# Patient Record
Sex: Male | Born: 1945 | Race: Black or African American | Hispanic: No | Marital: Single | Smoking: Former smoker
Health system: Southern US, Community
[De-identification: ages and names within clinical notes are randomized; demographics above are authoritative.]

## PROBLEM LIST (undated history)

## (undated) DIAGNOSIS — I10 Essential (primary) hypertension: Secondary | ICD-10-CM

## (undated) DIAGNOSIS — F209 Schizophrenia, unspecified: Secondary | ICD-10-CM

## (undated) DIAGNOSIS — E785 Hyperlipidemia, unspecified: Secondary | ICD-10-CM

## (undated) DIAGNOSIS — I219 Acute myocardial infarction, unspecified: Secondary | ICD-10-CM

## (undated) DIAGNOSIS — I6529 Occlusion and stenosis of unspecified carotid artery: Secondary | ICD-10-CM

## (undated) DIAGNOSIS — I2510 Atherosclerotic heart disease of native coronary artery without angina pectoris: Secondary | ICD-10-CM

## (undated) HISTORY — DX: Essential (primary) hypertension: I10

## (undated) HISTORY — PX: HERNIA REPAIR: SHX51

## (undated) HISTORY — DX: Hyperlipidemia, unspecified: E78.5

## (undated) HISTORY — PX: TONSILLECTOMY AND ADENOIDECTOMY: SHX28

## (undated) HISTORY — PX: CARDIAC CATHETERIZATION: SHX172

## (undated) HISTORY — DX: Schizophrenia, unspecified: F20.9

## (undated) HISTORY — DX: Atherosclerotic heart disease of native coronary artery without angina pectoris: I25.10

## (undated) HISTORY — DX: Occlusion and stenosis of unspecified carotid artery: I65.29

---

## 2005-03-27 HISTORY — PX: CORONARY ARTERY BYPASS GRAFT: SHX141

## 2006-02-02 ENCOUNTER — Inpatient Hospital Stay (HOSPITAL_BASED_OUTPATIENT_CLINIC_OR_DEPARTMENT_OTHER): Admission: RE | Admit: 2006-02-02 | Discharge: 2006-02-02 | Payer: Self-pay | Admitting: Cardiology

## 2006-02-08 ENCOUNTER — Encounter: Payer: Self-pay | Admitting: Vascular Surgery

## 2006-02-08 ENCOUNTER — Ambulatory Visit (HOSPITAL_COMMUNITY): Admission: RE | Admit: 2006-02-08 | Discharge: 2006-02-08 | Payer: Self-pay | Admitting: Cardiothoracic Surgery

## 2006-02-09 ENCOUNTER — Emergency Department (HOSPITAL_COMMUNITY): Admission: EM | Admit: 2006-02-09 | Discharge: 2006-02-09 | Payer: Self-pay | Admitting: Emergency Medicine

## 2006-02-13 ENCOUNTER — Inpatient Hospital Stay (HOSPITAL_COMMUNITY): Admission: RE | Admit: 2006-02-13 | Discharge: 2006-02-21 | Payer: Self-pay | Admitting: Cardiothoracic Surgery

## 2007-06-25 ENCOUNTER — Emergency Department (HOSPITAL_COMMUNITY): Admission: EM | Admit: 2007-06-25 | Discharge: 2007-06-26 | Payer: Self-pay | Admitting: Emergency Medicine

## 2007-11-07 IMAGING — CR DG CHEST 1V PORT
1 series · 1 of 1 positions shown · non-contrast
Comparison: 02/14/2006

CLINICAL DATA: Coronary artery disease

[view not recorded]
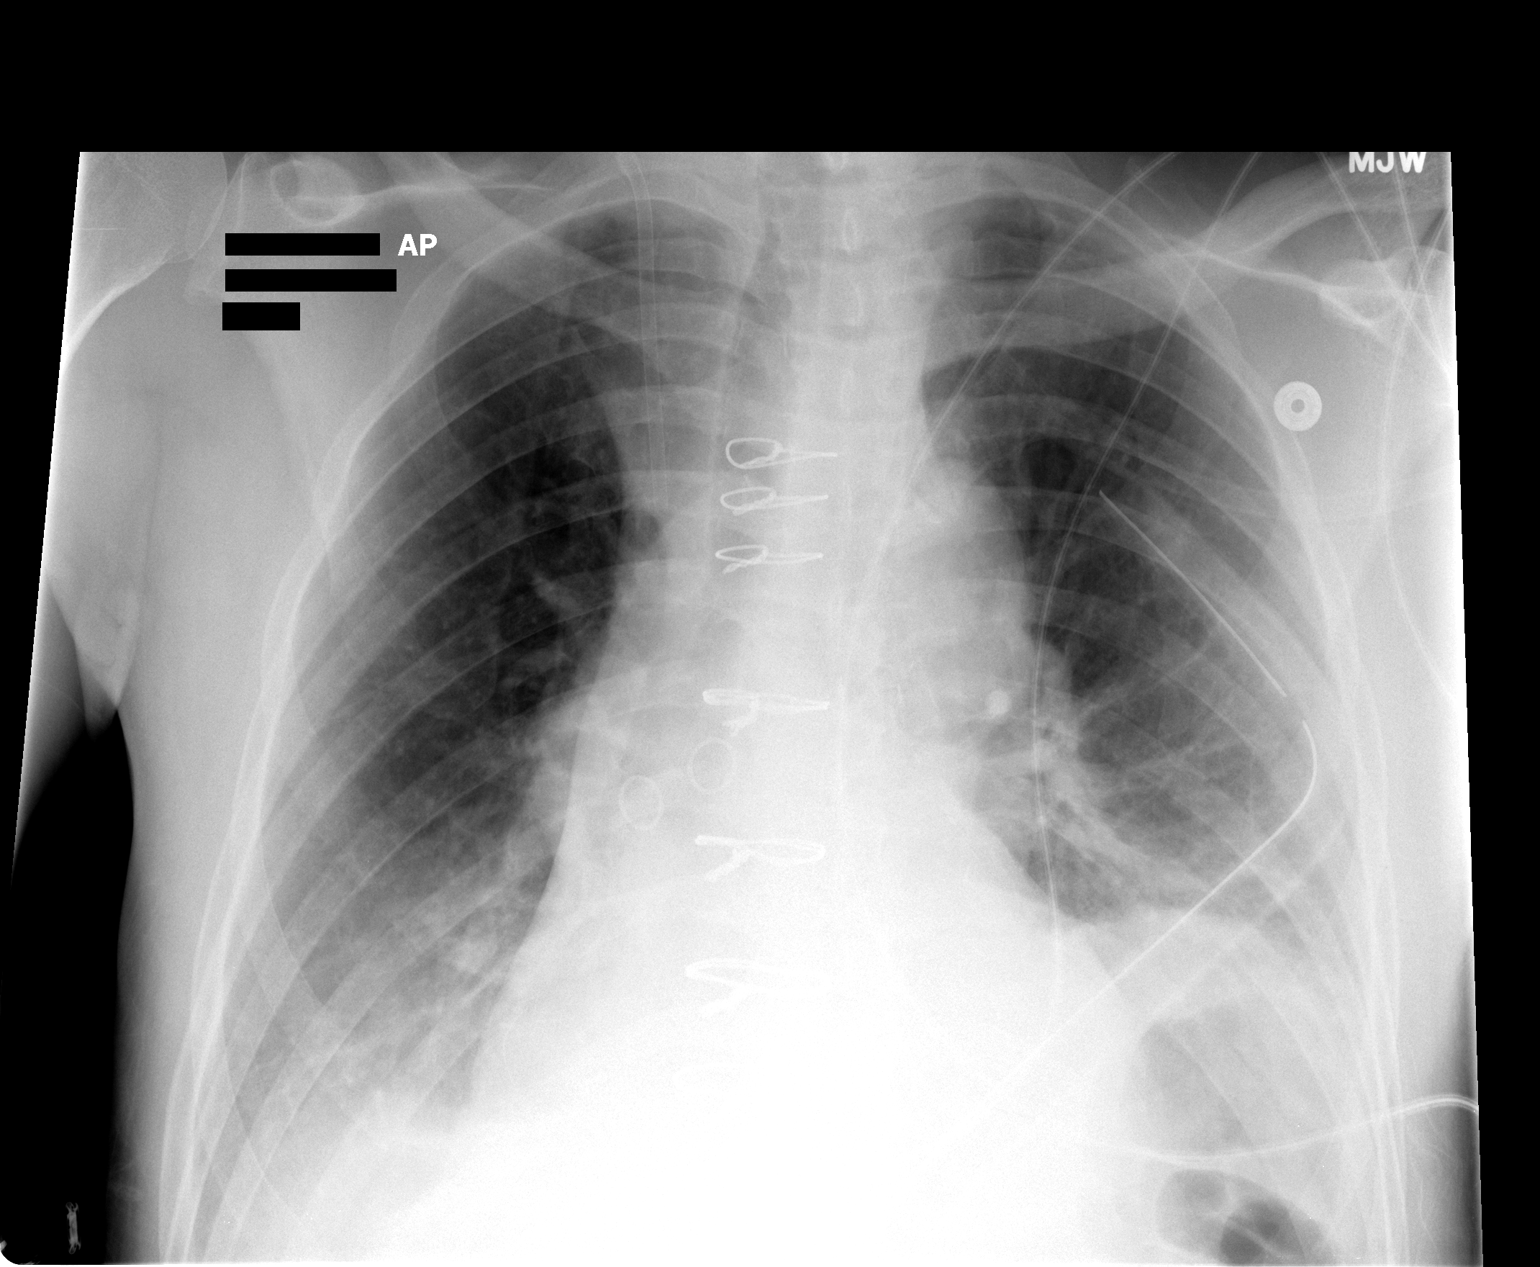

[1 of 1 positions shown; findings below may reference images not displayed]

PORTABLE CHEST - 1 VIEW:

5975 hours. The cardiopericardial silhouette is enlarged there is persistent
bibasilar atelectasis. Left chest tube without pneumothorax.  Mediastinal drains
and pulmonary artery catheter been removed in the interval. Gastric bubble is
decreased. A right IJ sheath remains in place.
IMPRESSION: No evidence for pneumothorax. There is  scarring in the left apex, as seen on
the preoperative film from 02/12/2006.

Bibasilar atelectasis.

Removal of support apparatus as described.

## 2007-11-11 IMAGING — CR DG CHEST 2V
2 series · 2 of 2 positions shown · non-contrast
Comparison: 02/15/06

CLINICAL DATA: Status-post CABG.
 CHEST - 2 VIEW:

[view not recorded (1 of 2)]
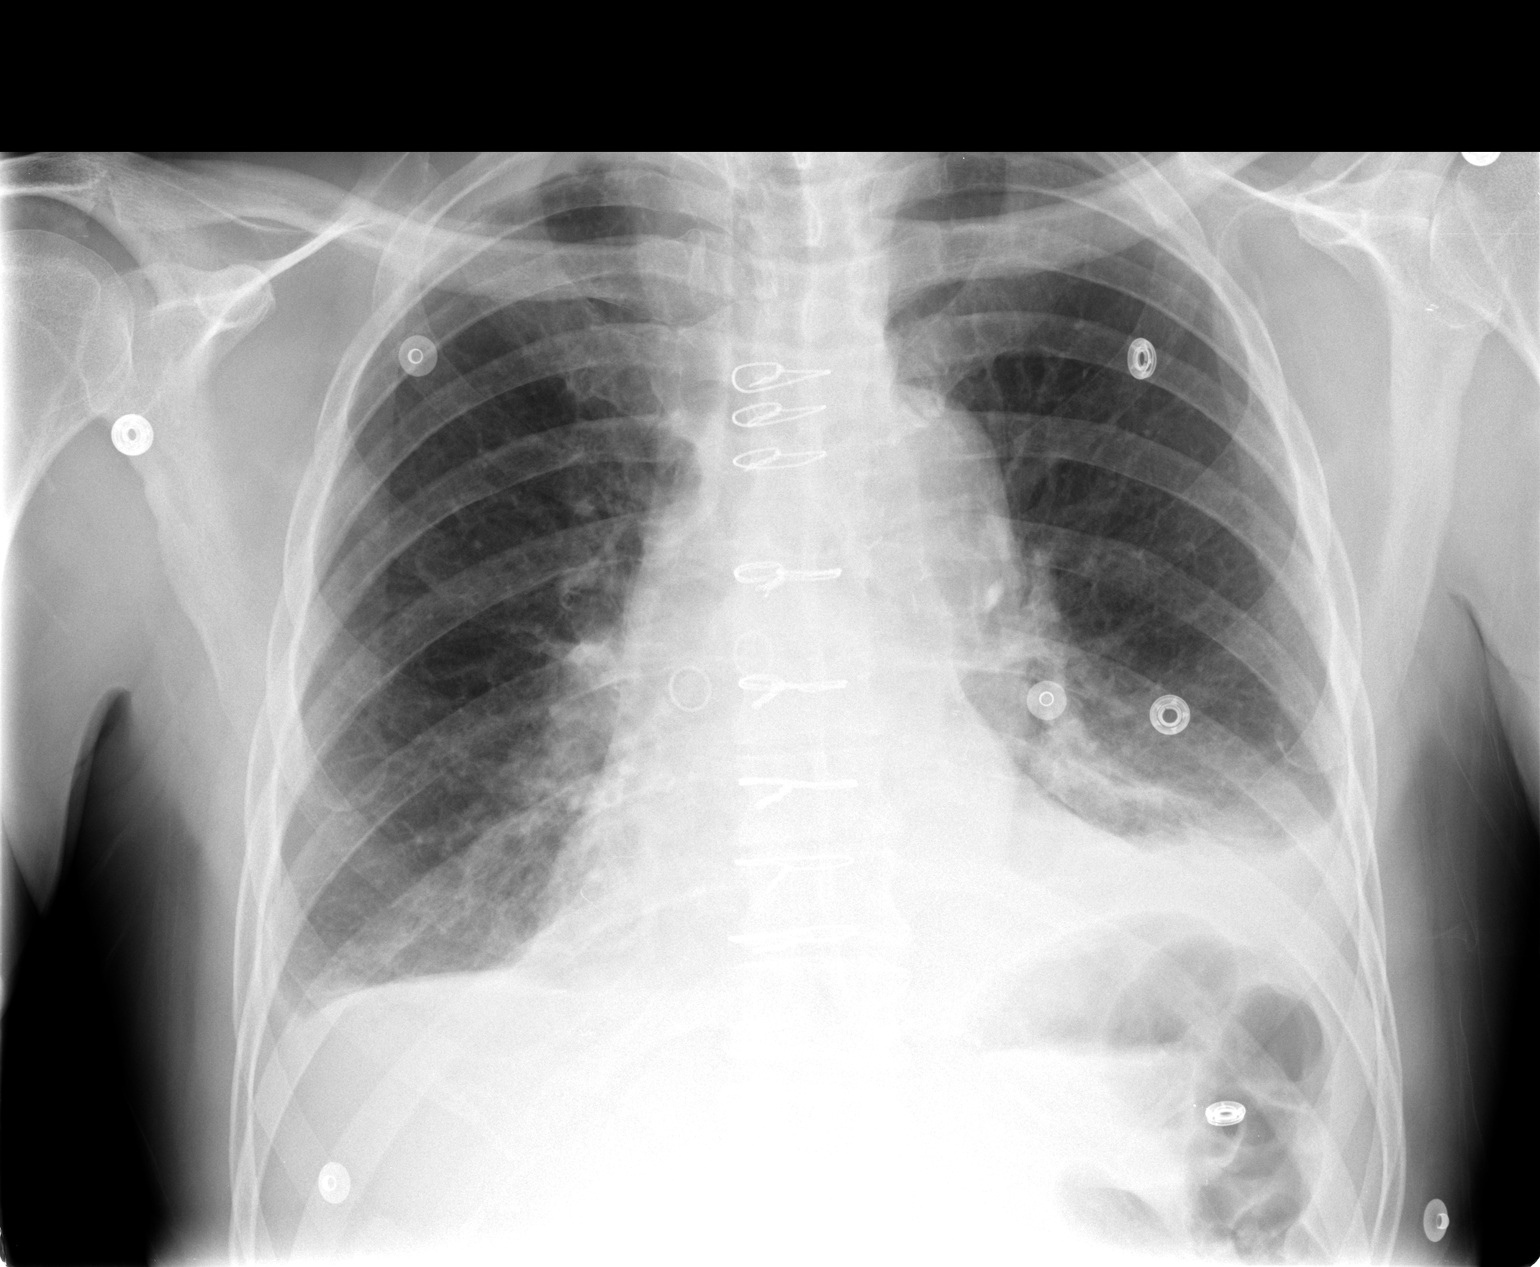

[view not recorded (2 of 2)]
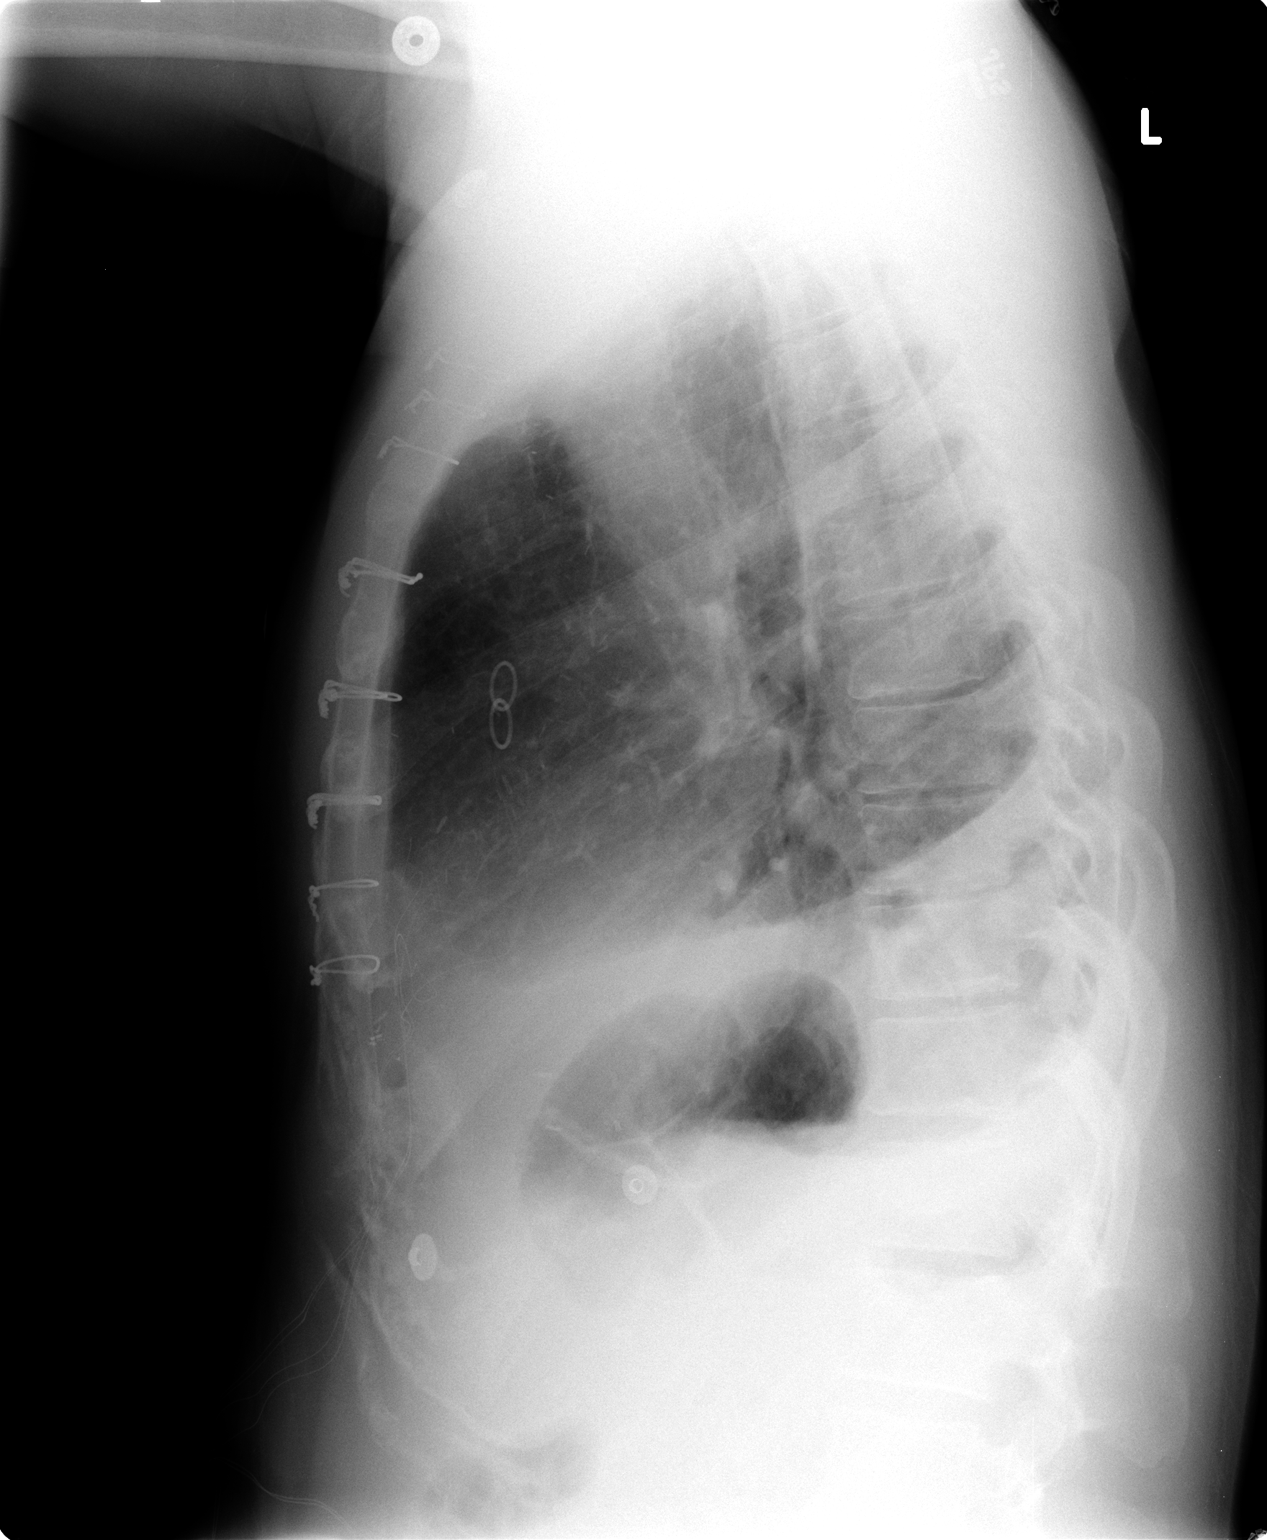

[2 of 2 positions shown; findings below may reference images not displayed]

FINDINGS: The left chest tube has been removed.  No further pneumothorax is seen.  There are small bilateral pleural effusions, left greater than right. Bibasilar atelectasis is also present, left greater than right.
IMPRESSION: No pneumothorax.  Bilateral pleural effusions, left greater than right with associated bibasilar atelectasis.

## 2008-07-23 ENCOUNTER — Encounter: Payer: Self-pay | Admitting: Endocrinology

## 2008-08-13 ENCOUNTER — Ambulatory Visit: Payer: Self-pay | Admitting: Endocrinology

## 2008-08-13 DIAGNOSIS — I2510 Atherosclerotic heart disease of native coronary artery without angina pectoris: Secondary | ICD-10-CM | POA: Insufficient documentation

## 2008-08-13 DIAGNOSIS — I10 Essential (primary) hypertension: Secondary | ICD-10-CM | POA: Insufficient documentation

## 2008-08-13 DIAGNOSIS — E0590 Thyrotoxicosis, unspecified without thyrotoxic crisis or storm: Secondary | ICD-10-CM | POA: Insufficient documentation

## 2008-08-13 DIAGNOSIS — E7800 Pure hypercholesterolemia, unspecified: Secondary | ICD-10-CM | POA: Insufficient documentation

## 2008-08-13 LAB — CONVERTED CEMR LAB
Free T4: 1.3 ng/dL (ref 0.6–1.6)
TSH: <0.01 microintl units/mL — ABNORMAL LOW (ref 0.35–5.50)

## 2008-08-26 ENCOUNTER — Encounter (HOSPITAL_COMMUNITY): Admission: RE | Admit: 2008-08-26 | Discharge: 2008-09-23 | Payer: Self-pay | Admitting: Endocrinology

## 2009-01-09 ENCOUNTER — Emergency Department (HOSPITAL_COMMUNITY): Admission: EM | Admit: 2009-01-09 | Discharge: 2009-01-09 | Payer: Self-pay | Admitting: Emergency Medicine

## 2010-02-09 ENCOUNTER — Ambulatory Visit: Payer: Self-pay | Admitting: Cardiology

## 2010-04-26 NOTE — Assessment & Plan Note (Signed)
Summary: NEW ENDO / THYROID / MEDICARE,MEDICAID/RS'D FROM 5/6/CD   Vital Signs:  Patient profile:   65 year old male Height:      72 inches Weight:      192 pounds BMI:     26.13 Temp:     96.5 degrees F oral Pulse rate:   61 / minute BP sitting:   112 / 80  (left arm) Cuff size:   regular  Vitals Entered By: Bill Salinas CMA (Aug 13, 2008 9:56 AM) CC: pt co itching on both sides of his neck running down his jaw line, pt states it is internal/ ab   Referring Provider:  Dr  Florentina Jenny Primary Provider:  Dr  Florentina Jenny  CC:  pt co itching on both sides of his neck running down his jaw line and pt states it is internal/ ab.  History of Present Illness: pt was recently noted to have abnormal tsh.  he is unaware of any h/o thyroid problem. symptomatically, pt states 2 years of moderate itching of the anterior neck, but no associated pain.  Current Medications (verified): 1)  Adprin B 325 Mg Tabs (Aspirin Buf(Cacarb-Mgcarb-Mgo)) .Marland Kitchen.. 1 Tab Qd 2)  Ativan 1 Mg Tabs (Lorazepam) .Marland Kitchen.. 1 Tab Qd 3)  Benicar 20 Mg Tabs (Olmesartan Medoxomil) .Marland Kitchen.. 1 Tab Qhs 4)  Centrum Silver  Tabs (Multiple Vitamins-Minerals) .Marland Kitchen.. 1 Tab Daily 5)  Flexeril 10 Mg Tabs (Cyclobenzaprine Hcl) .Marland Kitchen.. 1 Tab Two Times A Day 6)  Cvs Ibuprofen 200 Mg Caps (Ibuprofen) .... 2 Tab By Mouth Q4hrs As Needed 7)  Imodium A-D 2 Mg Tabs (Loperamide Hcl) .Marland Kitchen.. 1 Tab Two Times A Day As Needed 8)  Lipitor 20 Mg Tabs (Atorvastatin Calcium) .Marland Kitchen.. 1 Tab Qd 9)  Metoprolol Tartrate 25 Mg Tabs (Metoprolol Tartrate) .Marland Kitchen.. 1 Tab Two Times A Day 10)  Nitro-Dur 0.4 Mg/hr Pt24 (Nitroglycerin) .Marland Kitchen.. 1 Tab Sublingual As Needed 11)  Thioridazine Hcl 25 Mg Tabs (Thioridazine Hcl) .Marland Kitchen.. 1 By Mouth At Bedtime 12)  Triamcinolone Acetonide 0.1 % Lotn (Triamcinolone Acetonide) .... Prn  Allergies (verified): No Known Drug Allergies  Past History:  Family History:    Reviewed history and no changes required:       no thyroid dz  Social  History:    Reviewed history and no changes required:       disabled       divorced  Review of Systems       denies weight loss, headache, hoarseness, double vision, palpitations, sob, diarrhea, myalgias, excessive diaphoresis, numbness, tremor,  hypoglycemia, rhinorrhea.  he has intermittent polyuria, east bruising, and anxiety.   Physical Exam  General:  normal appearance.   Head:  head: no deformity eyes: no periorbital swelling, no proptosis external nose and ears are normal mouth: no lesion seen  Neck:  Supple without thyroid enlargement or tenderness. No cervical lymphadenopathy, neck masses or tracheal deviation.  Lungs:  Clear to auscultation bilaterally. Normal respiratory effort.  Heart:  Regular rate and rhythm without murmurs or gallops noted. Normal S1,S2.   Msk:  muscle bulk and strength are grossly mormal.  no obvious joint swelling.  gait is normal and steady  Pulses:  dorsalis pedis intact bilat.   Extremities:  no deformity.  no ulcer on the feet.  feet are of normal color and temp.   there is bilateral onychomycosis 1+ right pedal edema and 1+ left pedal edema.   Neurologic:  cn 2-12 grossly intact.   readily moves all 4's.  sensation is intact to touch on the feet  Skin:  normal texture and temp.  no rash.  not diaphoretic n ecchymoses Cervical Nodes:  No significant adenopathy.  Psych:  Alert and cooperative; normal mood and affect; normal attention span and concentration.   Additional Exam:  outside test results are reviewed:  07/23/08: tsh=0.01   Impression & Recommendations:  Problem # 1:  HYPERTHYROIDISM (ICD-242.90)  Problem # 2:  pruritis could be caused by #1  Problem # 3:  easy bruising not related to #1  Medications Added to Medication List This Visit: 1)  Adprin B 325 Mg Tabs (Aspirin buf(cacarb-mgcarb-mgo)) .Marland Kitchen.. 1 tab qd 2)  Ativan 1 Mg Tabs (Lorazepam) .Marland Kitchen.. 1 tab qd 3)  Benicar 20 Mg Tabs (Olmesartan medoxomil) .Marland Kitchen.. 1 tab qhs 4)   Centrum Silver Tabs (Multiple vitamins-minerals) .Marland Kitchen.. 1 tab daily 5)  Flexeril 10 Mg Tabs (Cyclobenzaprine hcl) .Marland Kitchen.. 1 tab two times a day 6)  Cvs Ibuprofen 200 Mg Caps (Ibuprofen) .... 2 tab by mouth q4hrs as needed 7)  Imodium A-d 2 Mg Tabs (Loperamide hcl) .Marland Kitchen.. 1 tab two times a day as needed 8)  Lipitor 20 Mg Tabs (Atorvastatin calcium) .Marland Kitchen.. 1 tab qd 9)  Metoprolol Tartrate 25 Mg Tabs (Metoprolol tartrate) .Marland Kitchen.. 1 tab two times a day 10)  Nitro-dur 0.4 Mg/hr Pt24 (Nitroglycerin) .Marland Kitchen.. 1 tab sublingual as needed 11)  Thioridazine Hcl 25 Mg Tabs (Thioridazine hcl) .Marland Kitchen.. 1 by mouth at bedtime 12)  Triamcinolone Acetonide 0.1 % Lotn (Triamcinolone acetonide) .... Prn  Other Orders: Radiology Referral (Radiology) TLB-TSH (Thyroid Stimulating Hormone) (84443-TSH) TLB-T4 (Thyrox), Free [phone]) Consultation Level IV (91478)  Patient Instructions: 1)  we discussed the causes, risks, and treatment options of hyperthyroidism 2)  check nuclear medicine scan

## 2010-06-10 ENCOUNTER — Ambulatory Visit: Payer: Self-pay | Admitting: Cardiology

## 2010-06-30 LAB — URINALYSIS, ROUTINE W REFLEX MICROSCOPIC
Bilirubin Urine: NEGATIVE
Glucose, UA: NEGATIVE mg/dL
Hgb urine dipstick: NEGATIVE
Ketones, ur: NEGATIVE mg/dL
Nitrite: NEGATIVE
Protein, ur: NEGATIVE mg/dL
Specific Gravity, Urine: 1.029 (ref 1.005–1.030)
Urobilinogen, UA: 0.2 mg/dL (ref 0.0–1.0)
pH: 5 (ref 5.0–8.0)

## 2010-06-30 LAB — HEMOCCULT GUIAC POC 1CARD (OFFICE): Fecal Occult Bld: NEGATIVE

## 2010-07-11 ENCOUNTER — Other Ambulatory Visit: Payer: Self-pay | Admitting: *Deleted

## 2010-07-11 DIAGNOSIS — E7800 Pure hypercholesterolemia, unspecified: Secondary | ICD-10-CM

## 2010-07-13 ENCOUNTER — Encounter: Payer: Self-pay | Admitting: Cardiology

## 2010-07-14 ENCOUNTER — Ambulatory Visit (INDEPENDENT_AMBULATORY_CARE_PROVIDER_SITE_OTHER): Payer: Medicare Other | Admitting: Cardiology

## 2010-07-14 ENCOUNTER — Encounter: Payer: Self-pay | Admitting: Cardiology

## 2010-07-14 ENCOUNTER — Other Ambulatory Visit (INDEPENDENT_AMBULATORY_CARE_PROVIDER_SITE_OTHER): Payer: Medicare Other | Admitting: *Deleted

## 2010-07-14 DIAGNOSIS — E7800 Pure hypercholesterolemia, unspecified: Secondary | ICD-10-CM

## 2010-07-14 DIAGNOSIS — I2510 Atherosclerotic heart disease of native coronary artery without angina pectoris: Secondary | ICD-10-CM

## 2010-07-14 LAB — LIPID PANEL
Cholesterol: 127 mg/dL (ref 0–200)
HDL: 50.8 mg/dL (ref >39.00)
LDL Cholesterol: 65 mg/dL (ref 0–99)
Total CHOL/HDL Ratio: 3
Triglycerides: 55 mg/dL (ref 0.0–149.0)
VLDL: 11 mg/dL (ref 0.0–40.0)

## 2010-07-14 LAB — HEPATIC FUNCTION PANEL
ALT: 46 U/L (ref 0–53)
AST: 37 U/L (ref 0–37)
Albumin: 3.9 g/dL (ref 3.5–5.2)
Alkaline Phosphatase: 56 U/L (ref 39–117)
Bilirubin, Direct: 0.2 mg/dL (ref 0.0–0.3)
Total Bilirubin: 1 mg/dL (ref 0.3–1.2)
Total Protein: 6.6 g/dL (ref 6.0–8.3)

## 2010-07-14 LAB — BASIC METABOLIC PANEL
BUN: 11 mg/dL (ref 6–23)
CO2: 29 mEq/L (ref 19–32)
Calcium: 9.4 mg/dL (ref 8.4–10.5)
Chloride: 105 mEq/L (ref 96–112)
Creatinine, Ser: 1.4 mg/dL (ref 0.4–1.5)
GFR: 67.72 mL/min (ref >60.00)
Glucose, Bld: 74 mg/dL (ref 70–99)
Potassium: 4.5 mEq/L (ref 3.5–5.1)
Sodium: 141 mEq/L (ref 135–145)

## 2010-07-14 NOTE — Assessment & Plan Note (Signed)
This pleasant gentleman has known coronary artery disease.  He had a history of a remote inferior wall myocardial infarction.  That was in 1987.  He had a recurrence of chest pain in 2007 and underwent coronary artery bypass graft surgery.  He's not had any recurrent chest discomfort or angina.  He walks daily for exercise.  He denies palpitations or shortness of breath.

## 2010-07-14 NOTE — Assessment & Plan Note (Signed)
The patient has a history of hypercholesterolemia.  He is on generic Lipitor 20 mg daily.  He is not having any myalgias from the Lipitor.  He does have occasional leg cramps.

## 2010-07-14 NOTE — Progress Notes (Signed)
Gilbert Harris Date of Birth:  1945-10-26 [name] / [facility] [address]   [address] [place], [place]  [address] [phone]           Fax   [phone]  HPI: This pleasant 65 year old gentleman has known ischemic heart disease and history of hypercholesterolemia.  Is a former smoker having quit in 2007.  He had his first heart attack which was an inferior wall MI in 1987.  In 2007 he underwent coronary artery bypass graft surgery for unstable angina.  His last nuclear study was October 2009 and showed an old inferolateral scar but no reversible ischemia.  Patient denies any recent chest pain or angina.  He also has a history of essential hypertension and is on metoprolol tartrate and Avapro  Current Outpatient Prescriptions  Medication Sig Dispense Refill  . aspirin 325 MG tablet Take 325 mg by mouth daily.        . irbesartan (AVAPRO) 150 MG tablet Take 150 mg by mouth at bedtime.        Marland Kitchen LORazepam (ATIVAN) 1 MG tablet Take 1 mg by mouth daily.        . metoprolol tartrate (LOPRESSOR) 25 MG tablet Take 25 mg by mouth 2 (two) times daily.        . Multiple Vitamin (MULTIVITAMIN) tablet Take 1 tablet by mouth daily.        . nitroGLYCERIN (NITROSTAT) 0.4 MG SL tablet Place 0.4 mg under the tongue every 5 (five) minutes as needed.        . thioridazine (MELLARIL) 25 MG tablet Take 25 mg by mouth 2 (two) times daily.          Allergies  Allergen Reactions  . Haldol Decanoate     Patient Active Problem List  Diagnoses  . HYPERTHYROIDISM  . HYPERCHOLESTEROLEMIA  . UNSPECIFIED ESSENTIAL HYPERTENSION  . CAD    History  Smoking status  . Former Smoker  Smokeless tobacco  . Not on file    History  Alcohol Use: Not on file    No family history on file.  Review of Systems: The patient denies any heat or cold intolerance.  No weight gain or weight loss.  The patient denies headaches or blurry vision.  There is no cough or sputum production.  The patient  denies dizziness.  There is no hematuria or hematochezia.  The patient denies any muscle aches or arthritis.  The patient denies any rash.  The patient denies frequent falling or instability.  There is no history of depression or anxiety.  All other systems were reviewed and are negative.   Physical Exam: Filed Vitals:   07/14/10 1136  BP: 110/72  Pulse: 66   Weight is 194, up 1 pound. Pupils equal and reactive.   Extraocular Movements are full.  There is no scleral icterus.  The mouth and pharynx are normal.  The neck is supple.  The carotids reveal no bruits.  The jugular venous pressure is normal.  The thyroid is not enlarged.  There is no lymphadenopathy.The chest is clear to percussion and auscultation. There are no rales or rhonchi. Expansion of the chest is symmetrical.The precordium is quiet.  The first heart sound is normal.  The second heart sound is physiologically split.  There is no murmur gallop rub or click.  There is no abnormal lift or heave.The abdomen is soft and nontender. Bowel sounds are normal. The liver and spleen are not enlarged. There Are no  abdominal masses. There are no bruits.  Normal extremity without phlebitis or edema.  Pedal pulses are good.  He has had some occasional left calf pain but examination of the leg is unremarkable.    Assessment / Plan: Continue same medication.  Blood work today is pending.  Recheck in 4 months for followup office visit.

## 2010-07-18 ENCOUNTER — Telehealth: Payer: Self-pay | Admitting: Cardiology

## 2010-07-18 DIAGNOSIS — E7800 Pure hypercholesterolemia, unspecified: Secondary | ICD-10-CM

## 2010-07-18 MED ORDER — ATORVASTATIN CALCIUM 20 MG PO TABS: 20.0000 mg | ORAL_TABLET | Freq: Every day | ORAL | Status: DC

## 2010-07-18 NOTE — Telephone Encounter (Signed)
NEEDS A  SCRIPT FOR A TORVASTATIN 20MG    HE WILL COME BY TO PICK UP

## 2010-07-18 NOTE — Telephone Encounter (Signed)
Not on med sheet, patient seen recently and med was in Dr. Jenness Corner dictation.

## 2010-08-12 NOTE — Discharge Summary (Addendum)
NAME:  Gilbert Harris, Gilbert Harris NO.:  [id number]   MEDICAL RECORD NO.:  [id number]          PATIENT TYPE:  INP   LOCATION:  2016                         FACILITY:  [facility]   PHYSICIAN:  Kerin Perna, M.D.  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   IN HOSPITAL DIAGNOSES:  1. Acute blood loss anemia postoperatively.  2. Postoperative hypotension.  3. Volume overload postoperatively.   SECONDARY DIAGNOSES:  1. Hypertension.  2. Hyperlipidemia.  3. Schizophrenia.  4. Status post chest surgery for mandibular fracture at age 42.   OPERATIONS AND PROCEDURES:  Coronary artery bypass grafting x4 using a left  internal mammary artery to left anterior descending, saphenous vein graft to  right coronary artery, sequential saphenous vein graft to diagonal and  obtuse marginal #1.   THE PATIENT'S HISTORY AND PHYSICAL AND HOSPITAL COURSE:  The patient is a 65-  year-old male who presented to Dr. Patty Sermons with symptoms of exertional  angina.  Stress test was done and showed to be positive.  The patient was  then taken by Dr. Swaziland for cardiac catheterization which demonstrates  severe coronary disease with 90% stenosis of the LAD, 80% stenosis of the  diagonal, 90% stenosis of the obtuse marginal #1.  There is 50% stenosis  diffusely diseased right coronary artery.  Ejection fraction was preserved.  Following catheterization, Dr. Donata Clay was consulted.  Dr. Donata Clay saw  and evaluated the patient.  He discussed with the patient undergoing  coronary bypass grafting.  Risks and benefits were discussed with the  patient.  The patient acknowledged understanding and agreed to proceed.  Surgery was scheduled for February 21, 2006.  For details of the patient's  past medical history and  physical exam, please see dictated History and  Physical.   The patient was taken to the operating room February 13, 2006, where he  underwent coronary bypass grafting x4 using a left internal mammary artery  to left anterior descending, saphenous vein graft to right coronary artery,  sequential saphenous vein graft to diagonal and obtuse marginal #1.  The  patient tolerated this procedure well and transferred to the intensive care  unit in stable condition.  Immediately following surgery, the patient was  seen to be hemodynamically stable.  He was extubated the evening of surgery.  Following extubation, seen to be alert and oriented x4, neurologically  intact.  The patient's vital signs were noted to be stable postop day #1.  Chest tubes had minimal output and were discontinued.  Was able to be weaned  off at that time but was required to be restarted due to the patient's  systolic blood pressure being in the 80s on postop day #2.   On day #3, the patient was noted to have acute blood loss anemia with  hemoglobin 8.6 and hematocrit 25.4.  Also, he continued to require ____ QA  MARKER: 186___ for maintaining systolic blood pressure greater than 100  At that time, it was felt best for the patient to receive 1 unit of packed  red blood cells.  Following transfusion, hemoglobin and hematocrit increased  appropriately.  The patient was able to be weaned off this.  By postop day  #4, he was transferred to 2000.  During the patient's postoperative course,  he was noted to be afebrile.  He was able to be weaned off oxygen,  saturating greater than 90% on room air.  He remained in normal sinus  rhythm.  Postop chest x-rays were stable.  No pneumonia or pneumothorax  noted.  Incisions were clean, dry and intact and healing well.  The patient  was out of bed ambulating well.  He was tolerating regular diet well.  No  nausea, vomiting noted.   Bowel movements within normal limits.   The patient is tentatively ready for discharge home in the next 1-2 days.   FOLLOWUP APPOINTMENTS:  A followup appointment will be arranged with Dr. Donata Clay in 3 weeks.  Our office will contact the patient with this  information.  The patient will need to contact Dr. Yevonne Pax office to  schedule followup appointment with him in 2 weeks.   ACTIVITY:  The patient was instructed no driving until to released to do so,  no lifting over 10 pounds.  He is told to ambulate 3-4 times per day,  progress as tolerated, and to continue breathing exercises.   INCISIONAL CARE:  The patient is told to shower, washing his incisions using  soap and water.  He is to contact the office if he develops any drainage or  opening from any of his incision sites.   DIET:  The patient was instructed on diet to be low-fat, low-salt.   DISCHARGE MEDICATIONS:  1. Lopressor 25 mg b.i.d.  2. Lipitor 20 mg at night.  3. Mellaril 25 mg at night.  4. Multivitamin daily.  5. Oxycodone 5 mg 1-2 tablets q.4-6 h p.r.n. pain.      Theda Belfast, [place]      Kerin Perna, M.D.  Electronically Signed    KMD/MEDQ  D:  02/18/2006  T:  02/18/2006  Job:  [id number]   cc:   Cassell Clement, M.D.

## 2010-08-12 NOTE — H&P (Signed)
NAME:  Gilbert Harris, Gilbert Harris NO.:  [id number]   MEDICAL RECORD NO.:  [id number]            PATIENT TYPE:   LOCATION:                                 FACILITY:   PHYSICIAN:  Peter M. Swaziland, M.D.  DATE OF BIRTH:  27-Feb-1946   DATE OF ADMISSION:  02/02/2006  DATE OF DISCHARGE:                                HISTORY & PHYSICAL   HISTORY OF PRESENT ILLNESS:  Mr. Ciresi is a 65 year old black male who has a  known history of coronary artery disease.  He suffered inferior posterior  myocardial infarction in 1987.  Cardiac catheterization at that time  demonstrated subtotal occlusion of the left circumflex coronary artery  proximally.  He was also noted to have 40 to 50% stenosis in the mid portion  of the LAD.  There was a 50 to 60% stenosis in the mid-right coronary  artery.  He had diaphragmatic hypokinesia with overall ejection fraction of  40 to 50%.  He was treated medically at that time and has done very well.  Over the past year however, he has noticed symptoms of intermittent  substernal chest pain with exertion.  It is localized to the left precordial  area.  It is typically relieved with rest.  Prior to a year ago, he had no  anginal symptoms.  He recently underwent an adenosine Cardiolite study.  This showed predominantly a fixed inferolateral defect with some mild  reversible ischemia.  His ejection fraction was 49%.  Because of his  symptoms and stress test results, it is recommended he undergo cardiac  catheterization.   PAST MEDICAL HISTORY:  1. He has a history of hypertension.  2. He has a history of hypercholesterolemia.  3. He has had some benign lipomas.  4. He had surgery on his jaw at age 59.  He suffered a fracture.  5. The patient does have a history of schizophrenia.   CURRENT MEDICATIONS:  1. Ecotrin daily.  2. Centrum Silver daily.  3. Nitroglycerin p.r.n.  4. __________ 25 mg q.h.s.  5. Metoprolol succinate 50 mg per day.  6. Lipitor 10 mg per  day.   ALLERGIES:  He has no known allergies.   FAMILY HISTORY:  His family history is strongly positive for coronary  disease.  His father died with heart disease and diabetes.  His mother and  sister both had bypass surgery.   SOCIAL HISTORY:  The patient states he retired in his 67's, currently living  in a group home.  He was divorced 30 years ago and has two grown sons from  that marriage.  He does smoke a half a pack per day.  He rarely drinks  alcohol.   REVIEW OF SYSTEMS:  He denies any PND, orthopnea, or edema.  He denies any  claudication symptoms.  He has had no history of TIA or stroke.  No recent  bowel or bladder complaints.  Other review of systems are negative.   PHYSICAL EXAMINATION:  The patient is a pleasant black male in no distress.  His weight is 172.  Blood  pressure is 122/90, pulse 72 and regular.  HEENT:  He is balding.  He is normocephalic, atraumatic.  He does have a  small lipoma on his forehead.  His pupils are equal, round, and reactive to  light and accommodation.  Extraocular movements are full.  Oropharynx is  clear.  NECK:  Without JVD, adenopathy, thyromegaly, or bruits.  LUNGS:  Clear.  CARDIAC:  Regular rate and rhythm, normal S1 and S2 without gallop, murmur,  rub, or click.  ABDOMEN:  Soft and nontender.  There is no hepatosplenomegaly, masses, or  bruits.  Femoral and pedal pulses are 2+ and symmetric.  There is no edema  or cyanosis.  NEUROLOGIC:  Nonfocal.   LABORATORY DATA:  ECG at rest shows sinus bradycardia with nonspecific T  wave abnormalities.  His chest x-ray showed no active disease.  Blood work  is pending.   IMPRESSION:  1. Angina pectoris with abnormal adenosine Cardiolite study.  2. Remote inferolateral myocardial infarction in 1987.  3. Hypertension.  4. Hypercholesterolemia.  5. Schizophrenia.  6. Tobacco abuse.   PLAN:  The patient will undergo diagnostic cardiac catheterization with  further therapy pending these  results.           ______________________________  Peter M. Swaziland, M.D.     PMJ/MEDQ  D:  01/31/2006  T:  01/31/2006  Job:  [id number]   cc:   Cassell Clement, M.D.

## 2010-08-12 NOTE — Op Note (Signed)
NAME:  Gilbert Harris, Gilbert Harris NO.:  [id number]   MEDICAL RECORD NO.:  [id number]          PATIENT TYPE:  INP   LOCATION:  2311                         FACILITY:  [facility]   PHYSICIAN:  Kerin Perna, M.D.  DATE OF BIRTH:  02/13/1946   DATE OF PROCEDURE:  02/13/2006  DATE OF DISCHARGE:                                 OPERATIVE REPORT   OPERATION:  Coronary artery bypass grafting x4 (left internal mammary artery  to LAD, saphenous vein graft to right coronary artery, sequential saphenous  vein graft to diagonal and OM1).   PREOPERATIVE DIAGNOSIS:  Class III exertional angina, with severe three-  vessel coronary disease.   POSTOPERATIVE DIAGNOSIS:  Class III exertional angina, with severe three-  vessel coronary disease.   SURGEON:  Kerin Perna, M.D.   ASSISTANT:  Pecola Leisure, PA-C.   ANESTHESIA:  General.   INDICATIONS:  The patient is a 65 year old male who presented to his  cardiologist, Dr. Patty Sermons, with symptoms of exertional angina.  A stress  test was positive, and cardiac catheterization performed by Dr. Swaziland  demonstrated severe coronary disease with 90% stenosis of the LAD, 80%  stenosis of the diagonal, and 90% stenosis of the OM1. with a 50% stenosis  of a diffusely diseased right coronary.  His EF was preserved, and he was  felt to be candidate for surgical revascularization.  Prior to surgery. I  examined the patient in the office and reviewed the results of cardiac  catheterization with the patient and his family.  I discussed indications  and expected benefits of coronary artery bypass surgery for treatment of his  coronary artery disease.  I reviewed the alternatives to surgical therapy as  well.  I discussed with the patient the major aspects of the planned  procedure, including the choice of conduit to include the internal mammary  artery and endoscopically harvested saphenous vein, the location of the  surgical incisions, the use of  general anesthesia and cardiopulmonary  bypass, and the expected postoperative hospital recovery.  I also discussed  with the patient the risks to him of coronary artery bypass surgery.  including the risks of MI, CVA, bleeding, blood transfusion requirement,  infection, and death.  He understood these implications for the surgery and  agreed to proceed with the operation as planned under what I felt was an  informed consent.   OPERATIVE FINDINGS:  The patient's proximal coronaries were heavily  calcified.  There was one area of myocardial scarring in the posterolateral  area of the left ventricle.  The patient's ascending aorta was 3.8-4 cm.  The patient was given Amicar for the surgery.  No blood products were  administered during the operation.   PROCEDURE:  The patient was brought to the operating room and placed supine  on the operating table.  General anesthesia was induced under invasive  hemodynamic monitoring.  The chest, abdomen. and legs were prepped with  Betadine and draped as a sterile field.  A sternal incision was made, as the  saphenous vein was harvested endoscopically from the right leg.  It was  dilated and had some varicosities, but it was overall acceptable.  The  internal mammary artery was harvested as a pedicle graft from its origin at  the subclavian vessels.  Heparin was administered, and the ACT was  documented as being therapeutic.  The sternal retractor was placed, and the  pericardium was suspended.  Pursestrings were placed in the ascending aorta  and right atrium, and the patient was cannulated and placed on bypass.  The  coronaries were identified for grafting, and the cardioplegia catheters for  both antegrade and retrograde cardioplegia were placed.  The mammary artery  and vein grafts were prepared for the distal anastomoses, and the patient  was cooled to 32 degrees.   The aortic crossclamp was applied as 800 mL of cold blood cardioplegia was   delivered in split doses between the antegrade aortic and retrograde  coronary sinus catheters.  There was good cardioplegic arrest.   The distal coronary anastomoses were then performed.  The first distal  anastomosis was of the distal right.  This was a 1.8 mm vessel, with a  proximal 50% calcified stenosis.  A reverse saphenous vein was sewn end-to-  side with running 7-0 Prolene, with good flow through graft.  The second  distal anastomosis consisted of a sequential vein graft to the first  diagonal and OM1.  The first diagonal was 1.5 mm vessel, with a proximal 80%  calcified stenosis.  A side-to-side anastomosis with the vein was  constructed using running 7-0 Prolene, and there was good flow through  graft.  The third distal anastomosis was the distal aspect of the sequential  vein graft to the OM1.  This was a 1.5 mm vessel with a proximal 95%  stenosis, and the end of the vein was sewn end-to-side with running 7-0  Prolene, with a good flow through graft.  Cardioplegia was re-dosed.  The  fourth distal anastomosis was to the distal third of the LAD.  There were  diffuse calcified plaques and stenosis, and the distal anastomosis was  placed as distal as the pedicle graft would reach, where there was an  adequate quality vessel to place the anastomosis.  The pedicle was brought  through an opening in the left lateral pericardium and was sewn end-to-side  with running 8-0 Prolene.  There was excellent flow through the anastomosis  after briefly releasing the pedicle bulldog on the mammary pedicle.  The  bulldog was reapplied, and the pedicle was secured to the epicardium.   Cardioplegia was re-dosed.  While crossclamp was still in place, two  proximal vein anastomoses were performed using a 4.5 mm punch and a running  6-0 Prolene.  Prior to tying down the final proximal anastomosis, air was  vented from the coronaries, with a dose of retrograde warm blood  cardioplegia (hot  shot).   The crossclamp was removed, and the heart resumed a spontaneous rhythm.  The  cardioplegia catheters were removed, and air was aspirated from the vein  grafts.  The patient was rewarmed to 37 degrees.  The proximal and distal  anastomoses were checked and found be hemostatic.  Temporary pacing wires  were applied.  The lungs were re-expanded, and the ventilator was resumed.  The patient was weaned from bypass without inotropes.  Blood pressure and  cardiac output were stable.  Protamine was administered without adverse  reaction.  The cannula was removed.  The mediastinum was irrigated with warm  antibiotic irrigation.  The leg incision  was irrigated and closed in a  standard fashion.  The superior pericardial fat was closed over the aorta.  Two mediastinal and a left pleural chest tube were placed and brought out  through separate incisions.  The sternum was closed with interrupted steel  wire.  Pectoralis fascia was closed in a running #1 Vicryl.  The  subcutaneous and skin layers were closed in a running Vicryl, and sterile  dressings were applied.  Total bypass time was 110 minutes, with crossclamp  time of 72 minutes.      Kerin Perna, M.D.  Electronically Signed     PV/MEDQ  D:  02/13/2006  T:  02/13/2006  Job:  [id number]   cc:   CV[facility] Office  Cassell Clement, M.D.

## 2010-08-12 NOTE — Cardiovascular Report (Signed)
NAME:  Gilbert Harris, Gilbert Harris NO.:  [id number]   MEDICAL RECORD NO.:  [id number]          PATIENT TYPE:  OIB   LOCATION:  6501                         FACILITY:  [facility]   PHYSICIAN:  Peter M. Swaziland, M.D.  DATE OF BIRTH:  11-Feb-1946   DATE OF PROCEDURE:  DATE OF DISCHARGE:                              CARDIAC CATHETERIZATION   INDICATIONS FOR PROCEDURE:  This is a 65 year old black male with history of  hypertension, hypercholesterolemia and coronary disease.  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  He has been experiencing symptoms of increased exertional angina over the  past year and had an abnormal stress Cardiolite study.  Access is via the  right femoral artery using the standard Seldinger technique equipment, 4-  French 4 cm right and left Judkins catheter, 4-French pigtail catheter, 4-  [place] are arterial sheath.   MEDICATIONS:  Local anesthesia 1% Xylocaine.  Contrast 90 mL of Omnipaque.   HEMODYNAMIC DATA:  Aortic pressures 129/82 with mean of 102. Left ventricle  pressure is 134 with EDP of 18 mmHg.   ANGIOGRAPHIC DATA:  The left coronary artery arises and distributes  normally.  The left main coronary artery is calcified without significant  disease.   The left anterior descending artery is a large vessel which extends out to  the apex.  It is heavily calcified.  In the proximal vessel prior to the  takeoff of a relatively large diagonal branch there is a 90% stenosis in the  proximal LAD.  The mid LAD is diffusely diseased.  Has another focal segment  of 90% stenosis in the mid-vessel.  There is then a 70 to 80% stenosis at  the mid to distal vessel.  The first diagonal branch has 40-50% stenosis at  its origin.   The left circumflex coronary artery has a 90% stenosis proximally.  It is  then occluded after the first marginal vessel with bridging collaterals in  the distal circumflex.  The first  marginal vessel is also occluded with  bridging collaterals.   The right coronary is a large dominant vessel.  Is also calcified.  It has  diffuse disease in the proximal to mid-vessel to 30%.  There are also minor  irregularities distally.   Left ventricular angiography was performed in the RAO view.  This  demonstrates mild mid inferior hypokinesia with overall low normal systolic  function.  Ejection fraction is estimated 50%.  There is no mitral  regurgitation prolapse.   FINAL INTERPRETATION:  1. Severe two-vessel obstructive atherosclerotic coronary artery disease.  2. Low-normal left ventricle function.   PLAN:  Recommend referral for coronary artery bypass surgery.           ______________________________  Peter M. Swaziland, M.D.     PMJ/MEDQ  D:  02/02/2006  T:  02/03/2006  Job:  [id number]   cc:   Cassell Clement, M.D.

## 2010-09-12 ENCOUNTER — Other Ambulatory Visit: Payer: Self-pay | Admitting: *Deleted

## 2010-09-12 MED ORDER — METOPROLOL TARTRATE 25 MG PO TABS: 25.0000 mg | ORAL_TABLET | Freq: Two times a day (BID) | ORAL | Status: DC

## 2010-09-12 NOTE — Telephone Encounter (Signed)
[facility]ed ref[facility]

## 2010-09-12 NOTE — Telephone Encounter (Signed)
Fax received from pharmacy. Refill completed. Jodette Lidwina Kaner RN  

## 2010-09-14 ENCOUNTER — Other Ambulatory Visit (HOSPITAL_COMMUNITY): Payer: Self-pay | Admitting: *Deleted

## 2010-09-14 MED ORDER — METOPROLOL TARTRATE 25 MG PO TABS: 25.0000 mg | ORAL_TABLET | Freq: Two times a day (BID) | ORAL | Status: DC

## 2010-10-03 ENCOUNTER — Telehealth: Payer: Self-pay | Admitting: Cardiology

## 2010-10-03 NOTE — Telephone Encounter (Signed)
Called wanting to schedule an appointment from his recall. Please call back. I have pulled the chart.

## 2010-10-03 NOTE — Telephone Encounter (Signed)
Patient called again wanting appointment.  Patient would like any morning will work for patient.  Please call.  Patient advised may be tomorrow for call back.

## 2010-10-04 NOTE — Telephone Encounter (Signed)
Scheduled appointment with Lawson Fiscal

## 2010-10-04 NOTE — Telephone Encounter (Signed)
No answer, will try again later.

## 2010-11-02 ENCOUNTER — Encounter: Payer: Self-pay | Admitting: Cardiology

## 2010-11-11 ENCOUNTER — Encounter: Payer: Self-pay | Admitting: Nurse Practitioner

## 2010-11-11 ENCOUNTER — Ambulatory Visit (INDEPENDENT_AMBULATORY_CARE_PROVIDER_SITE_OTHER): Payer: Medicare Other | Admitting: Nurse Practitioner

## 2010-11-11 DIAGNOSIS — E7800 Pure hypercholesterolemia, unspecified: Secondary | ICD-10-CM

## 2010-11-11 DIAGNOSIS — I2510 Atherosclerotic heart disease of native coronary artery without angina pectoris: Secondary | ICD-10-CM

## 2010-11-11 DIAGNOSIS — I10 Essential (primary) hypertension: Secondary | ICD-10-CM

## 2010-11-11 LAB — LIPID PANEL
Cholesterol: 117 mg/dL (ref 0–200)
HDL: 53.5 mg/dL (ref >39.00)
LDL Cholesterol: 49 mg/dL (ref 0–99)
Total CHOL/HDL Ratio: 2
Triglycerides: 73 mg/dL (ref 0.0–149.0)
VLDL: 14.6 mg/dL (ref 0.0–40.0)

## 2010-11-11 LAB — BASIC METABOLIC PANEL
BUN: 13 mg/dL (ref 6–23)
CO2: 30 mEq/L (ref 19–32)
Calcium: 9.7 mg/dL (ref 8.4–10.5)
Chloride: 105 mEq/L (ref 96–112)
Creatinine, Ser: 1.3 mg/dL (ref 0.4–1.5)
GFR: 69.42 mL/min (ref >60.00)
Glucose, Bld: 98 mg/dL (ref 70–99)
Potassium: 4.4 mEq/L (ref 3.5–5.1)
Sodium: 141 mEq/L (ref 135–145)

## 2010-11-11 LAB — HEPATIC FUNCTION PANEL
ALT: 43 U/L (ref 0–53)
AST: 35 U/L (ref 0–37)
Albumin: 4.3 g/dL (ref 3.5–5.2)
Alkaline Phosphatase: 61 U/L (ref 39–117)
Bilirubin, Direct: 0.2 mg/dL (ref 0.0–0.3)
Total Bilirubin: 0.9 mg/dL (ref 0.3–1.2)
Total Protein: 7 g/dL (ref 6.0–8.3)

## 2010-11-11 MED ORDER — NITROGLYCERIN 0.4 MG SL SUBL: 0.4000 mg | SUBLINGUAL_TABLET | SUBLINGUAL | Status: DC | PRN

## 2010-11-11 NOTE — Assessment & Plan Note (Signed)
Labs are checked today.  

## 2010-11-11 NOTE — Patient Instructions (Signed)
We are going to check your labs today We are going to arrange for a repeat stress test I refilled your Nitroglycerin to the drug store. We will see you back in 4 months Let us know if you have any more chest pain.

## 2010-11-11 NOTE — Progress Notes (Signed)
    Gilbert Harris Date of Birth: 14-Feb-1946   History of Present Illness: Gilbert Harris is seen back today for his 4 month check. He is seen for Dr. Patty Sermons. He has known CAD with history of CABG in 2007. He has been doing ok. He has had some chest pain about one month ago that required NTG. It has not recurred. He says it felt like his heart pain. He is active and walks on the treadmill 2 times per week with no problems. His last stress test was in 2009. He is tolerating his medicines.   Current Outpatient Prescriptions on File Prior to Visit  Medication Sig Dispense Refill  . aspirin 325 MG tablet Take 81 mg by mouth daily.       Marland Kitchen atorvastatin (LIPITOR) 20 MG tablet Take 1 tablet (20 mg total) by mouth daily.  90 tablet  3  . LORazepam (ATIVAN) 1 MG tablet Take 1 mg by mouth daily.        Marland Kitchen losartan (COZAAR) 100 MG tablet Take 100 mg by mouth daily.        . metoprolol tartrate (LOPRESSOR) 25 MG tablet Take 1 tablet (25 mg total) by mouth 2 (two) times daily.  180 tablet  3  . Multiple Vitamin (MULTIVITAMIN) tablet Take 1 tablet by mouth daily.        Marland Kitchen thioridazine (MELLARIL) 25 MG tablet Take 25 mg by mouth 2 (two) times daily.        Marland Kitchen DISCONTD: nitroGLYCERIN (NITROSTAT) 0.4 MG SL tablet Place 0.4 mg under the tongue every 5 (five) minutes as needed.          Allergies  Allergen Reactions  . Haloperidol Decanoate     Past Medical History  Diagnosis Date  . Coronary artery disease     CABG x4 LIMA to LAD, SV graft to RCA, sequential saphenous vein graft to diagnal and OM1  . HTN (hypertension)   . Hyperlipidemia   . Schizophrenia     Past Surgical History  Procedure Date  . Cardiac catheterization     EF 50%--severe two vessel obstructive atherosclerotic coronary artery disease--Low normal left ventricle funcrtion  . Coronary artery bypass graft 2007    History  Smoking status  . Former Smoker  Smokeless tobacco  . Not on file    History  Alcohol Use No    Family  History  Problem Relation Age of Onset  . Heart disease Mother   . Heart disease Father   . Heart failure Maternal Aunt     Review of Systems: The review of systems is positive for one lone episode of chest pain. His energy level is ok. He is tolerating his medicines. No shortness of breath.  All other systems were reviewed and are negative.  Physical Exam: BP 130/80  Pulse 60  Ht 6' (1.829 m)  Wt 198 lb (89.812 kg)  BMI 26.85 kg/m2 Patient is very pleasant and in no acute distress. Skin is warm and dry. Color is normal.  HEENT is unremarkable. Normocephalic/atraumatic. PERRL. Sclera are nonicteric. Neck is supple. No masses. No JVD. Lungs are clear. Cardiac exam shows a regular rate and rhythm. Abdomen is soft. Extremities are without edema. Gait and ROM are intact. No gross neurologic deficits noted.  LABORATORY DATA: EKG today shows sinus, rate of 60.  Assessment / Plan:

## 2010-11-11 NOTE — Assessment & Plan Note (Signed)
He has had prior CABG back in 2007. Last nuclear in 2009. Will go ahead and update especially in light of this episode of chest pain one month ago. He is presently feeling ok. I refilled his NTG. He is to let us know if he has return of symptoms. We will tentatively see him back in 4 months. Patient is agreeable to this plan and will call if any problems develop in the interim.

## 2010-11-11 NOTE — Assessment & Plan Note (Signed)
Blood pressure is ok. No change in medicines.  

## 2010-11-14 ENCOUNTER — Telehealth: Payer: Self-pay | Admitting: *Deleted

## 2010-11-14 DIAGNOSIS — E7800 Pure hypercholesterolemia, unspecified: Secondary | ICD-10-CM

## 2010-11-14 NOTE — Telephone Encounter (Signed)
Notified of lab results. To see Dr. Patty Sermons in Dec and will repeat lab; lp,hfp,and bmet in 6 mo.

## 2010-11-14 NOTE — Telephone Encounter (Signed)
Message copied by Lorayne Bender on Mon Nov 14, 2010  3:28 PM ------      Message from: Rosalio Macadamia      Created: Fri Nov 11, 2010  1:08 PM       Ok to report. Labs are satisfactory.  Stay on same medicines. Recheck BMET/HPF/LIPIDS  in 6 months.

## 2010-11-15 ENCOUNTER — Encounter: Payer: Self-pay | Admitting: Nurse Practitioner

## 2010-11-17 ENCOUNTER — Ambulatory Visit (HOSPITAL_COMMUNITY): Payer: Medicare Other | Attending: Cardiology | Admitting: Radiology

## 2010-11-17 VITALS — Ht 72.0 in | Wt 188.0 lb

## 2010-11-17 DIAGNOSIS — R0989 Other specified symptoms and signs involving the circulatory and respiratory systems: Secondary | ICD-10-CM

## 2010-11-17 DIAGNOSIS — R0609 Other forms of dyspnea: Secondary | ICD-10-CM

## 2010-11-17 DIAGNOSIS — R0789 Other chest pain: Secondary | ICD-10-CM

## 2010-11-17 DIAGNOSIS — I2510 Atherosclerotic heart disease of native coronary artery without angina pectoris: Secondary | ICD-10-CM

## 2010-11-17 DIAGNOSIS — I25810 Atherosclerosis of coronary artery bypass graft(s) without angina pectoris: Secondary | ICD-10-CM

## 2010-11-17 MED ORDER — TECHNETIUM TC 99M TETROFOSMIN IV KIT
11.0000 | PACK | Freq: Once | INTRAVENOUS | Status: AC | PRN
  Administered 2010-11-17: 11 via INTRAVENOUS

## 2010-11-17 MED ORDER — SODIUM CHLORIDE 0.9 % IV BOLUS (SEPSIS): 1000.0000 mL | Freq: Once | INTRAVENOUS | Status: DC

## 2010-11-17 MED ORDER — TECHNETIUM TC 99M TETROFOSMIN IV KIT
33.0000 | PACK | Freq: Once | INTRAVENOUS | Status: AC | PRN
  Administered 2010-11-17: 33 via INTRAVENOUS

## 2010-11-17 MED ORDER — REGADENOSON 0.4 MG/5ML IV SOLN
0.4000 mg | Freq: Once | INTRAVENOUS | Status: AC
  Administered 2010-11-17: 0.4 mg via INTRAVENOUS

## 2010-11-17 NOTE — Progress Notes (Signed)
[facility] SITE 3 NUCLEAR MED [address] [place] [address] [phone]  Cardiology Nuclear Med Study  Gilbert Harris is a 65 y.o. male [id number] August 31, 1945   Nuclear Med Background Indication for Stress Test:  Evaluation for Ischemia and Graft Patency History:  '87 MI; '07 Cath:Severe 2 vessel disease, EF=50%>CABG x 4;10/09 MPS: (-) ischemia,old inferolateral scar, EF=58%  Cardiac Risk Factors: Strong Family History - CAD, History of Smoking,Hypertension, and lipids  Symptoms:  Chest Pain, Chest Tightness (last date of chest discomfort 2 weeks ago), Dizziness and Light-Headedness   Nuclear Pre-Procedure Caffeine/Decaff Intake:  None NPO After: 5 pm   Lungs:  Clear IV 0.9% NS with Angio Cath:  20g  IV Site: R Antecubital  IV Started by:  Bonnita Levan, RN  Chest Size (in):  44 Cup Size: n/a  Height: 6' (1.829 m)  Weight:  188 lb (85.276 kg)  BMI:  Body mass index is 25.50 kg/(m^2). Tech Comments:  Held Metoprolol X 24 hrs    Nuclear Med Study 1 or 2 day study: 1 day  Stress Test Type:  Treadmill/Lexiscan  Reading MD: Cassell Clement, MD  Order Authorizing Provider:  Dr. Cassell Clement, MD  Resting Radionuclide: Technetium 33m Tetrofosmin  Resting Radionuclide Dose: 11.0 mCi   Stress Radionuclide:  Technetium 82m Tetrofosmin  Stress Radionuclide Dose: 33.0 mCi           Stress Protocol Rest HR: 57 Stress HR: 126  Rest BP: 94/76 Stress BP: 119/75  Exercise Time (min): 9:50 METS: 9.4   Predicted Max HR: 156 bpm % Max HR: 80.77 bpm Rate Pressure Product: 21308   Dose of Adenosine (mg):  n/a Dose of Lexiscan: 0.4 mg  Dose of Atropine (mg): n/a Dose of Dobutamine: n/a mcg/kg/min (at max HR)  Stress Test Technologist: Irean Hong, RN  Nuclear Technologist:  Doyne Keel, CNMT     Rest Procedure:  Myocardial perfusion imaging was performed at rest 45 minutes following the intravenous administration of Technetium 87m Tetrofosmin. Rest ECG:  SB  Stress Procedure: The patient attempted to walk the treadmill utilizing the Bruce Protocol for 8 minutes, but was unable to reach the target heart rate due to marked fatigue, RPE=15. The patient received IV Lexiscan 0.4 mg over 15-seconds with concurrent low level exercise.  Technetium 72m Tetrofosmin was injected at 30-seconds while the patient continued walking. There were nonspecific changes with Lexiscan. The patient had a hypotensive response after stopping the treadmill complaining of dizziness.The symptoms subsided quickly after trendelenburg position, but the BP remained low. 0.9% NACL IV fluids given with BP returning  to baseline after 1000 cc. Quantitative spect images were obtained after a 45-minute delay. Stress ECG: No significant change from baseline ECG  QPS Raw Data Images:  There is interference from nuclear activity from structures below the diaphragm. This does not affect the ability to read the study. Stress Images:  There is decreased uptake in the inferior wall. Rest Images:  There is decreased uptake in the inferior wall. Subtraction (SDS):  There is a fixed inferior defect that is most consistent with diaphragmatic attenuation. Transient Ischemic Dilatation (Normal <1.22):  1.06 Lung/Heart Ratio (Normal <0.45):  0.30  Quantitative Gated Spect Images QGS EDV:  94 ml QGS ESV:  36 ml QGS cine images:  NL LV Function; NL Wall Motion QGS EF: 61%  Impression Exercise Capacity:  Lexiscan with low level exercise. BP Response:  Hypotensive blood pressure response. Clinical Symptoms:  No chest pain. ECG  Impression:  No significant ST segment change suggestive of ischemia. Comparison with Prior Nuclear Study: No significant change from previous study  Overall Impression:  Low risk stress nuclear study.  Old inferolateral scar.  No ischemia.  Hypotensive response to Lexiscan. Since last cardiolite study of 01/20/08, EF has increased from 58% to 61%.   Cassell Clement

## 2010-11-18 ENCOUNTER — Telehealth: Payer: Self-pay | Admitting: *Deleted

## 2010-11-18 NOTE — Telephone Encounter (Signed)
Advised patient of stress test results

## 2010-11-18 NOTE — Telephone Encounter (Signed)
Message copied by Eugenia Pancoast on Fri Nov 18, 2010  1:55 PM ------      Message from: Rosalio Macadamia      Created: Fri Nov 18, 2010 11:38 AM       Ok to report. Stress test is satisfactory. Call for any recurrent problems. Keep next appointment with Dr. Patty Sermons

## 2010-12-12 ENCOUNTER — Other Ambulatory Visit: Payer: Self-pay | Admitting: Cardiology

## 2010-12-12 DIAGNOSIS — I119 Hypertensive heart disease without heart failure: Secondary | ICD-10-CM

## 2010-12-12 DIAGNOSIS — E785 Hyperlipidemia, unspecified: Secondary | ICD-10-CM

## 2010-12-12 MED ORDER — ATORVASTATIN CALCIUM 20 MG PO TABS: 20.0000 mg | ORAL_TABLET | Freq: Every day | ORAL | Status: DC

## 2010-12-12 MED ORDER — METOPROLOL TARTRATE 25 MG PO TABS: 25.0000 mg | ORAL_TABLET | Freq: Two times a day (BID) | ORAL | Status: DC

## 2010-12-12 MED ORDER — LOSARTAN POTASSIUM 100 MG PO TABS: 100.0000 mg | ORAL_TABLET | Freq: Every day | ORAL | Status: DC

## 2010-12-12 NOTE — Telephone Encounter (Signed)
Pt called and has changed pharmacy:  Please fax new pres into [facility]:  Fax number [phone].  Please fax for Metoprolol tratrate 25mg , Losartan 100mg , and Atorvastin 20mg .  90 day supply on each one.  Please call patient when this is done.

## 2010-12-13 ENCOUNTER — Other Ambulatory Visit: Payer: Self-pay | Admitting: *Deleted

## 2010-12-13 DIAGNOSIS — E785 Hyperlipidemia, unspecified: Secondary | ICD-10-CM

## 2010-12-13 MED ORDER — ATORVASTATIN CALCIUM 20 MG PO TABS: 20.0000 mg | ORAL_TABLET | Freq: Every day | ORAL | Status: DC

## 2010-12-13 NOTE — Telephone Encounter (Signed)
Printed instead of going emr yesterday

## 2010-12-20 LAB — COMPREHENSIVE METABOLIC PANEL
ALT: 25
AST: 27
Albumin: 4.5
Alkaline Phosphatase: 66
BUN: 11
CO2: 26
Calcium: 9.4
Chloride: 103
Creatinine, Ser: 1.36
GFR calc Af Amer: >60
GFR calc non Af Amer: 53 — ABNORMAL LOW
Glucose, Bld: 123 — ABNORMAL HIGH
Potassium: 4.4
Sodium: 136
Total Bilirubin: 1
Total Protein: 7.3

## 2010-12-20 LAB — DIFFERENTIAL
Basophils Absolute: 0.1
Basophils Relative: 1
Eosinophils Absolute: 0
Eosinophils Relative: 1
Lymphocytes Relative: 20
Lymphs Abs: 1.6
Monocytes Absolute: 0.5
Monocytes Relative: 7
Neutro Abs: 5.7
Neutrophils Relative %: 72

## 2010-12-20 LAB — CBC
HCT: 41.5
Hemoglobin: 14.5
MCHC: 34.8
MCV: 92.7
Platelets: 160
RBC: 4.48
RDW: 13.2
WBC: 7.9

## 2011-03-14 ENCOUNTER — Encounter: Payer: Self-pay | Admitting: Cardiology

## 2011-03-14 ENCOUNTER — Ambulatory Visit (INDEPENDENT_AMBULATORY_CARE_PROVIDER_SITE_OTHER): Payer: Medicare Other | Admitting: Cardiology

## 2011-03-14 VITALS — BP 118/76 | HR 80 | Resp 18 | Ht 72.0 in | Wt 185.0 lb

## 2011-03-14 DIAGNOSIS — E7800 Pure hypercholesterolemia, unspecified: Secondary | ICD-10-CM

## 2011-03-14 DIAGNOSIS — I259 Chronic ischemic heart disease, unspecified: Secondary | ICD-10-CM

## 2011-03-14 DIAGNOSIS — I2510 Atherosclerotic heart disease of native coronary artery without angina pectoris: Secondary | ICD-10-CM

## 2011-03-14 LAB — BASIC METABOLIC PANEL
BUN: 12 mg/dL (ref 6–23)
CO2: 28 mEq/L (ref 19–32)
Calcium: 9.4 mg/dL (ref 8.4–10.5)
Chloride: 106 mEq/L (ref 96–112)
Creatinine, Ser: 1.4 mg/dL (ref 0.4–1.5)
GFR: 64.83 mL/min (ref >60.00)
Glucose, Bld: 92 mg/dL (ref 70–99)
Potassium: 4.1 mEq/L (ref 3.5–5.1)
Sodium: 141 mEq/L (ref 135–145)

## 2011-03-14 LAB — HEPATIC FUNCTION PANEL
ALT: 32 U/L (ref 0–53)
AST: 27 U/L (ref 0–37)
Albumin: 4.1 g/dL (ref 3.5–5.2)
Alkaline Phosphatase: 63 U/L (ref 39–117)
Bilirubin, Direct: 0.1 mg/dL (ref 0.0–0.3)
Total Bilirubin: 0.8 mg/dL (ref 0.3–1.2)
Total Protein: 6.8 g/dL (ref 6.0–8.3)

## 2011-03-14 LAB — LIPID PANEL
Cholesterol: 121 mg/dL (ref 0–200)
HDL: 51.9 mg/dL (ref >39.00)
LDL Cholesterol: 54 mg/dL (ref 0–99)
Total CHOL/HDL Ratio: 2
Triglycerides: 77 mg/dL (ref 0.0–149.0)
VLDL: 15.4 mg/dL (ref 0.0–40.0)

## 2011-03-14 NOTE — Progress Notes (Signed)
Gilbert Harris Date of Birth:  Oct 20, 1945 [facility] Card[facility] / [facility] [address]   [address] [place], [place]  [address] [phone]           Fax   [phone]  History of Present Illness: This pleasant 65 year old gentleman is seen for a scheduled followup office visit.  He has a history of a remote inferior wall myocardial infarction.  He had coronary artery bypass graft surgery in 2007.  His last stress test was in 2009.  Current Outpatient Prescriptions  Medication Sig Dispense Refill  . aspirin 325 MG tablet Take 81 mg by mouth daily.       Marland Kitchen atorvastatin (LIPITOR) 20 MG tablet Take 1 tablet (20 mg total) by mouth daily.  90 tablet  3  . LORazepam (ATIVAN) 1 MG tablet Take 1 mg by mouth daily.        Marland Kitchen losartan (COZAAR) 100 MG tablet Take 1 tablet (100 mg total) by mouth daily.  90 tablet  3  . metoprolol tartrate (LOPRESSOR) 25 MG tablet Take 1 tablet (25 mg total) by mouth 2 (two) times daily.  180 tablet  3  . Multiple Vitamin (MULTIVITAMIN) tablet Take 1 tablet by mouth daily.        . nitroGLYCERIN (NITROSTAT) 0.4 MG SL tablet Place 1 tablet (0.4 mg total) under the tongue every 5 (five) minutes as needed.  25 tablet  6  . thioridazine (MELLARIL) 25 MG tablet Take 25 mg by mouth 2 (two) times daily.          Allergies  Allergen Reactions  . Haloperidol Decanoate     Patient Active Problem List  Diagnoses  . HYPERTHYROIDISM  . HYPERCHOLESTEROLEMIA  . UNSPECIFIED ESSENTIAL HYPERTENSION  . CAD    History  Smoking status  . Former Smoker  Smokeless tobacco  . Not on file    History  Alcohol Use No    Family History  Problem Relation Age of Onset  . Heart disease Mother   . Heart disease Father   . Heart failure Maternal Aunt     Review of Systems: Constitutional: no fever chills diaphoresis or fatigue or change in weight.  Head and neck: no hearing loss, no epistaxis, no photophobia or visual disturbance. Respiratory: No cough, shortness  of breath or wheezing. Cardiovascular: No chest pain peripheral edema, palpitations. Gastrointestinal: No abdominal distention, no abdominal pain, no change in bowel habits hematochezia or melena. Genitourinary: No dysuria, no frequency, no urgency, no nocturia. Musculoskeletal:No arthralgias, no back pain, no gait disturbance or myalgias. Neurological: No dizziness, no headaches, no numbness, no seizures, no syncope, no weakness, no tremors. Hematologic: No lymphadenopathy, no easy bruising. Psychiatric: No confusion, no hallucinations, no sleep disturbance.    Physical Exam: Filed Vitals:   03/14/11 0857  BP: 118/76  Pulse: 80  Resp: 18   the general appearance reveals a well-developed well-nourished gentleman in no distress.The head and neck exam reveals pupils equal and reactive.  Extraocular movements are full.  There is no scleral icterus.  The mouth and pharynx are normal.  The neck is supple.  The carotids reveal no bruits.  The jugular venous pressure is normal.  The  thyroid is not enlarged.  There is no lymphadenopathy.  The chest is clear to percussion and auscultation.  There are no rales or rhonchi.  Expansion of the chest is symmetrical.  The precordium is quiet.  The first heart sound is normal.  The second heart sound is  physiologically split.  There is no murmur gallop rub or click.  There is no abnormal lift or heave.  The abdomen is soft and nontender.  The bowel sounds are normal.  The liver and spleen are not enlarged.  There are no abdominal masses.  There are no abdominal bruits.  Extremities reveal good pedal pulses.  There is no phlebitis or edema.  There is no cyanosis or clubbing.  Strength is normal and symmetrical in all extremities.  There is no lateralizing weakness.  There are no sensory deficits.  The skin is warm and dry.  There is no rash.     Assessment / Plan: Today we are getting a fasting lab work.  We'll plan to recheck him in 4 months for followup  office visit and EKG.  Continue present medication and continue regular exercise and careful diet

## 2011-03-14 NOTE — Patient Instructions (Addendum)
Will obtain labs today and call you with the results  Your physician recommends that you continue on your current medications as directed. Please refer to the Current Medication list given to you today.  Your physician wants you to follow-up in: 4 month You will receive a reminder letter in the mail two months in advance. If you don't receive a letter, please call our office to schedule the follow-up appointment.

## 2011-03-14 NOTE — Assessment & Plan Note (Signed)
The patient has a history of high cholesterol and is on atorvastatin 20 mg daily with good results.

## 2011-03-14 NOTE — Assessment & Plan Note (Signed)
The patient denies any exertional chest pain.  He's not having any symptoms of congestive heart failure.

## 2011-03-15 ENCOUNTER — Telehealth: Payer: Self-pay | Admitting: *Deleted

## 2011-03-15 NOTE — Telephone Encounter (Signed)
Message copied by Eugenia Pancoast on Wed Mar 15, 2011  4:22 PM ------      Message from: Cassell Clement      Created: Tue Mar 14, 2011  7:02 PM       Please report.  The labs are stable.  Continue same meds.  Continue careful diet.

## 2011-03-15 NOTE — Telephone Encounter (Signed)
Advised patient of labs 

## 2011-04-07 ENCOUNTER — Encounter: Payer: Self-pay | Admitting: Cardiology

## 2011-04-10 ENCOUNTER — Encounter: Payer: Self-pay | Admitting: Cardiology

## 2011-07-12 ENCOUNTER — Ambulatory Visit: Payer: Medicare Other | Admitting: Cardiology

## 2011-07-14 ENCOUNTER — Encounter: Payer: Self-pay | Admitting: Cardiology

## 2011-07-14 ENCOUNTER — Other Ambulatory Visit (INDEPENDENT_AMBULATORY_CARE_PROVIDER_SITE_OTHER): Payer: Medicare Other

## 2011-07-14 ENCOUNTER — Ambulatory Visit (INDEPENDENT_AMBULATORY_CARE_PROVIDER_SITE_OTHER): Payer: Medicare Other | Admitting: Cardiology

## 2011-07-14 VITALS — BP 118/88 | HR 59 | Ht 72.0 in | Wt 186.0 lb

## 2011-07-14 DIAGNOSIS — E7800 Pure hypercholesterolemia, unspecified: Secondary | ICD-10-CM

## 2011-07-14 DIAGNOSIS — I119 Hypertensive heart disease without heart failure: Secondary | ICD-10-CM | POA: Insufficient documentation

## 2011-07-14 DIAGNOSIS — I2510 Atherosclerotic heart disease of native coronary artery without angina pectoris: Secondary | ICD-10-CM

## 2011-07-14 LAB — HEPATIC FUNCTION PANEL
ALT: 31 U/L (ref 0–53)
AST: 30 U/L (ref 0–37)
Albumin: 4.1 g/dL (ref 3.5–5.2)
Alkaline Phosphatase: 52 U/L (ref 39–117)
Bilirubin, Direct: 0.1 mg/dL (ref 0.0–0.3)
Total Bilirubin: 0.7 mg/dL (ref 0.3–1.2)
Total Protein: 6.8 g/dL (ref 6.0–8.3)

## 2011-07-14 LAB — LIPID PANEL
Cholesterol: 121 mg/dL (ref 0–200)
HDL: 49.8 mg/dL (ref >39.00)
LDL Cholesterol: 59 mg/dL (ref 0–99)
Total CHOL/HDL Ratio: 2
Triglycerides: 62 mg/dL (ref 0.0–149.0)
VLDL: 12.4 mg/dL (ref 0.0–40.0)

## 2011-07-14 LAB — BASIC METABOLIC PANEL
BUN: 15 mg/dL (ref 6–23)
CO2: 29 mEq/L (ref 19–32)
Calcium: 9.2 mg/dL (ref 8.4–10.5)
Chloride: 104 mEq/L (ref 96–112)
Creatinine, Ser: 1.2 mg/dL (ref 0.4–1.5)
GFR: 77.26 mL/min (ref >60.00)
Glucose, Bld: 90 mg/dL (ref 70–99)
Potassium: 4.2 mEq/L (ref 3.5–5.1)
Sodium: 139 mEq/L (ref 135–145)

## 2011-07-14 NOTE — Patient Instructions (Addendum)
Will obtain labs today and call you with the results (lp/bmet/hfp)  Decrease your salt intake  Your physician recommends that you continue on your current medications as directed. Please refer to the Current Medication list given to you today.  Your physician recommends that you schedule a follow-up appointment in: 4 months with fasting labs (lp/bmet/hfp)

## 2011-07-14 NOTE — Assessment & Plan Note (Signed)
The patient has had no recurrent chest pain or angina.  He gets occasional exercise using a home treadmill.  His electrocardiogram today shows no ischemic change

## 2011-07-14 NOTE — Assessment & Plan Note (Signed)
His diastolic blood pressure is slightly elevated today.  I rechecked his blood pressure and got 116/88.  He does admit to having a lot of salt in his food yesterday including some canned soup.  He will decrease his dietary salt and we will not increase his medications today

## 2011-07-14 NOTE — Assessment & Plan Note (Signed)
The patient has a history of hypercholesterolemia.  He is on Lipitor 20 mg daily.  We are checking lab work today

## 2011-07-14 NOTE — Progress Notes (Signed)
Gilbert Harris Date of Birth:  [birth year] [facility] [address] [place], [place]  [address] [phone]  Fax   [phone]  HPI: This pleasant 66 year old gentleman is seen for a scheduled four-month followup office visit.  He has a past history of ischemic heart disease.  He underwent coronary artery bypass graft surgery in 2007.  He had a previous remote inferior wall myocardial infarction.  His last stress test was in 2009 and was negative.  The patient has a history of high blood pressure and hypercholesterolemia.  He also has psychiatric problems and is followed by Dr. Ladona Ridgel his psychiatrist about every 4 months.  He is on Mellaril for his psychiatric illness  Current Outpatient Prescriptions  Medication Sig Dispense Refill  . aspirin 325 MG tablet Take 81 mg by mouth daily.       Marland Kitchen atorvastatin (LIPITOR) 20 MG tablet Take 1 tablet (20 mg total) by mouth daily.  90 tablet  3  . LORazepam (ATIVAN) 1 MG tablet Take 1 mg by mouth daily.        Marland Kitchen losartan (COZAAR) 100 MG tablet Take 1 tablet (100 mg total) by mouth daily.  90 tablet  3  . metoprolol tartrate (LOPRESSOR) 25 MG tablet Take 1 tablet (25 mg total) by mouth 2 (two) times daily.  180 tablet  3  . Multiple Vitamin (MULTIVITAMIN) tablet Take 1 tablet by mouth daily.        . nitroGLYCERIN (NITROSTAT) 0.4 MG SL tablet Place 1 tablet (0.4 mg total) under the tongue every 5 (five) minutes as needed.  25 tablet  6  . thioridazine (MELLARIL) 25 MG tablet Take 25 mg by mouth 2 (two) times daily.          Allergies  Allergen Reactions  . Haloperidol Decanoate     Patient Active Problem List  Diagnoses  . HYPERTHYROIDISM  . HYPERCHOLESTEROLEMIA  . UNSPECIFIED ESSENTIAL HYPERTENSION  . CAD  . Benign hypertensive heart disease without heart failure    History  Smoking status  . Former Smoker  Smokeless tobacco  . Not on file    History  Alcohol Use No    Family History  Problem Relation Age  of Onset  . Heart disease Mother   . Heart disease Father   . Heart failure Maternal Aunt     Review of Systems: The patient denies any heat or cold intolerance.  No weight gain or weight loss.  The patient denies headaches or blurry vision.  There is no cough or sputum production.  The patient denies dizziness.  There is no hematuria or hematochezia.  The patient denies any muscle aches or arthritis.  The patient denies any rash.  The patient denies frequent falling or instability.  There is no history of depression or anxiety.  All other systems were reviewed and are negative.   Physical Exam: Filed Vitals:   07/14/11 0854  BP: 118/88  Pulse: 59   the general appearance reveals a well-developed well-nourished gentleman in no distress.The head and neck exam reveals pupils equal and reactive.  Extraocular movements are full.  There is no scleral icterus.  The mouth and pharynx are normal.  The neck is supple.  The carotids reveal no bruits.  The jugular venous pressure is normal.  The  thyroid is not enlarged.  There is no lymphadenopathy.  The chest is clear to percussion and auscultation.  There are no rales or rhonchi.  Expansion of the chest  is symmetrical.  The precordium is quiet.  The first heart sound is normal.  The second heart sound is physiologically split.  There is no murmur gallop rub or click.  There is no abnormal lift or heave.  The abdomen is soft and nontender.  The bowel sounds are normal.  The liver and spleen are not enlarged.  There are no abdominal masses.  There are no abdominal bruits.  Extremities reveal good pedal pulses.  There is no phlebitis or edema.  There is no cyanosis or clubbing.  Strength is normal and symmetrical in all extremities.  There is no lateralizing weakness.  There are no sensory deficits.  The skin is warm and dry.  There is no rash.  EKG shows normal sinus rhythm possible left atrial enlargement and no ischemic changes    Assessment /  Plan: Continue same medication.  Blood work today pending.  Recheck 4 months for office visit and lab work

## 2011-07-14 NOTE — Progress Notes (Signed)
Quick Note:  Please report to patient. The recent labs are stable. Continue same medication and careful diet. ______ 

## 2011-07-17 ENCOUNTER — Telehealth: Payer: Self-pay | Admitting: *Deleted

## 2011-07-17 NOTE — Telephone Encounter (Signed)
Message copied by Burnell Blanks on Mon Jul 17, 2011  2:46 PM ------      Message from: Cassell Clement      Created: Fri Jul 14, 2011  4:14 PM       Please report to patient.  The recent labs are stable. Continue same medication and careful diet.

## 2011-07-17 NOTE — Telephone Encounter (Signed)
Advised patient

## 2011-09-25 ENCOUNTER — Encounter (HOSPITAL_COMMUNITY): Payer: Self-pay

## 2011-09-25 ENCOUNTER — Emergency Department (HOSPITAL_COMMUNITY)
Admission: EM | Admit: 2011-09-25 | Discharge: 2011-09-25 | Disposition: A | Discharge status: Home / Self Care | Payer: Medicare Other | Source: Home / Self Care | Attending: Emergency Medicine | Admitting: Emergency Medicine

## 2011-09-25 DIAGNOSIS — J392 Other diseases of pharynx: Secondary | ICD-10-CM

## 2011-09-25 DIAGNOSIS — R05 Cough: Secondary | ICD-10-CM

## 2011-09-25 DIAGNOSIS — R059 Cough, unspecified: Secondary | ICD-10-CM

## 2011-09-25 HISTORY — DX: Acute myocardial infarction, unspecified: I21.9

## 2011-09-25 MED ORDER — ACETAMINOPHEN-CODEINE #3 300-30 MG PO TABS: 1.0000 | ORAL_TABLET | Freq: Four times a day (QID) | ORAL | Status: AC | PRN

## 2011-09-25 NOTE — ED Notes (Signed)
C/o sore throat for 1 week.  States he also has a cough that is worse at night and when he is in closed spaces like a vehicle. Denies fever.

## 2011-09-25 NOTE — ED Provider Notes (Signed)
History     CSN: [id number]  Arrival date & time 09/25/11  1354   First MD Initiated Contact with Patient 09/25/11 1508      Chief Complaint  Patient presents with  . Sore Throat  . Cough    (Consider location/radiation/quality/duration/timing/severity/associated sxs/prior treatment) HPI Comments: Patient presents urgent care complaining of an ongoing sore throat for about a week. He is also that has a dry cough mainly at night when he is in close a [place] such as when in his car. Denies any fevers, shortness of breath or upper congestion. Patient have not tried anything over-the-counter so far. Denies any discomfort at this point. Minimal discomfort when he swallows.  Patient is a 66 y.o. male presenting with pharyngitis and cough. The history is provided by the patient.  Sore Throat This is a new problem. The current episode started more than 1 week ago. The problem occurs constantly. The problem has not changed since onset.Pertinent negatives include no chest pain, no abdominal pain, no headaches and no shortness of breath. Nothing relieves the symptoms. He has tried nothing for the symptoms.  Cough Associated symptoms include sore throat. Pertinent negatives include no chest pain, no chills, no ear pain, no headaches, no rhinorrhea and no shortness of breath.    Past Medical History  Diagnosis Date  . Coronary artery disease     CABG x4 LIMA to LAD, SV graft to RCA, sequential saphenous vein graft to diagnal and OM1  . HTN (hypertension)   . Hyperlipidemia   . Schizophrenia   . MI (myocardial infarction)     Past Surgical History  Procedure Date  . Cardiac catheterization     EF 50%--severe two vessel obstructive atherosclerotic coronary artery disease--Low normal left ventricle funcrtion  . Coronary artery bypass graft 2007    Family History  Problem Relation Age of Onset  . Heart disease Mother   . Heart disease Father   . Heart failure Maternal Aunt     History    Substance Use Topics  . Smoking status: Former Games developer  . Smokeless tobacco: Not on file  . Alcohol Use: No      Review of Systems  Constitutional: Negative for fever, chills, activity change, appetite change and fatigue.  HENT: Positive for sore throat. Negative for ear pain, congestion, facial swelling, rhinorrhea, sneezing, drooling, mouth sores, trouble swallowing, neck pain, dental problem, voice change, postnasal drip, sinus pressure and ear discharge.   Respiratory: Positive for cough. Negative for apnea, chest tightness and shortness of breath.   Cardiovascular: Negative for chest pain.  Gastrointestinal: Negative for abdominal pain.  Skin: Negative for rash.  Neurological: Negative for dizziness and headaches.    Allergies  Haloperidol decanoate  Home Medications   Current Outpatient Rx  Name Route Sig Dispense Refill  . ASPIRIN 325 MG PO TABS Oral Take 81 mg by mouth daily.     . ATORVASTATIN CALCIUM 20 MG PO TABS Oral Take 1 tablet (20 mg total) by mouth daily. 90 tablet 3  . LORAZEPAM 1 MG PO TABS Oral Take 1 mg by mouth daily.      Marland Kitchen LOSARTAN POTASSIUM 100 MG PO TABS Oral Take 1 tablet (100 mg total) by mouth daily. 90 tablet 3  . METOPROLOL TARTRATE 25 MG PO TABS Oral Take 1 tablet (25 mg total) by mouth 2 (two) times daily. 180 tablet 3  . ONE-DAILY MULTI VITAMINS PO TABS Oral Take 1 tablet by mouth daily.      Marland Kitchen  NITROGLYCERIN 0.4 MG SL SUBL Sublingual Place 1 tablet (0.4 mg total) under the tongue every 5 (five) minutes as needed. 25 tablet 6  . THIORIDAZINE HCL 25 MG PO TABS Oral Take 25 mg by mouth 2 (two) times daily.      . ACETAMINOPHEN-CODEINE #3 300-30 MG PO TABS Oral Take 1-2 tablets by mouth every 6 (six) hours as needed for pain. 15 tablet 0    BP 137/81  Pulse 66  Temp 98.8 F (37.1 C) (Oral)  Resp 20  SpO2 95%  Physical Exam  Nursing note and vitals reviewed. Constitutional: Vital signs are normal. He appears well-developed and well-nourished.   Non-toxic appearance. He does not have a sickly appearance. He does not appear ill. No distress.  HENT:  Head: Normocephalic.  Right Ear: Tympanic membrane and external ear normal.  Left Ear: Tympanic membrane normal.  Mouth/Throat: Uvula is midline and mucous membranes are normal. Posterior oropharyngeal erythema present. No oropharyngeal exudate, posterior oropharyngeal edema or tonsillar abscesses.  Eyes: Conjunctivae are normal. Right eye exhibits no discharge. Left eye exhibits no discharge.  Neck: Neck supple. No JVD present.  Cardiovascular: Normal rate.   Pulmonary/Chest: Effort normal and breath sounds normal. No respiratory distress. He has no decreased breath sounds. He has no rhonchi.  Lymphadenopathy:    He has no cervical adenopathy.  Neurological: He is alert.  Skin: No rash noted. No erythema.    ED Course  Procedures (including critical care time)  Labs Reviewed - No data to display No results found.   1. Cough   2. Pharyngeal irritation       MDM  Pharyngitis, with or reactive cough. Patient was prescribed Tylenol #3 encouraged to return if no improvement her cough persists beyond 2 weeks. Normal respiratory exam is normal pharyngeal exam.        Jimmie Molly, MD 09/25/11 1700

## 2011-10-02 ENCOUNTER — Other Ambulatory Visit: Payer: Self-pay | Admitting: Cardiology

## 2011-10-02 DIAGNOSIS — E785 Hyperlipidemia, unspecified: Secondary | ICD-10-CM

## 2011-10-02 DIAGNOSIS — I119 Hypertensive heart disease without heart failure: Secondary | ICD-10-CM

## 2011-10-02 MED ORDER — ATORVASTATIN CALCIUM 20 MG PO TABS: 20.0000 mg | ORAL_TABLET | Freq: Every day | ORAL | Status: DC

## 2011-10-02 MED ORDER — METOPROLOL TARTRATE 25 MG PO TABS: 25.0000 mg | ORAL_TABLET | Freq: Two times a day (BID) | ORAL | Status: DC

## 2011-10-02 MED ORDER — LOSARTAN POTASSIUM 100 MG PO TABS: 100.0000 mg | ORAL_TABLET | Freq: Every day | ORAL | Status: DC

## 2011-11-14 ENCOUNTER — Encounter: Payer: Self-pay | Admitting: Cardiology

## 2011-11-14 ENCOUNTER — Ambulatory Visit (INDEPENDENT_AMBULATORY_CARE_PROVIDER_SITE_OTHER): Payer: Medicare Other | Admitting: Cardiology

## 2011-11-14 ENCOUNTER — Other Ambulatory Visit (INDEPENDENT_AMBULATORY_CARE_PROVIDER_SITE_OTHER): Payer: Medicare Other

## 2011-11-14 VITALS — BP 120/80 | HR 68 | Resp 18 | Ht 72.0 in | Wt 185.0 lb

## 2011-11-14 DIAGNOSIS — I119 Hypertensive heart disease without heart failure: Secondary | ICD-10-CM

## 2011-11-14 DIAGNOSIS — R0989 Other specified symptoms and signs involving the circulatory and respiratory systems: Secondary | ICD-10-CM

## 2011-11-14 DIAGNOSIS — I2510 Atherosclerotic heart disease of native coronary artery without angina pectoris: Secondary | ICD-10-CM

## 2011-11-14 DIAGNOSIS — E7800 Pure hypercholesterolemia, unspecified: Secondary | ICD-10-CM

## 2011-11-14 LAB — HEPATIC FUNCTION PANEL
ALT: 36 U/L (ref 0–53)
AST: 30 U/L (ref 0–37)
Albumin: 4 g/dL (ref 3.5–5.2)
Alkaline Phosphatase: 58 U/L (ref 39–117)
Bilirubin, Direct: 0.2 mg/dL (ref 0.0–0.3)
Total Bilirubin: 0.9 mg/dL (ref 0.3–1.2)
Total Protein: 7 g/dL (ref 6.0–8.3)

## 2011-11-14 LAB — BASIC METABOLIC PANEL
BUN: 12 mg/dL (ref 6–23)
CO2: 29 mEq/L (ref 19–32)
Calcium: 9.3 mg/dL (ref 8.4–10.5)
Chloride: 106 mEq/L (ref 96–112)
Creatinine, Ser: 1.3 mg/dL (ref 0.4–1.5)
GFR: 68.61 mL/min (ref >60.00)
Glucose, Bld: 86 mg/dL (ref 70–99)
Potassium: 4.1 mEq/L (ref 3.5–5.1)
Sodium: 140 mEq/L (ref 135–145)

## 2011-11-14 LAB — LIPID PANEL
Cholesterol: 129 mg/dL (ref 0–200)
HDL: 64 mg/dL (ref >39.00)
LDL Cholesterol: 58 mg/dL (ref 0–99)
Total CHOL/HDL Ratio: 2
Triglycerides: 33 mg/dL (ref 0.0–149.0)
VLDL: 6.6 mg/dL (ref 0.0–40.0)

## 2011-11-14 NOTE — Progress Notes (Signed)
Quick Note:  Please report to patient. The recent labs are stable. Continue same medication and careful diet. ______ 

## 2011-11-14 NOTE — Patient Instructions (Addendum)
Your physician recommends that you continue on your current medications as directed. Please refer to the Current Medication list given to you today.  Your physician recommends that you schedule a follow-up appointment in: 4 months with fasting labs (lp/bmet/hfp)  

## 2011-11-14 NOTE — Assessment & Plan Note (Signed)
The patient has not been experiencing any chest pain or angina or shortness of breath.  No palpitations.  No dizziness or syncope.

## 2011-11-14 NOTE — Progress Notes (Signed)
Gilbert Harris Date of Birth:  January 06, 1946 [facility] [address] [place], [place]  [address] [phone]  Fax   [phone]  HPI: This pleasant 66 year old gentleman is seen for a scheduled four-month followup office visit. He has a past history of ischemic heart disease. He underwent coronary artery bypass graft surgery in 2007. He had a previous remote inferior wall myocardial infarction. His last stress test was in 2009 and was negative. The patient has a history of high blood pressure and hypercholesterolemia. He also has psychiatric problems and is followed by Dr. Ladona Ridgel his psychiatrist about every 4 months.  Since last visit the patient has been doing well.  He walks at least twice a week and his neighbor for exercise.  He lives in a group home called [facility] and he has lived there for a total of 20 years.  Psychiatric diagnosis is anxiety and depression according to the patient.   Current Outpatient Prescriptions  Medication Sig Dispense Refill  . aspirin 325 MG tablet Take 81 mg by mouth daily.       Marland Kitchen atorvastatin (LIPITOR) 20 MG tablet Take 1 tablet (20 mg total) by mouth daily.  90 tablet  3  . LORazepam (ATIVAN) 1 MG tablet Take 1 mg by mouth daily.        Marland Kitchen losartan (COZAAR) 100 MG tablet Take 1 tablet (100 mg total) by mouth daily.  90 tablet  3  . metoprolol tartrate (LOPRESSOR) 25 MG tablet Take 1 tablet (25 mg total) by mouth 2 (two) times daily.  180 tablet  3  . Multiple Vitamin (MULTIVITAMIN) tablet Take 1 tablet by mouth daily.        . nitroGLYCERIN (NITROSTAT) 0.4 MG SL tablet Place 1 tablet (0.4 mg total) under the tongue every 5 (five) minutes as needed.  25 tablet  6  . thioridazine (MELLARIL) 25 MG tablet Take 25 mg by mouth 2 (two) times daily.          Allergies  Allergen Reactions  . Haloperidol Decanoate     Patient Active Problem List  Diagnosis  . HYPERTHYROIDISM  . HYPERCHOLESTEROLEMIA  . UNSPECIFIED ESSENTIAL  HYPERTENSION  . CAD  . Benign hypertensive heart disease without heart failure    History  Smoking status  . Former Smoker  Smokeless tobacco  . Not on file    History  Alcohol Use No    Family History  Problem Relation Age of Onset  . Heart disease Mother   . Heart disease Father   . Heart failure Maternal Aunt     Review of Systems: The patient denies any heat or cold intolerance.  No weight gain or weight loss.  The patient denies headaches or blurry vision.  There is no cough or sputum production.  The patient denies dizziness.  There is no hematuria or hematochezia.  The patient denies any muscle aches or arthritis.  The patient denies any rash.  The patient denies frequent falling or instability.  There is no history of depression or anxiety.  All other systems were reviewed and are negative.   Physical Exam: Filed Vitals:   11/14/11 0953  BP: 120/80  Pulse: 68  Resp: 18   the general appearance reveals a well-developed well-nourished African American gentleman in no distress.  His mood is calm and he is not particularly anxious today.The head and neck exam reveals pupils equal and reactive.  Extraocular movements are full.  There is no scleral icterus.  The mouth and pharynx are normal.  The neck is supple.  The carotids reveal no bruits.  The jugular venous pressure is normal.  The  thyroid is not enlarged.  There is no lymphadenopathy.  The chest is clear to percussion and auscultation.  There are no rales or rhonchi.  Expansion of the chest is symmetrical.  The precordium is quiet.  The first heart sound is normal.  The second heart sound is physiologically split.  There is no murmur gallop rub or click.  There is no abnormal lift or heave.  The abdomen is soft and nontender.  The bowel sounds are normal.  The liver and spleen are not enlarged.  There are no abdominal masses.  There are no abdominal bruits.  Extremities reveal good pedal pulses.  There is no phlebitis or  edema.  There is no cyanosis or clubbing.  Strength is normal and symmetrical in all extremities.  There is no lateralizing weakness.  There are no sensory deficits.  The skin is warm and dry.  There is no rash.      Assessment / Plan: Continue on same medication.  Recheck in 4 months for office visit EKG and fasting lab work.

## 2011-11-14 NOTE — Assessment & Plan Note (Signed)
The patient is on Lipitor 20 mg daily.  Is not having any side effects from the statin

## 2011-11-14 NOTE — Assessment & Plan Note (Signed)
Blood pressure is stable on current therapy.  No dizziness or syncope 

## 2011-11-16 ENCOUNTER — Telehealth: Payer: Self-pay | Admitting: *Deleted

## 2011-11-16 NOTE — Telephone Encounter (Signed)
Advised patient of lab results  

## 2011-11-16 NOTE — Telephone Encounter (Signed)
Message copied by Burnell Blanks on Thu Nov 16, 2011  9:22 AM ------      Message from: Cassell Clement      Created: Tue Nov 14, 2011  9:09 PM       Please report to patient.  The recent labs are stable. Continue same medication and careful diet.

## 2012-03-12 ENCOUNTER — Ambulatory Visit (INDEPENDENT_AMBULATORY_CARE_PROVIDER_SITE_OTHER): Payer: Medicare Other | Admitting: Cardiology

## 2012-03-12 ENCOUNTER — Encounter: Payer: Self-pay | Admitting: Cardiology

## 2012-03-12 ENCOUNTER — Other Ambulatory Visit (INDEPENDENT_AMBULATORY_CARE_PROVIDER_SITE_OTHER): Payer: Medicare Other

## 2012-03-12 VITALS — BP 113/78 | HR 61 | Ht 72.0 in | Wt 183.0 lb

## 2012-03-12 DIAGNOSIS — I119 Hypertensive heart disease without heart failure: Secondary | ICD-10-CM

## 2012-03-12 DIAGNOSIS — I259 Chronic ischemic heart disease, unspecified: Secondary | ICD-10-CM

## 2012-03-12 DIAGNOSIS — E7800 Pure hypercholesterolemia, unspecified: Secondary | ICD-10-CM

## 2012-03-12 DIAGNOSIS — I2510 Atherosclerotic heart disease of native coronary artery without angina pectoris: Secondary | ICD-10-CM

## 2012-03-12 LAB — HEPATIC FUNCTION PANEL
ALT: 38 U/L (ref 0–53)
AST: 30 U/L (ref 0–37)
Albumin: 4.2 g/dL (ref 3.5–5.2)
Alkaline Phosphatase: 55 U/L (ref 39–117)
Bilirubin, Direct: 0.1 mg/dL (ref 0.0–0.3)
Total Bilirubin: 0.8 mg/dL (ref 0.3–1.2)
Total Protein: 7.2 g/dL (ref 6.0–8.3)

## 2012-03-12 LAB — BASIC METABOLIC PANEL
BUN: 11 mg/dL (ref 6–23)
CO2: 31 mEq/L (ref 19–32)
Calcium: 9.7 mg/dL (ref 8.4–10.5)
Chloride: 102 mEq/L (ref 96–112)
Creatinine, Ser: 1.3 mg/dL (ref 0.4–1.5)
GFR: 72.91 mL/min (ref >60.00)
Glucose, Bld: 99 mg/dL (ref 70–99)
Potassium: 4 mEq/L (ref 3.5–5.1)
Sodium: 137 mEq/L (ref 135–145)

## 2012-03-12 LAB — LIPID PANEL
Cholesterol: 122 mg/dL (ref 0–200)
HDL: 51.1 mg/dL (ref >39.00)
LDL Cholesterol: 61 mg/dL (ref 0–99)
Total CHOL/HDL Ratio: 2
Triglycerides: 50 mg/dL (ref 0.0–149.0)
VLDL: 10 mg/dL (ref 0.0–40.0)

## 2012-03-12 NOTE — Progress Notes (Signed)
Gilbert Harris Date of Birth:  12-Nov-1945 [facility] [address] [place], [place]  [address] [phone]  Fax   [phone]  HPI: This pleasant 66 year old gentleman is seen for a scheduled four-month followup office visit. He has a past history of ischemic heart disease. He underwent coronary artery bypass graft surgery in 2007. He had a previous remote inferior wall myocardial infarction. His last stress test was in 2009 and was negative. The patient has a history of high blood pressure and hypercholesterolemia. He also has psychiatric problems and is followed by Dr. Ladona Ridgel his psychiatrist about every 4 months.  Since last visit the patient has been doing well. He walks at least twice a week and his neighbor for exercise. He lives in a group home called [facility] and he has lived there for a total of 20 years. Psychiatric diagnosis is anxiety and depression according to the patient.   Current Outpatient Prescriptions  Medication Sig Dispense Refill  . aspirin 325 MG tablet Take 81 mg by mouth daily.       Marland Kitchen atorvastatin (LIPITOR) 20 MG tablet Take 1 tablet (20 mg total) by mouth daily.  90 tablet  3  . LORazepam (ATIVAN) 1 MG tablet Take 1 mg by mouth daily.        Marland Kitchen losartan (COZAAR) 100 MG tablet Take 1 tablet (100 mg total) by mouth daily.  90 tablet  3  . metoprolol tartrate (LOPRESSOR) 25 MG tablet Take 1 tablet (25 mg total) by mouth 2 (two) times daily.  180 tablet  3  . Multiple Vitamin (MULTIVITAMIN) tablet Take 1 tablet by mouth daily.        . nitroGLYCERIN (NITROSTAT) 0.4 MG SL tablet Place 1 tablet (0.4 mg total) under the tongue every 5 (five) minutes as needed.  25 tablet  6  . thioridazine (MELLARIL) 25 MG tablet Take 25 mg by mouth 2 (two) times daily.          Allergies  Allergen Reactions  . Haloperidol Decanoate     Patient Active Problem List  Diagnosis  . HYPERTHYROIDISM  . HYPERCHOLESTEROLEMIA  . UNSPECIFIED ESSENTIAL  HYPERTENSION  . CAD  . Benign hypertensive heart disease without heart failure    History  Smoking status  . Former Smoker  Smokeless tobacco  . Not on file    History  Alcohol Use No    Family History  Problem Relation Age of Onset  . Heart disease Mother   . Heart disease Father   . Heart failure Maternal Aunt     Review of Systems: The patient denies any heat or cold intolerance.  No weight gain or weight loss.  The patient denies headaches or blurry vision.  There is no cough or sputum production.  The patient denies dizziness.  There is no hematuria or hematochezia.  The patient denies any muscle aches or arthritis.  The patient denies any rash.  The patient denies frequent falling or instability.  There is no history of depression or anxiety.  All other systems were reviewed and are negative.   Physical Exam: Filed Vitals:   03/12/12 0920  BP: 113/78  Pulse: 61   And will appearance reveals a well-developed well-nourished gentleman in no distress.The head and neck exam reveals pupils equal and reactive.  Extraocular movements are full.  There is no scleral icterus.  The mouth and pharynx are normal.  The neck is supple.  The carotids reveal no bruits.  The  jugular venous pressure is normal.  The  thyroid is not enlarged.  There is no lymphadenopathy.  The chest is clear to percussion and auscultation.  There are no rales or rhonchi.  Expansion of the chest is symmetrical.  The precordium is quiet.  The first heart sound is normal.  The second heart sound is physiologically split.  There is no murmur gallop rub or click.  There is no abnormal lift or heave.  The abdomen is soft and nontender.  The bowel sounds are normal.  The liver and spleen are not enlarged.  There are no abdominal masses.  There are no abdominal bruits.  Extremities reveal good pedal pulses.  There is no phlebitis or edema.  There is no cyanosis or clubbing.  Strength is normal and symmetrical in all  extremities.  There is no lateralizing weakness.  There are no sensory deficits.  The skin is warm and dry.  There is no rash.     Assessment / Plan: Continue same medication.  Recheck in 4 months for followup office visit lipid panel hepatic function panel and basal metabolic panel

## 2012-03-12 NOTE — Assessment & Plan Note (Signed)
She is on atorvastatin 20 mg daily.  No myalgias or side effects.

## 2012-03-12 NOTE — Patient Instructions (Addendum)
Your physician recommends that you continue on your current medications as directed. Please refer to the Current Medication list given to you today.  Your physician wants you to follow-up in: 4 months with fasting labs (lp/bmet/hfp) You will receive a reminder letter in the mail two months in advance. If you don't receive a letter, please call our office to schedule the follow-up appointment.  

## 2012-03-12 NOTE — Progress Notes (Signed)
Quick Note:  Please report to patient. The recent labs are stable. Continue same medication and careful diet. ______ 

## 2012-03-12 NOTE — Assessment & Plan Note (Signed)
Patient has not had any recurrent chest pain or angina 

## 2012-03-13 ENCOUNTER — Telehealth: Payer: Self-pay | Admitting: *Deleted

## 2012-03-13 NOTE — Telephone Encounter (Signed)
Message copied by Burnell Blanks on Wed Mar 13, 2012  5:21 PM ------      Message from: Cassell Clement      Created: Tue Mar 12, 2012  9:37 PM       Please report to patient.  The recent labs are stable. Continue same medication and careful diet.

## 2012-05-07 ENCOUNTER — Other Ambulatory Visit: Payer: Self-pay | Admitting: Nurse Practitioner

## 2012-07-10 ENCOUNTER — Encounter: Payer: Self-pay | Admitting: Cardiology

## 2012-07-10 ENCOUNTER — Ambulatory Visit (INDEPENDENT_AMBULATORY_CARE_PROVIDER_SITE_OTHER): Payer: Medicare Other | Admitting: Cardiology

## 2012-07-10 ENCOUNTER — Other Ambulatory Visit (INDEPENDENT_AMBULATORY_CARE_PROVIDER_SITE_OTHER): Payer: Medicare Other

## 2012-07-10 VITALS — BP 116/72 | HR 58 | Ht 72.0 in | Wt 187.2 lb

## 2012-07-10 DIAGNOSIS — E7800 Pure hypercholesterolemia, unspecified: Secondary | ICD-10-CM

## 2012-07-10 DIAGNOSIS — I10 Essential (primary) hypertension: Secondary | ICD-10-CM

## 2012-07-10 DIAGNOSIS — I2510 Atherosclerotic heart disease of native coronary artery without angina pectoris: Secondary | ICD-10-CM

## 2012-07-10 DIAGNOSIS — I259 Chronic ischemic heart disease, unspecified: Secondary | ICD-10-CM

## 2012-07-10 LAB — HEPATIC FUNCTION PANEL
ALT: 44 U/L (ref 0–53)
AST: 37 U/L (ref 0–37)
Albumin: 4 g/dL (ref 3.5–5.2)
Alkaline Phosphatase: 57 U/L (ref 39–117)
Bilirubin, Direct: 0.2 mg/dL (ref 0.0–0.3)
Total Bilirubin: 0.7 mg/dL (ref 0.3–1.2)
Total Protein: 6.9 g/dL (ref 6.0–8.3)

## 2012-07-10 LAB — BASIC METABOLIC PANEL
BUN: 11 mg/dL (ref 6–23)
CO2: 28 mEq/L (ref 19–32)
Calcium: 9.3 mg/dL (ref 8.4–10.5)
Chloride: 103 mEq/L (ref 96–112)
Creatinine, Ser: 1.4 mg/dL (ref 0.4–1.5)
GFR: 67.88 mL/min (ref >60.00)
Glucose, Bld: 87 mg/dL (ref 70–99)
Potassium: 4.3 mEq/L (ref 3.5–5.1)
Sodium: 138 mEq/L (ref 135–145)

## 2012-07-10 LAB — LIPID PANEL
Cholesterol: 120 mg/dL (ref 0–200)
HDL: 45.9 mg/dL (ref >39.00)
LDL Cholesterol: 53 mg/dL (ref 0–99)
Total CHOL/HDL Ratio: 3
Triglycerides: 104 mg/dL (ref 0.0–149.0)
VLDL: 20.8 mg/dL (ref 0.0–40.0)

## 2012-07-10 NOTE — Progress Notes (Signed)
Quick Note:  Please report to patient. The recent labs are stable. Continue same medication and careful diet. ______ 

## 2012-07-10 NOTE — Assessment & Plan Note (Signed)
The patient has had no recurrent chest pain to suggest angina pectoris.

## 2012-07-10 NOTE — Assessment & Plan Note (Signed)
The patient is on Lipitor for his hypercholesterolemia.  He denies any myalgias.  Blood work is pending

## 2012-07-10 NOTE — Progress Notes (Signed)
Astrid Divine Date of Birth:  Apr 14, 1945 [facility] [address] [place], [place]  [address] [phone]  Fax   [phone]  HPI: This pleasant 67 year old gentleman is seen for a scheduled four-month followup office visit. He has a past history of ischemic heart disease. He underwent coronary artery bypass graft surgery in 2007. He had a previous remote inferior wall myocardial infarction. His last stress test was in 2009 and was negative. The patient has a history of high blood pressure and hypercholesterolemia. He also has psychiatric problems and is followed by Dr. Ladona Ridgel his psychiatrist about every 4 months.  Since last visit the patient has been doing well. He walks at least twice a week in his neighborhood for exercise. He lives in a group home called [facility] and he has lived there for a total of 20 years. Psychiatric diagnosis is anxiety and depression according to the patient.   Current Outpatient Prescriptions  Medication Sig Dispense Refill  . aspirin 325 MG tablet Take 81 mg by mouth daily.       Marland Kitchen atorvastatin (LIPITOR) 20 MG tablet Take 1 tablet (20 mg total) by mouth daily.  90 tablet  3  . LORazepam (ATIVAN) 1 MG tablet Take 1 mg by mouth daily.        Marland Kitchen losartan (COZAAR) 100 MG tablet Take 1 tablet (100 mg total) by mouth daily.  90 tablet  3  . metoprolol tartrate (LOPRESSOR) 25 MG tablet Take 1 tablet (25 mg total) by mouth 2 (two) times daily.  180 tablet  3  . Multiple Vitamin (MULTIVITAMIN) tablet Take 1 tablet by mouth daily.        Marland Kitchen NITROSTAT 0.4 MG SL tablet ONE TABLET UNDER TONGUE AS NEEDED FOR CHEST PAIN EVERY 5 MINUTES  25 tablet  2  . thioridazine (MELLARIL) 25 MG tablet Take 25 mg by mouth 2 (two) times daily.         No current facility-administered medications for this visit.    Allergies  Allergen Reactions  . Haloperidol Decanoate     Patient Active Problem List  Diagnosis  . HYPERTHYROIDISM  .  HYPERCHOLESTEROLEMIA  . UNSPECIFIED ESSENTIAL HYPERTENSION  . CAD  . Benign hypertensive heart disease without heart failure    History  Smoking status  . Former Smoker  Smokeless tobacco  . Not on file    History  Alcohol Use No    Family History  Problem Relation Age of Onset  . Heart disease Mother   . Heart disease Father   . Heart failure Maternal Aunt     Review of Systems: The patient denies any heat or cold intolerance.  No weight gain or weight loss.  The patient denies headaches or blurry vision.  There is no cough or sputum production.  The patient denies dizziness.  There is no hematuria or hematochezia.  The patient denies any muscle aches or arthritis.  The patient denies any rash.  The patient denies frequent falling or instability.  There is no history of depression or anxiety.  All other systems were reviewed and are negative.   Physical Exam: Filed Vitals:   07/10/12 0952  BP: 116/72  Pulse: 58   The general appearance reveals a well-developed well-nourished gentleman in no distress.The head and neck exam reveals pupils equal and reactive.  Extraocular movements are full.  There is no scleral icterus.  The mouth and pharynx are normal.  The neck is supple.  The carotids  reveal no bruits.  The jugular venous pressure is normal.  The  thyroid is not enlarged.  There is no lymphadenopathy.  The chest is clear to percussion and auscultation.  There are no rales or rhonchi.  Expansion of the chest is symmetrical.  The precordium is quiet.  The first heart sound is normal.  The second heart sound is physiologically split.  There is no murmur gallop rub or click.  There is no abnormal lift or heave.  The abdomen is soft and nontender.  The bowel sounds are normal.  The liver and spleen are not enlarged.  There are no abdominal masses.  There are no abdominal bruits.  Extremities reveal good pedal pulses.  There is no phlebitis or edema.  There is no cyanosis or clubbing.   Strength is normal and symmetrical in all extremities.  There is no lateralizing weakness.  There are no sensory deficits.  The skin is warm and dry.  There is no rash.     Assessment / Plan:  Patient is to continue same medication.  Lab work pending.  Recheck in 4 months for office visit EKG and fasting lab work

## 2012-07-10 NOTE — Assessment & Plan Note (Signed)
Blood pressures remaining stable on current therapy.  No palpitations dizziness or syncope or headache

## 2012-07-10 NOTE — Patient Instructions (Addendum)
Will obtain labs today and call you with the results   Your physician recommends that you schedule a follow-up appointment in: 4 months with fasting labs (lp/bmet/hfp) and ekg

## 2012-07-11 ENCOUNTER — Telehealth: Payer: Self-pay | Admitting: *Deleted

## 2012-07-11 NOTE — Telephone Encounter (Signed)
Message copied by Burnell Blanks on Thu Jul 11, 2012  8:49 AM ------      Message from: Cassell Clement      Created: Wed Jul 10, 2012  9:18 PM       Please report to patient.  The recent labs are stable. Continue same medication and careful diet. ------

## 2012-07-11 NOTE — Telephone Encounter (Signed)
Advised patient of lab results  

## 2012-11-08 ENCOUNTER — Other Ambulatory Visit: Payer: Self-pay | Admitting: *Deleted

## 2012-11-08 DIAGNOSIS — I119 Hypertensive heart disease without heart failure: Secondary | ICD-10-CM

## 2012-11-08 DIAGNOSIS — E785 Hyperlipidemia, unspecified: Secondary | ICD-10-CM

## 2012-11-08 MED ORDER — METOPROLOL TARTRATE 25 MG PO TABS: 25.0000 mg | ORAL_TABLET | Freq: Two times a day (BID) | ORAL | Status: DC

## 2012-11-08 MED ORDER — ATORVASTATIN CALCIUM 20 MG PO TABS: 20.0000 mg | ORAL_TABLET | Freq: Every day | ORAL | Status: DC

## 2012-11-08 MED ORDER — LOSARTAN POTASSIUM 100 MG PO TABS: 100.0000 mg | ORAL_TABLET | Freq: Every day | ORAL | Status: DC

## 2012-11-11 ENCOUNTER — Ambulatory Visit (INDEPENDENT_AMBULATORY_CARE_PROVIDER_SITE_OTHER): Payer: Medicare Other | Admitting: Cardiology

## 2012-11-11 ENCOUNTER — Encounter: Payer: Self-pay | Admitting: Cardiology

## 2012-11-11 ENCOUNTER — Other Ambulatory Visit: Payer: Medicare Other

## 2012-11-11 VITALS — BP 122/80 | HR 57 | Ht 72.0 in | Wt 189.4 lb

## 2012-11-11 DIAGNOSIS — I2510 Atherosclerotic heart disease of native coronary artery without angina pectoris: Secondary | ICD-10-CM

## 2012-11-11 DIAGNOSIS — E7800 Pure hypercholesterolemia, unspecified: Secondary | ICD-10-CM

## 2012-11-11 DIAGNOSIS — I119 Hypertensive heart disease without heart failure: Secondary | ICD-10-CM

## 2012-11-11 LAB — LIPID PANEL
Cholesterol: 125 mg/dL (ref 0–200)
HDL: 53.8 mg/dL (ref >39.00)
LDL Cholesterol: 55 mg/dL (ref 0–99)
Total CHOL/HDL Ratio: 2
Triglycerides: 83 mg/dL (ref 0.0–149.0)
VLDL: 16.6 mg/dL (ref 0.0–40.0)

## 2012-11-11 LAB — HEPATIC FUNCTION PANEL
ALT: 45 U/L (ref 0–53)
AST: 36 U/L (ref 0–37)
Albumin: 4.4 g/dL (ref 3.5–5.2)
Alkaline Phosphatase: 57 U/L (ref 39–117)
Bilirubin, Direct: 0.1 mg/dL (ref 0.0–0.3)
Total Bilirubin: 0.9 mg/dL (ref 0.3–1.2)
Total Protein: 7.6 g/dL (ref 6.0–8.3)

## 2012-11-11 LAB — BASIC METABOLIC PANEL
BUN: 9 mg/dL (ref 6–23)
CO2: 29 mEq/L (ref 19–32)
Calcium: 9.7 mg/dL (ref 8.4–10.5)
Chloride: 103 mEq/L (ref 96–112)
Creatinine, Ser: 1.4 mg/dL (ref 0.4–1.5)
GFR: 65.03 mL/min (ref >60.00)
Glucose, Bld: 98 mg/dL (ref 70–99)
Potassium: 4.8 mEq/L (ref 3.5–5.1)
Sodium: 138 mEq/L (ref 135–145)

## 2012-11-11 NOTE — Patient Instructions (Addendum)
Will obtain labs today and call you with the results (lp.bmet/hfp)  Your physician recommends that you continue on your current medications as directed. Please refer to the Current Medication list given to you today.  Your physician recommends that you schedule a follow-up appointment in: 4 months with fasting labs (lp/bmet/hfp/cbc)  

## 2012-11-11 NOTE — Progress Notes (Signed)
Astrid Divine Date of Birth:  04/21/1945 [facility] [address] [place], [place]  [address] [phone]  Fax   [phone]  HPI: This pleasant 67 year old gentleman is seen for a scheduled four-month followup office visit. He has a past history of ischemic heart disease. He underwent coronary artery bypass graft surgery in 2007. He had a previous remote inferior wall myocardial infarction. His last stress test was in 2009 and was negative. The patient has a history of high blood pressure and hypercholesterolemia. He also has psychiatric problems and is followed by Dr. Ladona Ridgel his psychiatrist about every 4 months.  Since last visit the patient has been doing well. He walks at least twice a week in his neighborhood for exercise. He lives in a group home called [facility] and he has lived there for a total of 20 years. Psychiatric diagnosis is anxiety and depression according to the patient.   Current Outpatient Prescriptions  Medication Sig Dispense Refill  . aspirin 325 MG tablet Take 81 mg by mouth daily.       Marland Kitchen atorvastatin (LIPITOR) 20 MG tablet Take 1 tablet (20 mg total) by mouth daily.  90 tablet  3  . LORazepam (ATIVAN) 1 MG tablet Take 1 mg by mouth daily.        Marland Kitchen losartan (COZAAR) 100 MG tablet Take 1 tablet (100 mg total) by mouth daily.  90 tablet  3  . metoprolol tartrate (LOPRESSOR) 25 MG tablet Take 1 tablet (25 mg total) by mouth 2 (two) times daily.  180 tablet  3  . Multiple Vitamin (MULTIVITAMIN) tablet Take 1 tablet by mouth daily.        Marland Kitchen NITROSTAT 0.4 MG SL tablet ONE TABLET UNDER TONGUE AS NEEDED FOR CHEST PAIN EVERY 5 MINUTES  25 tablet  2  . thioridazine (MELLARIL) 25 MG tablet Take 25 mg by mouth 2 (two) times daily.         No current facility-administered medications for this visit.    Allergies  Allergen Reactions  . Haloperidol Decanoate     Patient Active Problem List   Diagnosis Date Noted  . CAD 08/13/2008   Priority: High  . HYPERCHOLESTEROLEMIA 08/13/2008    Priority: Medium  . 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  . HYPERTHYROIDISM 08/13/2008  . UNSPECIFIED ESSENTIAL HYPERTENSION 08/13/2008    History  Smoking status  . Former Smoker  Smokeless tobacco  . Not on file    History  Alcohol Use No    Family History  Problem Relation Age of Onset  . Heart disease Mother   . Heart disease Father   . Heart failure Maternal Aunt     Review of Systems: The patient denies any heat or cold intolerance.  No weight gain or weight loss.  The patient denies headaches or blurry vision.  There is no cough or sputum production.  The patient denies dizziness.  There is no hematuria or hematochezia.  The patient denies any muscle aches or arthritis.  The patient denies any rash.  The patient denies frequent falling or instability.  There is no history of depression or anxiety.  All other systems were reviewed and are negative.   Physical Exam: Filed Vitals:   11/11/12 0926  BP: 122/80  Pulse: 57   The general appearance reveals a well-developed well-nourished gentleman in no distress.The head and neck exam reveals pupils equal and reactive.  Extraocular movements are full.  There is no scleral  icterus.  The mouth and pharynx are normal.  The neck is supple.  The carotids reveal no bruits.  The jugular venous pressure is normal.  The  thyroid is not enlarged.  There is no lymphadenopathy.  The chest is clear to percussion and auscultation.  There are no rales or rhonchi.  Expansion of the chest is symmetrical.  The precordium is quiet.  The first heart sound is normal.  The second heart sound is physiologically split.  There is no murmur gallop rub or click.  There is no abnormal lift or heave.  The abdomen is soft and nontender.  The bowel sounds are normal.  The liver and spleen are not enlarged.  There are no abdominal masses.  There are no abdominal bruits.   Extremities reveal good pedal pulses.  There is no phlebitis or edema.  There is no cyanosis or clubbing.  Strength is normal and symmetrical in all extremities.  There is no lateralizing weakness.  There are no sensory deficits.  The skin is warm and dry.  There is no rash.  EKG shows normal sinus rhythm and is within normal limits.  Assessment / Plan: Continue on same medication.  Recheck in 4 months for office visit CBC lipid panel hepatic function panel and basal metabolic

## 2012-11-11 NOTE — Progress Notes (Signed)
Quick Note:  Please report to patient. The recent labs are stable. Continue same medication and careful diet. ______ 

## 2012-11-11 NOTE — Assessment & Plan Note (Signed)
The patient exercises that this specimen are several times a week.  He is not experiencing any angina pectoris.

## 2012-11-11 NOTE — Assessment & Plan Note (Signed)
The patient has not been experiencing any side effects from his Lipitor.

## 2012-11-13 ENCOUNTER — Telehealth: Payer: Self-pay | Admitting: *Deleted

## 2012-11-13 NOTE — Telephone Encounter (Signed)
Message copied by Burnell Blanks on Wed Nov 13, 2012  6:33 PM ------      Message from: Cassell Clement      Created: Mon Nov 11, 2012  9:12 PM       Please report to patient.  The recent labs are stable. Continue same medication and careful diet. ------

## 2012-11-13 NOTE — Telephone Encounter (Signed)
Advised patient of lab results  

## 2012-11-21 ENCOUNTER — Other Ambulatory Visit: Payer: Self-pay

## 2012-11-21 DIAGNOSIS — E785 Hyperlipidemia, unspecified: Secondary | ICD-10-CM

## 2012-11-21 DIAGNOSIS — I119 Hypertensive heart disease without heart failure: Secondary | ICD-10-CM

## 2012-11-21 MED ORDER — ATORVASTATIN CALCIUM 20 MG PO TABS: 20.0000 mg | ORAL_TABLET | Freq: Every day | ORAL | Status: DC

## 2012-11-21 MED ORDER — LOSARTAN POTASSIUM 100 MG PO TABS: 100.0000 mg | ORAL_TABLET | Freq: Every day | ORAL | Status: DC

## 2012-11-21 MED ORDER — METOPROLOL TARTRATE 25 MG PO TABS: 25.0000 mg | ORAL_TABLET | Freq: Two times a day (BID) | ORAL | Status: DC

## 2013-01-28 ENCOUNTER — Encounter: Payer: Self-pay | Admitting: Cardiology

## 2013-03-10 ENCOUNTER — Other Ambulatory Visit: Payer: Medicare Other

## 2013-03-10 ENCOUNTER — Ambulatory Visit: Payer: Medicare Other | Admitting: Cardiology

## 2013-03-13 ENCOUNTER — Ambulatory Visit (INDEPENDENT_AMBULATORY_CARE_PROVIDER_SITE_OTHER): Payer: Medicare Other | Admitting: Cardiology

## 2013-03-13 ENCOUNTER — Other Ambulatory Visit: Payer: Medicare Other

## 2013-03-13 ENCOUNTER — Encounter: Payer: Self-pay | Admitting: Cardiology

## 2013-03-13 VITALS — BP 110/78 | HR 60 | Ht 72.0 in | Wt 185.0 lb

## 2013-03-13 DIAGNOSIS — E7800 Pure hypercholesterolemia, unspecified: Secondary | ICD-10-CM

## 2013-03-13 DIAGNOSIS — I259 Chronic ischemic heart disease, unspecified: Secondary | ICD-10-CM

## 2013-03-13 DIAGNOSIS — I2510 Atherosclerotic heart disease of native coronary artery without angina pectoris: Secondary | ICD-10-CM

## 2013-03-13 DIAGNOSIS — I119 Hypertensive heart disease without heart failure: Secondary | ICD-10-CM

## 2013-03-13 LAB — HEPATIC FUNCTION PANEL
ALT: 34 U/L (ref 0–53)
AST: 31 U/L (ref 0–37)
Albumin: 4.4 g/dL (ref 3.5–5.2)
Alkaline Phosphatase: 56 U/L (ref 39–117)
Bilirubin, Direct: 0.2 mg/dL (ref 0.0–0.3)
Total Bilirubin: 1 mg/dL (ref 0.3–1.2)
Total Protein: 7.1 g/dL (ref 6.0–8.3)

## 2013-03-13 LAB — LIPID PANEL
Cholesterol: 126 mg/dL (ref 0–200)
HDL: 53.6 mg/dL (ref >39.00)
LDL Cholesterol: 54 mg/dL (ref 0–99)
Total CHOL/HDL Ratio: 2
Triglycerides: 92 mg/dL (ref 0.0–149.0)
VLDL: 18.4 mg/dL (ref 0.0–40.0)

## 2013-03-13 LAB — BASIC METABOLIC PANEL
BUN: 12 mg/dL (ref 6–23)
CO2: 31 mEq/L (ref 19–32)
Calcium: 9.7 mg/dL (ref 8.4–10.5)
Chloride: 104 mEq/L (ref 96–112)
Creatinine, Ser: 1.3 mg/dL (ref 0.4–1.5)
GFR: 72.69 mL/min (ref >60.00)
Glucose, Bld: 117 mg/dL — ABNORMAL HIGH (ref 70–99)
Potassium: 4.3 mEq/L (ref 3.5–5.1)
Sodium: 140 mEq/L (ref 135–145)

## 2013-03-13 LAB — CBC WITH DIFFERENTIAL/PLATELET
Basophils Absolute: 0 10*3/uL (ref 0.0–0.1)
Basophils Relative: 0.6 % (ref 0.0–3.0)
Eosinophils Absolute: 0.1 10*3/uL (ref 0.0–0.7)
Eosinophils Relative: 2.2 % (ref 0.0–5.0)
HCT: 42.6 % (ref 39.0–52.0)
Hemoglobin: 14.5 g/dL (ref 13.0–17.0)
Lymphocytes Relative: 30.3 % (ref 12.0–46.0)
Lymphs Abs: 1.5 10*3/uL (ref 0.7–4.0)
MCHC: 34 g/dL (ref 30.0–36.0)
MCV: 93.5 fl (ref 78.0–100.0)
Monocytes Absolute: 0.4 10*3/uL (ref 0.1–1.0)
Monocytes Relative: 7.4 % (ref 3.0–12.0)
Neutro Abs: 3 10*3/uL (ref 1.4–7.7)
Neutrophils Relative %: 59.5 % (ref 43.0–77.0)
Platelets: 134 10*3/uL — ABNORMAL LOW (ref 150.0–400.0)
RBC: 4.56 Mil/uL (ref 4.22–5.81)
RDW: 12.9 % (ref 11.5–14.6)
WBC: 5 10*3/uL (ref 4.5–10.5)

## 2013-03-13 NOTE — Assessment & Plan Note (Signed)
The patient has had occasional left-sided chest wall soreness not related to exertion.  He has taken an occasional nitroglycerin.  He exercises at the [facility] and is not having any exertional chest discomfort.

## 2013-03-13 NOTE — Progress Notes (Signed)
Quick Note:  Please report to patient. The recent labs are stable. Continue same medication and careful diet. ______ 

## 2013-03-13 NOTE — Assessment & Plan Note (Signed)
Patient has a history of hypercholesterolemia.  We're checking lab work today.

## 2013-03-13 NOTE — Assessment & Plan Note (Signed)
Blood pressure remained stable on current therapy.  No headaches.  No dizziness or syncope.

## 2013-03-13 NOTE — Progress Notes (Signed)
Gilbert Harris Date of Birth:  07/11/1945 [address] [place], [place]  [address] [phone]  Fax   [phone]  HPI: This pleasant 67 year old gentleman is seen for a scheduled four-month followup office visit. He has a past history of ischemic heart disease. He underwent coronary artery bypass graft surgery in 2007. He had a previous remote inferior wall myocardial infarction. His last stress test was in 2009 and was negative. The patient has a history of high blood pressure and hypercholesterolemia. He also has psychiatric problems and is followed by Dr. Ladona Ridgel his psychiatrist about every 4 months.  Since last visit the patient has been doing well. He walks at least twice a week in his neighborhood for exercise. He lives in a group home called [facility] and he has lived there for a total of 20 years. Psychiatric diagnosis is anxiety and depression according to the patient. Since last visit he has had no new cardiac symptoms.   Current Outpatient Prescriptions  Medication Sig Dispense Refill  . aspirin 325 MG tablet Take 81 mg by mouth daily.       Marland Kitchen atorvastatin (LIPITOR) 20 MG tablet Take 1 tablet (20 mg total) by mouth daily.  90 tablet  3  . LORazepam (ATIVAN) 1 MG tablet Take 1 mg by mouth daily.        Marland Kitchen losartan (COZAAR) 100 MG tablet Take 1 tablet (100 mg total) by mouth daily.  90 tablet  3  . metoprolol tartrate (LOPRESSOR) 25 MG tablet Take 1 tablet (25 mg total) by mouth 2 (two) times daily.  180 tablet  3  . Multiple Vitamin (MULTIVITAMIN) tablet Take 1 tablet by mouth daily.        Marland Kitchen NITROSTAT 0.4 MG SL tablet ONE TABLET UNDER TONGUE AS NEEDED FOR CHEST PAIN EVERY 5 MINUTES  25 tablet  2  . thioridazine (MELLARIL) 25 MG tablet Take 25 mg by mouth 2 (two) times daily.         No current facility-administered medications for this visit.    Allergies  Allergen Reactions  . Haloperidol Decanoate     Patient Active Problem List   Diagnosis Date  Noted  . CAD 08/13/2008    Priority: High  . HYPERCHOLESTEROLEMIA 08/13/2008    Priority: Medium  . 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  . HYPERTHYROIDISM 08/13/2008  . UNSPECIFIED ESSENTIAL HYPERTENSION 08/13/2008    History  Smoking status  . Former Smoker  Smokeless tobacco  . Not on file    History  Alcohol Use No    Family History  Problem Relation Age of Onset  . Heart disease Mother   . Heart disease Father   . Heart failure Maternal Aunt     Review of Systems: The patient denies any heat or cold intolerance.  No weight gain or weight loss.  The patient denies headaches or blurry vision.  There is no cough or sputum production.  The patient denies dizziness.  There is no hematuria or hematochezia.  The patient denies any muscle aches or arthritis.  The patient denies any rash.  The patient denies frequent falling or instability.  There is no history of depression or anxiety.  All other systems were reviewed and are negative.   Physical Exam: Filed Vitals:   03/13/13 1000  BP: 110/78  Pulse: 60   The general appearance reveals a well-developed well-nourished gentleman in no distress.The head and neck exam reveals pupils equal  and reactive.  Extraocular movements are full.  There is no scleral icterus.  The mouth and pharynx are normal.  The neck is supple.  The carotids reveal no bruits.  The jugular venous pressure is normal.  The  thyroid is not enlarged.  There is no lymphadenopathy.  The chest is clear to percussion and auscultation.  There are no rales or rhonchi.  Expansion of the chest is symmetrical.  The precordium is quiet.  The first heart sound is normal.  The second heart sound is physiologically split.  There is no murmur gallop rub or click.  There is no abnormal lift or heave.  The abdomen is soft and nontender.  The bowel sounds are normal.  The liver and spleen are not enlarged.  There are no abdominal masses.  There are no  abdominal bruits.  Extremities reveal good pedal pulses.  There is no phlebitis or edema.  There is no cyanosis or clubbing.  Strength is normal and symmetrical in all extremities.  There is no lateralizing weakness.  There are no sensory deficits.  The skin is warm and dry.  There is no rash.    Assessment / Plan: Continue on same medication.  Return in 4 months for followup office visit.  If his labs are satisfactory today, we will not need to do labs at his next visit.

## 2013-03-13 NOTE — Patient Instructions (Signed)
Will obtain labs today and call you with the results (lp/bmet/hfp/cbc)  Your physician recommends that you continue on your current medications as directed. Please refer to the Current Medication list given to you today.  Your physician wants you to follow-up in: 4 month ov You will receive a reminder letter in the mail two months in advance. If you don't receive a letter, please call our office to schedule the follow-up appointment.  

## 2013-03-14 ENCOUNTER — Telehealth: Payer: Self-pay | Admitting: *Deleted

## 2013-03-14 NOTE — Telephone Encounter (Signed)
Message copied by Burnell Blanks on Fri Mar 14, 2013  5:21 PM ------      Message from: Cassell Clement      Created: Thu Mar 13, 2013  6:55 PM       Please report to patient.  The recent labs are stable. Continue same medication and careful diet. ------

## 2013-03-14 NOTE — Telephone Encounter (Signed)
Advised patient of lab results  

## 2013-03-28 ENCOUNTER — Telehealth: Payer: Self-pay | Admitting: Cardiology

## 2013-03-28 NOTE — Telephone Encounter (Signed)
New Message  Pt called--- requests a script to have as [facility]ingles shot [facility] [address] [phone]. Please assist

## 2013-03-28 NOTE — Telephone Encounter (Signed)
I spoke with the pt and made him aware that Dr Patty SermonsBrackbill is not in the office today.  I will forward this message to the MD and [facility] to follow-up with the pt if shingles vaccine can be ordered. Pt agreed with plan.

## 2013-03-28 NOTE — Telephone Encounter (Signed)
Okay to call in the shingles vaccine.

## 2013-03-31 NOTE — Telephone Encounter (Signed)
Called [facility] and no Rx needed for patients over the age of 68. Advised patient

## 2013-05-14 ENCOUNTER — Ambulatory Visit: Payer: Medicare Other | Admitting: Internal Medicine

## 2013-05-15 ENCOUNTER — Ambulatory Visit: Payer: Medicare Other | Admitting: Internal Medicine

## 2013-06-05 ENCOUNTER — Ambulatory Visit (INDEPENDENT_AMBULATORY_CARE_PROVIDER_SITE_OTHER): Payer: Medicare Other | Admitting: Internal Medicine

## 2013-06-05 ENCOUNTER — Encounter: Payer: Self-pay | Admitting: Internal Medicine

## 2013-06-05 ENCOUNTER — Other Ambulatory Visit: Payer: Medicare Other

## 2013-06-05 VITALS — BP 123/95 | HR 60 | Temp 98.7°F | Resp 20 | Ht 72.0 in | Wt 190.0 lb

## 2013-06-05 DIAGNOSIS — R739 Hyperglycemia, unspecified: Secondary | ICD-10-CM

## 2013-06-05 DIAGNOSIS — I10 Essential (primary) hypertension: Secondary | ICD-10-CM

## 2013-06-05 DIAGNOSIS — R7309 Other abnormal glucose: Secondary | ICD-10-CM

## 2013-06-05 DIAGNOSIS — Z0000 Encounter for general adult medical examination without abnormal findings: Secondary | ICD-10-CM

## 2013-06-05 LAB — BASIC METABOLIC PANEL
BUN: 12 mg/dL (ref 6–23)
CO2: 30 mEq/L (ref 19–32)
Calcium: 9.4 mg/dL (ref 8.4–10.5)
Chloride: 103 mEq/L (ref 96–112)
Creat: 1.36 mg/dL — ABNORMAL HIGH (ref 0.50–1.35)
Glucose, Bld: 86 mg/dL (ref 70–99)
Potassium: 4.2 mEq/L (ref 3.5–5.3)
Sodium: 138 mEq/L (ref 135–145)

## 2013-06-05 LAB — TSH: TSH: 1.703 u[IU]/mL (ref 0.350–4.500)

## 2013-06-05 LAB — HEMOGLOBIN A1C
Hgb A1c MFr Bld: 5.6 % (ref <5.7)
Mean Plasma Glucose: 114 mg/dL (ref <117)

## 2013-06-05 NOTE — Progress Notes (Addendum)
Subjective:    Patient ID: Gilbert Harris, male    DOB: 05/19/1945, 68 y.o.   MRN: [id number]  HPI: Pt her to establish care. He also c/o intermittently throbbing pain in the LLQ and groin area which occurs intermittently. Occurs randomly and with any or no particular activity or positioning. He is unable to identify any provocative or palliative factors. It usually resolves on it's own and there are no associated symptoms. In particular there are no palpitations.  Pt denies current chest pain but has occasional chest discomfort that has been addressed by his Cardiologist as deemed to be chest wall pain.  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    Review of Systems  Constitutional: Negative.   HENT: Negative.   Eyes: Negative.   Respiratory: Negative.   Gastrointestinal: Positive for abdominal pain (Throbbibg pain in LLQ and groin ).  Endocrine: Negative.   Genitourinary: Negative.   Musculoskeletal:       Chest wall pain  Skin: Negative.   Allergic/Immunologic: Negative.   Neurological: Negative.   Hematological: Negative.   Psychiatric/Behavioral: Negative.  Negative for sleep disturbance.       Objective:   Physical Exam  Vitals reviewed. Constitutional: He is oriented to person, place, and time. He appears well-developed and well-nourished.  HENT:  Head: Normocephalic and atraumatic.  Eyes: Conjunctivae and EOM are normal. Pupils are equal, round, and reactive to light.  Neck: Normal range of motion. Neck supple. No JVD present. No thyromegaly present.  Cardiovascular: Normal rate and regular rhythm.  Exam reveals no gallop and no friction rub.   No murmur heard. Pulmonary/Chest: Effort normal and breath sounds normal. He has no wheezes. He has no rales. He exhibits no tenderness.  Abdominal: Soft. Bowel sounds are normal. He exhibits no mass.  Abdominal bruit present in epigastric and LLQ region.  Genitourinary:  No  evidence of hernia on valsalva.  Musculoskeletal: Normal range of motion.  Neurological: He is alert and oriented to person, place, and time.  Skin: Skin is warm and dry.  Psychiatric: He has a normal mood and affect. His behavior is normal. Judgment and thought content normal.          Assessment & Plan:  1. Left sided pain and throbbing: Pt found to have some bruit over LLQ. He has no evidence of hernia. Will refer for U/S to evaluate for aneurysm in light of known PAD and history of smoking  2. Abdominal Bruit: Will refer for Abdominal Aortic U/S to evaluate for aneurysm in light of known PAD and history of smoking.  3. HTN: Diastolic BP mildly elevated. Will recheck at next visit. If still elevated will discuss adjusting medications with his Cardiologist Dr. Patty SermonsBrackbill. EKG done by Dr. Sharon SellerBarckbill in the last year.  4. CAD: Pt under care of Dr. Patty SermonsBrackbill. No evaluation of LVH evident. Will request last ECHO from Dr. Yevonne PaxBrackbill's office. If not done will refer to Dr. Sharon SellerBarckbill for ECHO.  5. Hyperglycemia: Will check Hb A1c  6. Mental Health Disorder: Pt [facility] that [facility]. HE is on Thioridizine but states that he has never been diagnosed with Schizophrenia. [facility] records from [facility]   7. Immunizations: Pt has had only a influenza vaccine. He will obtain his Pneumovax and consider Zostavax on next visit.  8. Health Maintenance: Pt has never had a screening colonoscopy so will refer to  GI for screening colonoscopy.

## 2013-06-06 LAB — VITAMIN D 25 HYDROXY (VIT D DEFICIENCY, FRACTURES): Vit D, 25-Hydroxy: 44 ng/mL (ref 30–89)

## 2013-06-09 DIAGNOSIS — Z0000 Encounter for general adult medical examination without abnormal findings: Secondary | ICD-10-CM | POA: Insufficient documentation

## 2013-06-09 DIAGNOSIS — R739 Hyperglycemia, unspecified: Secondary | ICD-10-CM | POA: Insufficient documentation

## 2013-06-09 DIAGNOSIS — I10 Essential (primary) hypertension: Secondary | ICD-10-CM | POA: Insufficient documentation

## 2013-06-09 NOTE — Progress Notes (Addendum)
Patient ID: Gilbert DivineWilliam E Harris, male   DOB: 1945-11-17, 68 y.o.   MRN: [id number] Labs from this encounter reviewed. TSH WNL's. Pt has a mild elevation in Cr. Will speak with Dr. Everardo AllEllison about the diagnosis of hypothyroidism. Vitamin D levels and Hb A1c WNL's.

## 2013-06-11 ENCOUNTER — Ambulatory Visit (INDEPENDENT_AMBULATORY_CARE_PROVIDER_SITE_OTHER): Payer: Medicare Other | Admitting: Family Medicine

## 2013-06-11 ENCOUNTER — Telehealth: Payer: Self-pay

## 2013-06-11 ENCOUNTER — Encounter: Payer: Self-pay | Admitting: Family Medicine

## 2013-06-11 VITALS — BP 124/92 | HR 63 | Temp 98.0°F | Resp 20 | Ht 72.0 in | Wt 190.0 lb

## 2013-06-11 DIAGNOSIS — I10 Essential (primary) hypertension: Secondary | ICD-10-CM

## 2013-06-11 DIAGNOSIS — R0989 Other specified symptoms and signs involving the circulatory and respiratory systems: Secondary | ICD-10-CM

## 2013-06-11 DIAGNOSIS — R1032 Left lower quadrant pain: Secondary | ICD-10-CM

## 2013-06-11 NOTE — Telephone Encounter (Signed)
Pt's U/S has been set up for 07/18/2013@9 :30A.M. Per request of NP Hollis.Pt has already been alerted of Appointment.

## 2013-06-11 NOTE — Progress Notes (Signed)
   Subjective:    Patient ID: Gilbert Harris, male    DOB: 03-09-1946, 68 y.o.   MRN: [id number]  HPI Patient is in office following up for intermittent left lower quadrant pain. Patient states that pain is in the LLQ and groin area. Pain is described as non-radiating and throbbing in nature. Patient is unable to identify any provacative or palliative factors. Maintains that the pain typically resolves and there are no associated symptoms. Patient denies nausea, vomiting, diarrhea, and constipation.   Review of Systems  Constitutional: Negative.   Eyes: Negative.   Respiratory: Negative.   Cardiovascular: Negative.   Gastrointestinal: Positive for abdominal pain (Intermittent LLQ). Negative for nausea, vomiting, diarrhea, constipation, blood in stool, abdominal distention, anal bleeding and rectal pain.  Endocrine: Negative.   Genitourinary: Negative.   Musculoskeletal: Positive for back pain (occasional).  Skin: Negative.   Allergic/Immunologic: Negative.   Neurological: Negative.   Hematological: Negative.   Psychiatric/Behavioral: The patient is not nervous/anxious.        Objective:   Physical Exam  Constitutional: He is oriented to person, place, and time. He appears well-developed and well-nourished.  HENT:  Head: Normocephalic and atraumatic.  Eyes: Conjunctivae are normal. Pupils are equal, round, and reactive to light.  Neck: Normal range of motion. Neck supple.  Cardiovascular: Normal rate and regular rhythm.   Pulmonary/Chest: Effort normal and breath sounds normal.  Abdominal: Soft. Normal appearance. He exhibits abdominal bruit. He exhibits no pulsatile midline mass and no mass. Bowel sounds are increased. There is hepatomegaly. There is no splenomegaly. There is no tenderness. There is no CVA tenderness. No hernia.  Musculoskeletal: Normal range of motion.  Neurological: He is alert and oriented to person, place, and time.  Skin: Skin is warm and dry.  Psychiatric: He  has a normal mood and affect. His behavior is normal.          Assessment & Plan:  1. Left sided pain and throbbing: Pt found to have some bruit over LLQ.  Referred for U/S to evaluate for aneurysm in light of known PAD and history of smoking   2. Abdominal Bruit: Will refer for Abdominal Aortic U/S to evaluate for aneurysm in light of known PAD and history of smoking.  3. Hypertension: Controlled on current medication regimen. Patient is followed by cardiologist. Recommend that patient continues a lowfat, low sodium diet.    RTC: Complete physical examination in 1 month with Dr. Ashley RoyaltyMatthews

## 2013-06-11 NOTE — Telephone Encounter (Signed)
Pt referal was set up through Dr.Hung's office to be seen on 06/18/2013@10 :15 A.M Per Dr.Matthews for Colonoscopy(Int.Visit)

## 2013-06-13 ENCOUNTER — Other Ambulatory Visit: Payer: Self-pay

## 2013-06-13 MED ORDER — NITROGLYCERIN 0.4 MG SL SUBL
SUBLINGUAL_TABLET | SUBLINGUAL | Status: DC
Start: 2013-06-13 — End: 2015-04-22

## 2013-06-17 ENCOUNTER — Ambulatory Visit (HOSPITAL_COMMUNITY)
Admission: RE | Admit: 2013-06-17 | Discharge: 2013-06-17 | Disposition: A | Discharge status: Home / Self Care | Payer: Medicare Other | Source: Ambulatory Visit | Attending: Family Medicine | Admitting: Family Medicine

## 2013-06-17 DIAGNOSIS — K7689 Other specified diseases of liver: Secondary | ICD-10-CM | POA: Insufficient documentation

## 2013-06-17 DIAGNOSIS — R109 Unspecified abdominal pain: Secondary | ICD-10-CM | POA: Insufficient documentation

## 2013-06-17 DIAGNOSIS — R1032 Left lower quadrant pain: Secondary | ICD-10-CM

## 2013-06-18 ENCOUNTER — Other Ambulatory Visit: Payer: Self-pay | Admitting: Gastroenterology

## 2013-06-18 DIAGNOSIS — R935 Abnormal findings on diagnostic imaging of other abdominal regions, including retroperitoneum: Secondary | ICD-10-CM

## 2013-06-25 ENCOUNTER — Inpatient Hospital Stay: Admission: RE | Admit: 2013-06-25 | Payer: Medicare Other | Source: Ambulatory Visit

## 2013-06-27 ENCOUNTER — Telehealth (HOSPITAL_COMMUNITY): Payer: Self-pay | Admitting: Family Medicine

## 2013-06-27 ENCOUNTER — Other Ambulatory Visit (HOSPITAL_COMMUNITY): Payer: Self-pay | Admitting: Family Medicine

## 2013-06-27 DIAGNOSIS — K769 Liver disease, unspecified: Secondary | ICD-10-CM

## 2013-06-27 NOTE — Telephone Encounter (Signed)
Patient has appointment for Liver [facility] with contrast [facility] 06/28/2013 [facility] that was ordered by Dr. Jeani HawkingPatrick Hung.

## 2013-06-28 ENCOUNTER — Ambulatory Visit
Admission: RE | Admit: 2013-06-28 | Discharge: 2013-06-28 | Disposition: A | Discharge status: Home / Self Care | Payer: Medicare Other | Source: Ambulatory Visit | Attending: Gastroenterology | Admitting: Gastroenterology

## 2013-06-28 DIAGNOSIS — R935 Abnormal findings on diagnostic imaging of other abdominal regions, including retroperitoneum: Secondary | ICD-10-CM

## 2013-06-28 MED ORDER — GADOBENATE DIMEGLUMINE 529 MG/ML IV SOLN
15.0000 mL | Freq: Once | INTRAVENOUS | Status: AC | PRN
  Administered 2013-06-28: 15 mL via INTRAVENOUS

## 2013-07-10 ENCOUNTER — Encounter: Payer: Self-pay | Admitting: Internal Medicine

## 2013-07-10 ENCOUNTER — Ambulatory Visit (INDEPENDENT_AMBULATORY_CARE_PROVIDER_SITE_OTHER): Payer: Medicare Other | Admitting: Internal Medicine

## 2013-07-10 VITALS — BP 119/86 | HR 60 | Temp 97.5°F | Resp 20 | Ht 72.0 in | Wt 184.0 lb

## 2013-07-10 DIAGNOSIS — I10 Essential (primary) hypertension: Secondary | ICD-10-CM

## 2013-07-10 DIAGNOSIS — Z23 Encounter for immunization: Secondary | ICD-10-CM | POA: Insufficient documentation

## 2013-07-10 DIAGNOSIS — D696 Thrombocytopenia, unspecified: Secondary | ICD-10-CM

## 2013-07-10 DIAGNOSIS — E559 Vitamin D deficiency, unspecified: Secondary | ICD-10-CM

## 2013-07-10 LAB — CBC WITH DIFFERENTIAL/PLATELET
Basophils Absolute: 0 10*3/uL (ref 0.0–0.1)
Basophils Relative: 1 % (ref 0–1)
Eosinophils Absolute: 0.2 10*3/uL (ref 0.0–0.7)
Eosinophils Relative: 5 % (ref 0–5)
HCT: 42.3 % (ref 39.0–52.0)
Hemoglobin: 15.1 g/dL (ref 13.0–17.0)
Lymphocytes Relative: 41 % (ref 12–46)
Lymphs Abs: 2 10*3/uL (ref 0.7–4.0)
MCH: 31.7 pg (ref 26.0–34.0)
MCHC: 35.7 g/dL (ref 30.0–36.0)
MCV: 88.9 fL (ref 78.0–100.0)
Monocytes Absolute: 0.5 10*3/uL (ref 0.1–1.0)
Monocytes Relative: 10 % (ref 3–12)
Neutro Abs: 2.1 10*3/uL (ref 1.7–7.7)
Neutrophils Relative %: 43 % (ref 43–77)
Platelets: 142 10*3/uL — ABNORMAL LOW (ref 150–400)
RBC: 4.76 MIL/uL (ref 4.22–5.81)
RDW: 13.6 % (ref 11.5–15.5)
WBC: 4.8 10*3/uL (ref 4.0–10.5)

## 2013-07-10 MED ORDER — VITAMIN D 50 MCG (2000 UT) PO CAPS: 2000.0000 [IU] | ORAL_CAPSULE | Freq: Every day | ORAL | Status: DC

## 2013-07-10 NOTE — Progress Notes (Signed)
   Subjective:    Patient ID: Gilbert Harris, male    DOB: 17-Aug-1945, 68 y.o.   MRN: [id number]  HPI: Pt here for annual Physical Examination and follow up of abdominal pain.  He states that he feels well. He has not had any more pain in the abdomen and he had had regular bowel movements. He denies any blood in stool or urine.   Pt has a testicular cyst and is followed by Dr. Nesi-Urology.  He had his colonoscopy done on Tuesday (2 days ago).   He had known CAD and is S/P Bypass. He is on an ASA and B-blocker and Lipitor 20 mg daily.  Pt is due for immunizations TDaP and Zostavax.     Review of Systems  Constitutional: Negative.   HENT: Negative.   Eyes: Negative.   Respiratory: Negative.   Cardiovascular: Negative.   Gastrointestinal: Negative.  Negative for abdominal pain.  Endocrine: Negative.   Genitourinary: Negative.   Skin: Negative.   Allergic/Immunologic: Negative.   Neurological: Negative.   Hematological: Negative.   Psychiatric/Behavioral: Negative.        Objective:   Physical Exam  Vitals reviewed. Constitutional: He is oriented to person, place, and time. He appears well-developed and well-nourished.  HENT:  Head: Atraumatic.  Eyes: Conjunctivae and EOM are normal. Pupils are equal, round, and reactive to light. No scleral icterus.  Neck: Normal range of motion. Neck supple.  Cardiovascular: Normal rate and regular rhythm.  Exam reveals no gallop and no friction rub.   No murmur heard. Pulmonary/Chest: Effort normal and breath sounds normal. He has no wheezes. He has no rales. He exhibits no tenderness.  Abdominal: Soft. Bowel sounds are normal. He exhibits no distension and no mass. There is no tenderness.  Musculoskeletal: Normal range of motion.  Neurological: He is alert and oriented to person, place, and time.  Skin: Skin is warm and dry.  Onychorrhexis noted on fingernails.  Psychiatric: He has a normal mood and affect. His behavior is normal.  Judgment and thought content normal.          Assessment & Plan:  1. Annual Physical: Pt states that he is feeling well. He has had his colonoscopy and    -Lipid panel in 02/2013 WNL's.   - CAD S/P CABG- needs to be on high dose lipitor. Will review doses and adjust as necessary   - Check EKG ( Pt will return next week to have it done)   - Pt to have vision examination in next month   2. Vitamin D insuffeciency: Will start vitamin D 2,000 Units  3. Thrombocytopenia: Will re check platelets. Maybe associated with thioridazine  4. HTN: BP well controlled. Renal function and electrolytes within last month were WNL's.  5. CAD: no evidence of CHF. No indication for ECHO. Will check EKG.   6. Schizophrenia: Pt well adjusted and under care [facility]. He is on Thioridazine.  RTC: 1 month  Labs: CBC with diff  Immunizations: TDAP, Pt to return for Zostavax  Procedures: Pt to return for EKG.  Altha HarmMichelle A Wadsworth Skolnick

## 2013-07-15 ENCOUNTER — Ambulatory Visit (INDEPENDENT_AMBULATORY_CARE_PROVIDER_SITE_OTHER): Payer: Medicare Other | Admitting: Cardiology

## 2013-07-15 ENCOUNTER — Telehealth: Payer: Self-pay | Admitting: Cardiology

## 2013-07-15 ENCOUNTER — Encounter: Payer: Self-pay | Admitting: Cardiology

## 2013-07-15 VITALS — BP 160/91 | HR 60 | Ht 72.0 in | Wt 186.0 lb

## 2013-07-15 DIAGNOSIS — I2510 Atherosclerotic heart disease of native coronary artery without angina pectoris: Secondary | ICD-10-CM

## 2013-07-15 DIAGNOSIS — E7800 Pure hypercholesterolemia, unspecified: Secondary | ICD-10-CM

## 2013-07-15 DIAGNOSIS — I119 Hypertensive heart disease without heart failure: Secondary | ICD-10-CM

## 2013-07-15 DIAGNOSIS — I259 Chronic ischemic heart disease, unspecified: Secondary | ICD-10-CM

## 2013-07-15 NOTE — Telephone Encounter (Signed)
Faxed as requested

## 2013-07-15 NOTE — Assessment & Plan Note (Signed)
The patient has not been experiencing any recurrent chest pain or angina.  His EKG today shows sinus bradycardia and no ischemic changes.

## 2013-07-15 NOTE — Patient Instructions (Signed)
DECREASE YOUR SALT IN DIET  Your physician recommends that you continue on your current medications as directed. Please refer to the Current Medication list given to you today.  Your physician wants you to follow-up in: 6 months with fasting labs (lp/bmet/hfp)  You will receive a reminder letter in the mail two months in advance. If you don't receive a letter, please call our office to schedule the follow-up appointment.

## 2013-07-15 NOTE — Assessment & Plan Note (Addendum)
Blood pressure has been remaining stable on current therapy.  No headaches dizziness or syncope.  Today his blood pressure is a little high and he thinks he has been eating more salt than usual.

## 2013-07-15 NOTE — Telephone Encounter (Signed)
New Message  Dr. Ashley RoyaltyMatthews office [facility] is requesting the latest EKG for this pt. Please fax to [phone]

## 2013-07-15 NOTE — Progress Notes (Signed)
Gilbert DivineWilliam E Dicicco Date of Birth:  [birth year] [address] [place], [place]  [address] [phone]  Fax   [phone]  HPI: This pleasant 68 year old gentleman is seen for a scheduled four-month followup office visit. He has a past history of ischemic heart disease. He underwent coronary artery bypass graft surgery in 2007. He had a previous remote inferior wall myocardial infarction. His last stress test was in 2009 and was negative. The patient has a history of high blood pressure and hypercholesterolemia. He also has psychiatric problems and is followed by Dr. Ladona Ridgelaylor his psychiatrist about every 4 months.  Since last visit the patient has been doing well. He walks at least twice a week in his neighborhood for exercise. He lives in a group home called [facility] and he has lived there for a total of 20 years. Psychiatric diagnosis is anxiety and depression according to the patient. Since last visit he has had no new cardiac symptoms. Since last visit the patient now has a PCP.  He recently had colonoscopy with removal of 6 polyps by Dr. Elnoria HowardHung.  He also had an MRI of the abdomen and ultrasound of the abdomen both of which were unrevealing according to the patient.  He had had some previous left lower quadrant discomfort which has now resolved without specific therapy.   Current Outpatient Prescriptions  Medication Sig Dispense Refill  . aspirin 81 MG tablet Take 81 mg by mouth daily.      Marland Kitchen. atorvastatin (LIPITOR) 20 MG tablet Take 1 tablet (20 mg total) by mouth daily.  90 tablet  3  . Cholecalciferol (VITAMIN D) 2000 UNITS CAPS Take 1 capsule (2,000 Units total) by mouth daily.  30 capsule  11  . hydrOXYzine (ATARAX/VISTARIL) 50 MG tablet Take 50 mg by mouth at bedtime.      Marland Kitchen. losartan (COZAAR) 100 MG tablet Take 1 tablet (100 mg total) by mouth daily.  90 tablet  3  . metoprolol tartrate (LOPRESSOR) 25 MG tablet Take 1 tablet (25 mg total) by mouth 2 (two) times daily.  180  tablet  3  . Multiple Vitamin (MULTIVITAMIN) tablet Take 1 tablet by mouth daily.        . nitroGLYCERIN (NITROSTAT) 0.4 MG SL tablet ONE TABLET UNDER TONGUE AS NEEDED FOR CHEST PAIN EVERY 5 MINUTES  25 tablet  2  . thioridazine (MELLARIL) 25 MG tablet Take 25 mg by mouth 2 (two) times daily.        Marland Kitchen. aspirin 325 MG tablet Take 81 mg by mouth daily.        No current facility-administered medications for this visit.    Allergies  Allergen Reactions  . Haloperidol Decanoate     Patient Active Problem List   Diagnosis Date Noted  . CAD 08/13/2008    Priority: High  . HYPERCHOLESTEROLEMIA 08/13/2008    Priority: Medium  . Vitamin D insufficiency 07/10/2013  . Need for Tdap vaccination 07/10/2013  . Thrombocytopenia 07/10/2013  . HTN (hypertension) 06/09/2013  . Annual physical exam 06/09/2013  . Hyperglycemia 06/09/2013  . 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  . HYPERTHYROIDISM 08/13/2008  . UNSPECIFIED ESSENTIAL HYPERTENSION 08/13/2008    History  Smoking status  . Former Smoker  . Types: Cigarettes  Smokeless tobacco  . Not on file    History  Alcohol Use No    Family History  Problem Relation Age of Onset  . Heart disease Mother   . Heart  disease Father   . Heart failure Maternal Aunt     Review of Systems: The patient denies any heat or cold intolerance.  No weight gain or weight loss.  The patient denies headaches or blurry vision.  There is no cough or sputum production.  The patient denies dizziness.  There is no hematuria or hematochezia.  The patient denies any muscle aches or arthritis.  The patient denies any rash.  The patient denies frequent falling or instability.  There is no history of depression or anxiety.  All other systems were reviewed and are negative.   Physical Exam: Filed Vitals:   07/15/13 0911  BP: 160/91  Pulse: 60   The general appearance reveals a well-developed well-nourished gentleman in no  distress.The head and neck exam reveals pupils equal and reactive.  Extraocular movements are full.  There is no scleral icterus.  The mouth and pharynx are normal.  The neck is supple.  The carotids reveal no bruits.  The jugular venous pressure is normal.  The  thyroid is not enlarged.  There is no lymphadenopathy.  The chest is clear to percussion and auscultation.  There are no rales or rhonchi.  Expansion of the chest is symmetrical.  The precordium is quiet.  The first heart sound is normal.  The second heart sound is physiologically split.  There is no murmur gallop rub or click.  There is no abnormal lift or heave.  The abdomen is soft and nontender.  The bowel sounds are normal.  The liver and spleen are not enlarged.  There are no abdominal masses.  There are no abdominal bruits.  Extremities reveal good pedal pulses.  There is no phlebitis or edema.  There is no cyanosis or clubbing.  Strength is normal and symmetrical in all extremities.  There is no lateralizing weakness.  There are no sensory deficits.  The skin is warm and dry.  There is no rash.  EKG shows sinus bradycardia.  No ischemic changes.  Assessment / Plan: Continue on same medication.  Recheck in 6 months for followup office visit lipid panel hepatic function panel and basal metabolic panel.

## 2013-08-08 ENCOUNTER — Ambulatory Visit: Payer: Medicare Other | Admitting: Family Medicine

## 2013-08-11 ENCOUNTER — Ambulatory Visit (INDEPENDENT_AMBULATORY_CARE_PROVIDER_SITE_OTHER): Payer: Medicare Other | Admitting: Family Medicine

## 2013-08-11 ENCOUNTER — Encounter: Payer: Self-pay | Admitting: Family Medicine

## 2013-08-11 ENCOUNTER — Other Ambulatory Visit: Payer: Self-pay | Admitting: Family Medicine

## 2013-08-11 VITALS — BP 122/85 | HR 94 | Temp 98.2°F | Resp 20 | Wt 188.0 lb

## 2013-08-11 DIAGNOSIS — D696 Thrombocytopenia, unspecified: Secondary | ICD-10-CM

## 2013-08-11 DIAGNOSIS — I119 Hypertensive heart disease without heart failure: Secondary | ICD-10-CM

## 2013-08-11 DIAGNOSIS — I10 Essential (primary) hypertension: Secondary | ICD-10-CM

## 2013-08-11 LAB — CBC WITH DIFFERENTIAL/PLATELET
Basophils Absolute: 0 10*3/uL (ref 0.0–0.1)
Basophils Relative: 0 % (ref 0–1)
Eosinophils Absolute: 0.1 10*3/uL (ref 0.0–0.7)
Eosinophils Relative: 2 % (ref 0–5)
HCT: 42.5 % (ref 39.0–52.0)
Hemoglobin: 15.1 g/dL (ref 13.0–17.0)
Lymphocytes Relative: 30 % (ref 12–46)
Lymphs Abs: 1.8 10*3/uL (ref 0.7–4.0)
MCH: 31.8 pg (ref 26.0–34.0)
MCHC: 35.5 g/dL (ref 30.0–36.0)
MCV: 89.5 fL (ref 78.0–100.0)
Monocytes Absolute: 0.5 10*3/uL (ref 0.1–1.0)
Monocytes Relative: 8 % (ref 3–12)
Neutro Abs: 3.6 10*3/uL (ref 1.7–7.7)
Neutrophils Relative %: 60 % (ref 43–77)
Platelets: 144 10*3/uL — ABNORMAL LOW (ref 150–400)
RBC: 4.75 MIL/uL (ref 4.22–5.81)
RDW: 13.5 % (ref 11.5–15.5)
WBC: 6 10*3/uL (ref 4.0–10.5)

## 2013-08-11 NOTE — Progress Notes (Signed)
   Subjective:    Patient ID: Gilbert Harris, male    DOB: May 01, 1945, 68 y.o.   MRN: [id number]  HPI  Patient presents for a 1 month follow-up for hypertension. Patient states that he was seen by cardiologist and had an EKG performed 1 month ago. Reports that he is taking medications consistently and is feeling good. Reports that he has changed diet and started a regular exercise routine.   Review of Systems  Constitutional: Negative.   HENT: Negative.   Eyes: Negative.   Respiratory: Negative.   Cardiovascular: Negative.   Gastrointestinal: Negative.   Endocrine: Negative.   Genitourinary: Negative.   Musculoskeletal: Negative.   Skin: Negative.   Allergic/Immunologic: Negative.   Hematological: Negative.   Psychiatric/Behavioral: Negative.        Objective:   Physical Exam  Constitutional: He is oriented to person, place, and time. He appears well-developed and well-nourished.  HENT:  Head: Normocephalic and atraumatic.  Eyes: Pupils are equal, round, and reactive to light.  Neck: Normal range of motion. Neck supple.  Cardiovascular: Normal rate, regular rhythm and normal heart sounds.   Pulmonary/Chest: Effort normal and breath sounds normal.  Abdominal: Soft. Bowel sounds are normal.  Musculoskeletal: Normal range of motion.  Neurological: He is alert and oriented to person, place, and time.  Skin: Skin is warm and dry.  Psychiatric: He has a normal mood and affect. His behavior is normal. Judgment and thought content normal.          Assessment & Plan:  1. Hypertension:  Stable on current medication regimen. Patient taking a Yoga and Bettter Balance course, at [facility] [employer] [employer] 3 times per week. Patient is currently drinks supplemental shakes and meals to maintain weight. Reports that he drinks 4-5 glasses of water per day. Check CMP  2. CAD: Stable. Reviewed cardiologist notes. Patient to continue current dose of Lipitor. EKG consistent with previous results  (sinus brady w/o ischemia. Patient to follow up with Gilbert Harris in 6 months.   3. Schizophrenia: Patient is followed by Gilbert Harris with Gilbert HareE. Mengetsu, NP on 09/04/2013 4. Thrombocytopenia: Will check platelet count.   Labs: CMP, CBC RTC: 3 months follow up for hypertension with Dr. Ashley RoyaltyMatthews

## 2013-08-12 LAB — COMPLETE METABOLIC PANEL WITH GFR
ALT: 29 U/L (ref 0–53)
AST: 25 U/L (ref 0–37)
Albumin: 4.4 g/dL (ref 3.5–5.2)
Alkaline Phosphatase: 62 U/L (ref 39–117)
BUN: 12 mg/dL (ref 6–23)
CO2: 29 mEq/L (ref 19–32)
Calcium: 9.7 mg/dL (ref 8.4–10.5)
Chloride: 104 mEq/L (ref 96–112)
Creat: 1.2 mg/dL (ref 0.50–1.35)
GFR, Est African American: 72 mL/min
GFR, Est Non African American: 62 mL/min
Glucose, Bld: 96 mg/dL (ref 70–99)
Potassium: 4.7 mEq/L (ref 3.5–5.3)
Sodium: 140 mEq/L (ref 135–145)
Total Bilirubin: 0.7 mg/dL (ref 0.2–1.2)
Total Protein: 7.2 g/dL (ref 6.0–8.3)

## 2013-09-08 ENCOUNTER — Other Ambulatory Visit: Payer: Self-pay

## 2013-09-08 DIAGNOSIS — I119 Hypertensive heart disease without heart failure: Secondary | ICD-10-CM

## 2013-09-08 DIAGNOSIS — E785 Hyperlipidemia, unspecified: Secondary | ICD-10-CM

## 2013-09-08 MED ORDER — LOSARTAN POTASSIUM 100 MG PO TABS: 100.0000 mg | ORAL_TABLET | Freq: Every day | ORAL | Status: DC

## 2013-09-08 MED ORDER — METOPROLOL TARTRATE 25 MG PO TABS: 25.0000 mg | ORAL_TABLET | Freq: Two times a day (BID) | ORAL | Status: DC

## 2013-09-08 MED ORDER — ATORVASTATIN CALCIUM 20 MG PO TABS: 20.0000 mg | ORAL_TABLET | Freq: Every day | ORAL | Status: DC

## 2013-09-22 ENCOUNTER — Telehealth: Payer: Self-pay | Admitting: Cardiology

## 2013-09-22 NOTE — Telephone Encounter (Signed)
He may have just gotten a little dehydrated.  Will continue same medication.  Make sure he drinks enough fluid in the hot weather.  It would be good if he could obtain a home blood pressure monitor.

## 2013-09-22 NOTE — Telephone Encounter (Signed)
No answer, will continue to try to reach patient  

## 2013-09-22 NOTE — Telephone Encounter (Signed)
Advised patient

## 2013-09-22 NOTE — Telephone Encounter (Signed)
Finally able to reach patient. He came home to eat and was feeling much better. He did go to exercise this morning and from the facility took him to the drug store to get his blood pressure. This is where the blood pressure 76/56 taken. Patient does not have a blood pressure machine to monitor his blood pressure at home and facility has no way of checking. Patient stated that he has not been having any dizzy spells or lightheadedness except for earlier today, but again feeling much better now. Will forward to  Dr. Patty SermonsBrackbill for review.

## 2013-09-22 NOTE — Telephone Encounter (Signed)
New message          Pt bp is 76/56 / What should pt do?

## 2013-09-22 NOTE — Telephone Encounter (Signed)
No answer, will try again.

## 2013-09-22 NOTE — Telephone Encounter (Signed)
No answer, will continue to try to reach patient

## 2013-11-11 ENCOUNTER — Ambulatory Visit (INDEPENDENT_AMBULATORY_CARE_PROVIDER_SITE_OTHER): Payer: Medicare Other | Admitting: Family Medicine

## 2013-11-11 ENCOUNTER — Encounter: Payer: Self-pay | Admitting: Family Medicine

## 2013-11-11 VITALS — BP 124/85 | HR 64 | Temp 98.1°F | Resp 16 | Ht 72.0 in | Wt 195.0 lb

## 2013-11-11 DIAGNOSIS — I10 Essential (primary) hypertension: Secondary | ICD-10-CM

## 2013-11-11 DIAGNOSIS — D696 Thrombocytopenia, unspecified: Secondary | ICD-10-CM

## 2013-11-11 DIAGNOSIS — E0590 Thyrotoxicosis, unspecified without thyrotoxic crisis or storm: Secondary | ICD-10-CM

## 2013-11-11 DIAGNOSIS — E7800 Pure hypercholesterolemia, unspecified: Secondary | ICD-10-CM

## 2013-11-11 NOTE — Progress Notes (Signed)
   Subjective:    Patient ID: Gilbert Harris, male    DOB: June 14, 1945, 68 y.o.   MRN: [id number]  Hypertension    Patient presents for a 3 month follow-up for hypertension. Patient states that he was seen by cardiologist and had an EKG performed 3 months ago. Reports that he is taking medications consistently and is feeling good. Reports that he has changed diet and started a regular exercise routine.He states that he goes to [facility] for an exercise program several times per week.  Patient currently denies shortness of breath, dizziness, edema, claudication, chest pains, or nausea.   Past Medical History  Diagnosis Date  . Coronary artery disease     CABG x4 LIMA to LAD, SV graft to RCA, sequential saphenous vein graft to diagnal and OM1  . HTN (hypertension)   . Hyperlipidemia   . Schizophrenia   . MI (myocardial infarction)    Review of Systems  Constitutional: Negative.   HENT: Negative.   Eyes: Negative.   Respiratory: Negative.   Cardiovascular: Negative.   Gastrointestinal: Negative.   Endocrine: Negative.   Genitourinary: Negative.   Musculoskeletal: Negative.   Skin: Negative.   Allergic/Immunologic: Negative.   Hematological: Negative.   Psychiatric/Behavioral: Negative.    BP 124/85  Pulse 64  Temp(Src) 98.1 F (36.7 C) (Oral)  Resp 16  Ht 6' (1.829 m)  Wt 195 lb (88.451 kg)  BMI 26.44 kg/m2    Objective:   Physical Exam  Constitutional: He is oriented to person, place, and time. He appears well-developed and well-nourished.  HENT:  Head: Normocephalic and atraumatic.  Eyes: Pupils are equal, round, and reactive to light.  Neck: Normal range of motion. Neck supple.  Cardiovascular: Normal rate, regular rhythm and normal heart sounds.   Pulmonary/Chest: Effort normal and breath sounds normal.  Abdominal: Soft. Bowel sounds are normal.  Musculoskeletal: Normal range of motion.  Neurological: He is alert and oriented to person, place, and time.  Skin: Skin  is warm and dry.  Psychiatric: He has a normal mood and affect. His behavior is normal. Judgment and thought content normal.     BP 124/85  Pulse 64  Temp(Src) 98.1 F (36.7 C) (Oral)  Resp 16  Ht 6' (1.829 m)  Wt 195 lb (88.451 kg)  BMI 26.44 kg/m2     Assessment & Plan:  1. Hypertension:  Stable on current medication regimen. Patient taking a Yoga and Bettter Balance course, at [facility] [employer] 3 times per week. Patient is currently drinks supplemental shakes and meals to maintain weight. Reports that he drinks 4-5 glasses of water per day. Will check  CMP ( future order). Next appt with cardiologist is in October.   2. CAD: Stable. Reviewed previous cardiology notes. Patient to continue current dose of Lipitor. Reviewed previous EKG . Patient to follow up with Dr. Patty SermonsBrackbill in October.   3. Schizophrenia: Patient is [facility] with Bea LauraE. Mengetsu, NP for follow up on 11/28/2013. Provider added Buspirone for generalized anxiety  during [facility]. He states that he is tolerating medication well. He finds that he is getting 6-8 hours of sleep per night.   4. Thrombocytopenia: Will re-check platelet count prior to next appt in 3 months.     Labs: CMP, CBC  RTC: 3 months follow up for hypertension with Dr. Wynona CanesMatthews   Itzell Bendavid M, FNP

## 2014-01-12 ENCOUNTER — Ambulatory Visit (INDEPENDENT_AMBULATORY_CARE_PROVIDER_SITE_OTHER): Payer: Medicare Other | Admitting: Cardiology

## 2014-01-12 ENCOUNTER — Encounter: Payer: Self-pay | Admitting: Cardiology

## 2014-01-12 VITALS — BP 118/76 | HR 64 | Ht 72.0 in | Wt 205.0 lb

## 2014-01-12 DIAGNOSIS — I2510 Atherosclerotic heart disease of native coronary artery without angina pectoris: Secondary | ICD-10-CM

## 2014-01-12 DIAGNOSIS — I259 Chronic ischemic heart disease, unspecified: Secondary | ICD-10-CM

## 2014-01-12 DIAGNOSIS — E7800 Pure hypercholesterolemia, unspecified: Secondary | ICD-10-CM

## 2014-01-12 DIAGNOSIS — I119 Hypertensive heart disease without heart failure: Secondary | ICD-10-CM

## 2014-01-12 DIAGNOSIS — D696 Thrombocytopenia, unspecified: Secondary | ICD-10-CM

## 2014-01-12 LAB — CBC WITH DIFFERENTIAL/PLATELET
Basophils Absolute: 0.1 10*3/uL (ref 0.0–0.1)
Basophils Relative: 1 % (ref 0–1)
Eosinophils Absolute: 0.2 10*3/uL (ref 0.0–0.7)
Eosinophils Relative: 4 % (ref 0–5)
HCT: 42 % (ref 39.0–52.0)
Hemoglobin: 14.7 g/dL (ref 13.0–17.0)
Lymphocytes Relative: 37 % (ref 12–46)
Lymphs Abs: 2 10*3/uL (ref 0.7–4.0)
MCH: 32 pg (ref 26.0–34.0)
MCHC: 35 g/dL (ref 30.0–36.0)
MCV: 91.5 fL (ref 78.0–100.0)
Monocytes Absolute: 0.7 10*3/uL (ref 0.1–1.0)
Monocytes Relative: 13 % — ABNORMAL HIGH (ref 3–12)
Neutro Abs: 2.5 10*3/uL (ref 1.7–7.7)
Neutrophils Relative %: 45 % (ref 43–77)
Platelets: 153 10*3/uL (ref 150–400)
RBC: 4.59 MIL/uL (ref 4.22–5.81)
RDW: 13.5 % (ref 11.5–15.5)
WBC: 5.5 10*3/uL (ref 4.0–10.5)

## 2014-01-12 LAB — HEPATIC FUNCTION PANEL
ALT: 42 U/L (ref 0–53)
AST: 38 U/L — ABNORMAL HIGH (ref 0–37)
Albumin: 3.6 g/dL (ref 3.5–5.2)
Alkaline Phosphatase: 59 U/L (ref 39–117)
Bilirubin, Direct: 0.1 mg/dL (ref 0.0–0.3)
Total Bilirubin: 0.6 mg/dL (ref 0.2–1.2)
Total Protein: 7.3 g/dL (ref 6.0–8.3)

## 2014-01-12 LAB — LIPID PANEL
Cholesterol: 126 mg/dL (ref 0–200)
HDL: 43 mg/dL (ref >39.00)
LDL Cholesterol: 65 mg/dL (ref 0–99)
NonHDL: 83
Total CHOL/HDL Ratio: 3
Triglycerides: 90 mg/dL (ref 0.0–149.0)
VLDL: 18 mg/dL (ref 0.0–40.0)

## 2014-01-12 LAB — BASIC METABOLIC PANEL
BUN: 13 mg/dL (ref 6–23)
CO2: 29 mEq/L (ref 19–32)
Calcium: 9.4 mg/dL (ref 8.4–10.5)
Chloride: 104 mEq/L (ref 96–112)
Creatinine, Ser: 1.3 mg/dL (ref 0.4–1.5)
GFR: 69.96 mL/min (ref >60.00)
Glucose, Bld: 96 mg/dL (ref 70–99)
Potassium: 3.9 mEq/L (ref 3.5–5.1)
Sodium: 139 mEq/L (ref 135–145)

## 2014-01-12 NOTE — Addendum Note (Signed)
Addended by: Regis BillPRATT, Nataliee Shurtz B on: 01/12/2014 09:12 AM   Modules accepted: Orders

## 2014-01-12 NOTE — Patient Instructions (Signed)
Will obtain labs today and call you with the results (lp/bmet/hfp)  Your physician recommends that you continue on your current medications as directed. Please refer to the Current Medication list given to you today.  Your physician wants you to follow-up in: 6 months with fasting labs (lp/bmet/hfp) and EKG  You will receive a reminder letter in the mail two months in advance. If you don't receive a letter, please call our office to schedule the follow-up appointment.

## 2014-01-12 NOTE — Assessment & Plan Note (Signed)
The patient has had no recurrent chest pain or angina 

## 2014-01-12 NOTE — Progress Notes (Signed)
Gilbert DivineWilliam E Top Date of Birth:  [birth year] [facility] [address] [place], [place]  [address] [phone]  Fax   [phone]  HPI: This pleasant 68 year old gentleman is seen for a scheduled four-month followup office visit. He has a past history of ischemic heart disease. He underwent coronary artery bypass graft surgery in 2007. He had a previous remote inferior wall myocardial infarction. His last stress test was in 2009 and was negative. The patient has a history of high blood pressure and hypercholesterolemia. He also has psychiatric problems and is followed by  his psychiatrist about every 4 months.  The patient now has a new psychiatrist Dr.Mengtsu.  Previous psychiatrist retired  Since last visit the patient has been doing well. He walks at least twice a week in his neighborhood for exercise.  He also goes to the [facility] 3 times a week and works out.  He lives in a group home called [facility] and he has lived there for a total of 20 years. Psychiatric diagnosis is anxiety and depression according to the patient.  Since last visit he has had no new cardiac symptoms.  The patient now has a PCP. He recently had colonoscopy with removal of 6 polyps by Dr. Elnoria HowardHung. He also had an MRI of the abdomen and ultrasound of the abdomen both of which were unrevealing according to the patient. He had had some previous left lower quadrant discomfort which has now resolved without specific therapy.   Current Outpatient Prescriptions  Medication Sig Dispense Refill  . aspirin 81 MG tablet Take 81 mg by mouth daily.      Marland Kitchen. atorvastatin (LIPITOR) 20 MG tablet Take 1 tablet (20 mg total) by mouth daily.  90 tablet  3  . busPIRone (BUSPAR) 5 MG tablet Take 5 mg by mouth 2 (two) times daily.      Marland Kitchen. FLUARIX QUADRIVALENT 0.5 ML injection       . losartan (COZAAR) 100 MG tablet Take 1 tablet (100 mg total) by mouth daily.  90 tablet  3  . metoprolol tartrate (LOPRESSOR) 25 MG  tablet Take 1 tablet (25 mg total) by mouth 2 (two) times daily.  180 tablet  3  . Multiple Vitamin (MULTIVITAMIN) tablet Take 1 tablet by mouth daily.        . nitroGLYCERIN (NITROSTAT) 0.4 MG SL tablet ONE TABLET UNDER TONGUE AS NEEDED FOR CHEST PAIN EVERY 5 MINUTES  25 tablet  2  . thioridazine (MELLARIL) 25 MG tablet Take 25 mg by mouth 2 (two) times daily.        . Cholecalciferol (VITAMIN D) 2000 UNITS CAPS Take 1 capsule (2,000 Units total) by mouth daily.  30 capsule  11   No current facility-administered medications for this visit.    Allergies  Allergen Reactions  . Haloperidol Decanoate     Patient Active Problem List   Diagnosis Date Noted  . CAD 08/13/2008    Priority: High  . HYPERCHOLESTEROLEMIA 08/13/2008    Priority: Medium  . Vitamin D insufficiency 07/10/2013  . Need for Tdap vaccination 07/10/2013  . Thrombocytopenia 07/10/2013  . HTN (hypertension) 06/09/2013  . Annual physical exam 06/09/2013  . Hyperglycemia 06/09/2013  . 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  . HYPERTHYROIDISM 08/13/2008  . UNSPECIFIED ESSENTIAL HYPERTENSION 08/13/2008    History  Smoking status  . Former Smoker  . Types: Cigarettes  Smokeless tobacco  . Not on file    History  Alcohol Use No    Family History  Problem Relation Age of Onset  . Heart disease Mother   . Heart disease Father   . Heart failure Maternal Aunt     Review of Systems: The patient denies any heat or cold intolerance.  No weight gain or weight loss.  The patient denies headaches or blurry vision.  There is no cough or sputum production.  The patient denies dizziness.  There is no hematuria or hematochezia.  The patient denies any muscle aches or arthritis.  The patient denies any rash.  The patient denies frequent falling or instability.  There is no history of depression or anxiety.  All other systems were reviewed and are negative.   Physical Exam: Filed Vitals:    01/12/14 0807  BP: 118/76  Pulse: 64   The patient appears to be in no distress.  Head and neck exam reveals that the pupils are equal and reactive.  The extraocular movements are full.  There is no scleral icterus.  Mouth and pharynx are benign.  No lymphadenopathy.  No carotid bruits.  The jugular venous pressure is normal.  Thyroid is not enlarged or tender.  Chest is clear to percussion and auscultation.  No rales or rhonchi.  Expansion of the chest is symmetrical.  Heart reveals no abnormal lift or heave.  First and second heart sounds are normal.  There is no murmur gallop rub or click.  The abdomen is soft and nontender.  Bowel sounds are normoactive.  There is no hepatosplenomegaly or mass.  There are no abdominal bruits.  Extremities reveal no phlebitis or edema.  Pedal pulses are good.  There is no cyanosis or clubbing.  Neurologic exam is normal strength and no lateralizing weakness.  No sensory deficits.  Integument reveals no rash    Assessment / Plan: 1.  Hypercholesterolemia 2. ischemic heart disease with remote inferior wall myocardial infarction and CABG in 2007 3. essential hypertension  Disposition: Continue same medication.  Recheck in 6 months for office visit EKG lipid panel hepatic function panel and basal metabolic panel.  His weight today is higher than last time and he will work hard on careful diet

## 2014-01-12 NOTE — Assessment & Plan Note (Signed)
No headaches or dizziness.  No symptoms of CHF.

## 2014-01-12 NOTE — Assessment & Plan Note (Signed)
The patient is on Lipitor.  He is not having any myalgias.  We're checking liver functions today.

## 2014-01-12 NOTE — Progress Notes (Signed)
Quick Note:  Please report to patient. The recent labs are stable. Continue same medication and careful diet. ______ 

## 2014-02-05 ENCOUNTER — Other Ambulatory Visit: Payer: Medicare Other

## 2014-02-05 DIAGNOSIS — I10 Essential (primary) hypertension: Secondary | ICD-10-CM

## 2014-02-05 LAB — COMPLETE METABOLIC PANEL WITH GFR
ALT: 29 U/L (ref 0–53)
AST: 26 U/L (ref 0–37)
Albumin: 4.2 g/dL (ref 3.5–5.2)
Alkaline Phosphatase: 59 U/L (ref 39–117)
BUN: 12 mg/dL (ref 6–23)
CO2: 27 mEq/L (ref 19–32)
Calcium: 9.5 mg/dL (ref 8.4–10.5)
Chloride: 103 mEq/L (ref 96–112)
Creat: 1.33 mg/dL (ref 0.50–1.35)
GFR, Est African American: 63 mL/min
GFR, Est Non African American: 55 mL/min — ABNORMAL LOW
Glucose, Bld: 96 mg/dL (ref 70–99)
Potassium: 4.4 mEq/L (ref 3.5–5.3)
Sodium: 138 mEq/L (ref 135–145)
Total Bilirubin: 0.6 mg/dL (ref 0.2–1.2)
Total Protein: 6.7 g/dL (ref 6.0–8.3)

## 2014-02-12 ENCOUNTER — Ambulatory Visit (INDEPENDENT_AMBULATORY_CARE_PROVIDER_SITE_OTHER): Payer: Medicare Other | Admitting: Internal Medicine

## 2014-02-12 VITALS — BP 103/73 | HR 67 | Temp 98.0°F | Resp 16 | Ht 72.0 in | Wt 195.0 lb

## 2014-02-12 DIAGNOSIS — Z23 Encounter for immunization: Secondary | ICD-10-CM

## 2014-02-12 DIAGNOSIS — I10 Essential (primary) hypertension: Secondary | ICD-10-CM

## 2014-02-12 DIAGNOSIS — R42 Dizziness and giddiness: Secondary | ICD-10-CM

## 2014-02-12 DIAGNOSIS — N182 Chronic kidney disease, stage 2 (mild): Secondary | ICD-10-CM

## 2014-02-13 LAB — URINALYSIS
Bilirubin Urine: NEGATIVE
Glucose, UA: NEGATIVE mg/dL
Hgb urine dipstick: NEGATIVE
Ketones, ur: NEGATIVE mg/dL
Leukocytes, UA: NEGATIVE
Nitrite: NEGATIVE
Protein, ur: NEGATIVE mg/dL
Specific Gravity, Urine: 1.017 (ref 1.005–1.030)
Urobilinogen, UA: 0.2 mg/dL (ref 0.0–1.0)
pH: 6 (ref 5.0–8.0)

## 2014-02-13 LAB — MICROALBUMIN / CREATININE URINE RATIO
Creatinine, Urine: 111 mg/dL
Microalb Creat Ratio: 1.8 mg/g (ref 0.0–30.0)
Microalb, Ur: 0.2 mg/dL (ref <2.0)

## 2014-02-23 NOTE — Progress Notes (Signed)
Patient ID: Gilbert Harris, male   DOB: 1945/10/29, 68 y.o.   MRN: [id number]   Waller Marcussen, is a 68 y.o. male  NWG:[id number]  [id number]  DOB - [birth year]  CC:  Chief Complaint  Patient presents with  . Follow-up       HPI: Gilbert Harris is a 68 y.o. male here today for follow up on his chronic conditions. He is doing well and has continued to go to the [employer] where he interacts with members of his Peers.   He has continued to receive care through [facility] for his diagnosis of Schizophrenia. He is continued on Thioridazine and Buspar and is doing well.  He is followed by Dr. Patty Sermons for his cardiac conditions since age 54 (Pt had an MI at age 22). His BP is well controlled however he reports that he has occasionally had some dizziness upon standing. It is not present at this time and patient is unable to give a good description of the dizziness. He denies any associated symptoms of weakness or focal neurological deficits. He otherwise has no complaints.  Patient has No headache, No chest pain, No abdominal pain - No Nausea, No new weakness tingling or numbness, No Cough - SOB.  Allergies  Allergen Reactions  . Haloperidol Decanoate    Past Medical History  Diagnosis Date  . Coronary artery disease     CABG x4 LIMA to LAD, SV graft to RCA, sequential saphenous vein graft to diagnal and OM1  . HTN (hypertension)   . Hyperlipidemia   . Schizophrenia   . MI (myocardial infarction)    Current Outpatient Prescriptions on File Prior to Visit  Medication Sig Dispense Refill  . aspirin 81 MG tablet Take 81 mg by mouth daily.    Marland Kitchen atorvastatin (LIPITOR) 20 MG tablet Take 1 tablet (20 mg total) by mouth daily. 90 tablet 3  . busPIRone (BUSPAR) 5 MG tablet Take 5 mg by mouth 2 (two) times daily.    . Cholecalciferol (VITAMIN D) 2000 UNITS CAPS Take 1 capsule (2,000 Units total) by mouth daily. 30 capsule 11  . FLUARIX QUADRIVALENT 0.5 ML injection      . losartan (COZAAR) 100 MG tablet Take 1 tablet (100 mg total) by mouth daily. 90 tablet 3  . metoprolol tartrate (LOPRESSOR) 25 MG tablet Take 1 tablet (25 mg total) by mouth 2 (two) times daily. 180 tablet 3  . Multiple Vitamin (MULTIVITAMIN) tablet Take 1 tablet by mouth daily.      . nitroGLYCERIN (NITROSTAT) 0.4 MG SL tablet ONE TABLET UNDER TONGUE AS NEEDED FOR CHEST PAIN EVERY 5 MINUTES 25 tablet 2  . thioridazine (MELLARIL) 25 MG tablet Take 25 mg by mouth 2 (two) times daily.       No current facility-administered medications on file prior to visit.   Family History  Problem Relation Age of Onset  . Heart disease Mother   . Heart disease Father   . Heart failure Maternal Aunt    History   Social History  . Marital Status: Single    Spouse Name: N/A    Number of Children: N/A  . Years of Education: N/A   Occupational History  . Not on file.   Social History Main Topics  . Smoking status: Former Smoker    Types: Cigarettes  . Smokeless tobacco: Not on file  . Alcohol Use: No  . Drug Use: No  . Sexual Activity: No   Other Topics Concern  .  Not on file   Social History Narrative  . No narrative on file    Review of Systems: Constitutional: Negative for fever, chills, diaphoresis, activity change, appetite change and fatigue. HENT: Negative for ear pain, nosebleeds, congestion, facial swelling, rhinorrhea, neck pain, neck stiffness and ear discharge.  Eyes: Negative for pain, discharge, redness, itching and visual disturbance. Respiratory: Negative for cough, choking, chest tightness, shortness of breath, wheezing and stridor.  Cardiovascular: Negative for chest pain, palpitations and leg swelling. Gastrointestinal: Negative for abdominal distention. Genitourinary: Negative for dysuria, urgency, frequency, hematuria, flank pain, decreased urine volume, difficulty urinating and dyspareunia.  Musculoskeletal: Negative for back pain, joint swelling, arthralgia and  gait problem. Neurological: Negative for dizziness, tremors, seizures, syncope, facial asymmetry, speech difficulty, weakness, light-headedness, numbness and headaches.  Hematological: Negative for adenopathy. Does not bruise/bleed easily. Psychiatric/Behavioral: Negative for hallucinations, behavioral problems, confusion, dysphoric mood, decreased concentration and agitation.    Objective:    Filed Vitals:   02/12/14 1100  BP: 103/73  Pulse: 67  Temp: 98 F (36.7 C)  Resp: 16    Physical Exam: Constitutional: Patient appears well-developed and well-nourished. No distress. HENT: Normocephalic, atraumatic, External right and left ear normal. Oropharynx is clear and moist.  Eyes: Conjunctivae and EOM are normal. PERRLA, no scleral icterus. Neck: Normal ROM. Neck supple. No JVD. No tracheal deviation. No thyromegaly. CVS: RRR, S1/S2 +, no murmurs, no gallops, no carotid bruit.  Pulmonary: Effort and breath sounds normal, no stridor, rhonchi, wheezes, rales.  Abdominal: Soft. BS +, no distension, tenderness, rebound or guarding.  Musculoskeletal: Normal range of motion. No edema and no tenderness.  Lymphadenopathy: No lymphadenopathy noted, cervical, inguinal or axillary Neuro: Alert. Normal reflexes, muscle tone coordination. No cranial nerve deficit. Skin: Skin is warm and dry. No rash noted. Not diaphoretic. No erythema. No pallor. Psychiatric: Normal mood and affect. Behavior, judgment, thought content normal.  Lab Results  Component Value Date   WBC 5.5 01/12/2014   HGB 14.7 01/12/2014   HCT 42.0 01/12/2014   MCV 91.5 01/12/2014   PLT 153 01/12/2014   Lab Results  Component Value Date   CREATININE 1.33 02/05/2014   BUN 12 02/05/2014   NA 138 02/05/2014   K 4.4 02/05/2014   CL 103 02/05/2014   CO2 27 02/05/2014    Lab Results  Component Value Date   HGBA1C 5.6 06/05/2013   Lipid Panel     Component Value Date/Time   CHOL 126 01/12/2014 0826   TRIG 90.0  01/12/2014 0826   HDL 43.00 01/12/2014 0826   CHOLHDL 3 01/12/2014 0826   VLDL 18.0 01/12/2014 0826   LDLCALC 65 01/12/2014 0826       Assessment and plan:   1. Immunization due - Varicella-zoster vaccine subcutaneous - Pneumococcal polysaccharide vaccine 23-valent greater than or equal to 2yo subcutaneous/IM  2. Dizziness, nonspecific - This appears to be self limited and non-specific.  - Orthostatic vital signs (Negative)   3. Chronic kidney disease (CKD) stage G2/A3, mildly decreased glomerular filtration rate (GFR) between 60-89 mL/min/1.73 square meter and albuminuria creatinine ratio greater than 300 mg/g - Microalbumin/Creatinine Ratio, Urine - Urinalysis  4. Essential hypertension - BP well controlled. Managed by Dr. Patty SermonsBrackbill for more than 20 years.   Return in about 5 months (around 07/14/2014) for Annual Physical.  The patient was given clear instructions to go to ER or return to medical center if symptoms don't improve, worsen or new problems develop. The patient verbalized understanding. The patient was told  to call to get lab results if they haven't heard anything in the next week.     This note has been created with Education officer, environmentalDragon speech recognition software and smart phrase technology. Any transcriptional errors are unintentional.    MATTHEWS,MICHELLE A., MD [facility] [place], [place] [phone]   02/23/2014, 10:15 AM

## 2014-03-10 ENCOUNTER — Encounter: Payer: Self-pay | Admitting: Cardiology

## 2014-07-15 ENCOUNTER — Encounter: Payer: Self-pay | Admitting: Cardiology

## 2014-07-15 ENCOUNTER — Ambulatory Visit (INDEPENDENT_AMBULATORY_CARE_PROVIDER_SITE_OTHER): Payer: Medicare Other | Admitting: Cardiology

## 2014-07-15 VITALS — BP 118/90 | HR 52 | Ht 72.0 in | Wt 199.1 lb

## 2014-07-15 DIAGNOSIS — E7800 Pure hypercholesterolemia, unspecified: Secondary | ICD-10-CM

## 2014-07-15 DIAGNOSIS — I10 Essential (primary) hypertension: Secondary | ICD-10-CM | POA: Diagnosis not present

## 2014-07-15 DIAGNOSIS — I259 Chronic ischemic heart disease, unspecified: Secondary | ICD-10-CM | POA: Diagnosis not present

## 2014-07-15 DIAGNOSIS — I119 Hypertensive heart disease without heart failure: Secondary | ICD-10-CM

## 2014-07-15 LAB — HEPATIC FUNCTION PANEL
ALT: 37 U/L (ref 0–53)
AST: 36 U/L (ref 0–37)
Albumin: 4.2 g/dL (ref 3.5–5.2)
Alkaline Phosphatase: 63 U/L (ref 39–117)
Bilirubin, Direct: 0.2 mg/dL (ref 0.0–0.3)
Total Bilirubin: 0.5 mg/dL (ref 0.2–1.2)
Total Protein: 7.1 g/dL (ref 6.0–8.3)

## 2014-07-15 LAB — LIPID PANEL
Cholesterol: 111 mg/dL (ref 0–200)
HDL: 49.8 mg/dL (ref >39.00)
LDL Cholesterol: 49 mg/dL (ref 0–99)
NonHDL: 61.2
Total CHOL/HDL Ratio: 2
Triglycerides: 63 mg/dL (ref 0.0–149.0)
VLDL: 12.6 mg/dL (ref 0.0–40.0)

## 2014-07-15 LAB — BASIC METABOLIC PANEL
BUN: 12 mg/dL (ref 6–23)
CO2: 29 mEq/L (ref 19–32)
Calcium: 9.7 mg/dL (ref 8.4–10.5)
Chloride: 104 mEq/L (ref 96–112)
Creatinine, Ser: 1.45 mg/dL (ref 0.40–1.50)
GFR: 62.13 mL/min (ref >60.00)
Glucose, Bld: 95 mg/dL (ref 70–99)
Potassium: 4.3 mEq/L (ref 3.5–5.1)
Sodium: 136 mEq/L (ref 135–145)

## 2014-07-15 NOTE — Progress Notes (Signed)
Quick Note:  Please report to patient. The recent labs are stable. Continue same medication and careful diet. ______ 

## 2014-07-15 NOTE — Progress Notes (Signed)
Cardiology Office Note   Date:  07/15/2014   ID:  Gilbert Harris, Gilbert Harris 1945-05-08, MRN [id number]  PCP:  Altha Harm., MD  Cardiologist:   Cassell Clement, MD   No chief complaint on file.     History of Present Illness: Gilbert Harris is a 69 y.o. male who presents for scheduled follow-up 6 month visit  This pleasant 69 year old gentleman is seen for a scheduled  followup office visit. He has a past history of ischemic heart disease. He underwent coronary artery bypass graft surgery in 2007. He had a previous remote inferior wall myocardial infarction. His last stress test was in 2009 and was negative. The patient has a history of high blood pressure and hypercholesterolemia. He also has psychiatric problems and is followed by his psychiatrist about every 4 months. The patient now has a new psychiatrist Dr.Mengtsu. Previous psychiatrist retired  Since last visit the patient has been doing well. He walks at least twice a week in his neighborhood for exercise. He also goes to the [facility] 3 times a week and works out. He lives in a group home called [facility] and he has lived there for a total of 20 years. Psychiatric diagnosis is anxiety and depression according to the patient.  Since last visit he has had no new cardiac symptoms. Denies chest pain shortness of breath palpitations dizziness or syncope. The patient now has a PCP. He recently had colonoscopy with removal of 6 polyps by Dr. Elnoria Howard. He also had an MRI of the abdomen and ultrasound of the abdomen both of which were unrevealing according to the patient.  He had a difficult time with the MRI because of claustrophobia. The patient is a Landscape architect by profession.  He still enjoys doing drafting as a hobby.  He has had more difficulty getting draftsman supplies because several of his previous sources have closed down.  Past Medical History  Diagnosis Date  . Coronary artery disease     CABG x4 LIMA to LAD, SV  graft to RCA, sequential saphenous vein graft to diagnal and OM1  . HTN (hypertension)   . Hyperlipidemia   . Schizophrenia   . MI (myocardial infarction)     Past Surgical History  Procedure Laterality Date  . Cardiac catheterization      EF 50%--severe two vessel obstructive atherosclerotic coronary artery disease--Low normal left ventricle funcrtion  . Coronary artery bypass graft  2007     Current Outpatient Prescriptions  Medication Sig Dispense Refill  . aspirin 81 MG tablet Take 81 mg by mouth daily.    Marland Kitchen atorvastatin (LIPITOR) 20 MG tablet Take 1 tablet (20 mg total) by mouth daily. 90 tablet 3  . busPIRone (BUSPAR) 5 MG tablet Take 5 mg by mouth 2 (two) times daily.    . Cholecalciferol (VITAMIN D) 2000 UNITS CAPS Take 1 capsule (2,000 Units total) by mouth daily. 30 capsule 11  . FLUARIX QUADRIVALENT 0.5 ML injection     . losartan (COZAAR) 100 MG tablet Take 1 tablet (100 mg total) by mouth daily. 90 tablet 3  . metoprolol tartrate (LOPRESSOR) 25 MG tablet Take 1 tablet (25 mg total) by mouth 2 (two) times daily. 180 tablet 3  . Multiple Vitamin (MULTIVITAMIN) tablet Take 1 tablet by mouth daily.      . nitroGLYCERIN (NITROSTAT) 0.4 MG SL tablet ONE TABLET UNDER TONGUE AS NEEDED FOR CHEST PAIN EVERY 5 MINUTES 25 tablet 2  . thioridazine (MELLARIL) 25 MG tablet  Take 25 mg by mouth 2 (two) times daily.       No current facility-administered medications for this visit.    Allergies:   Haloperidol decanoate    Social History:  The patient  reports that he has quit smoking. His smoking use included Cigarettes. He does not have any smokeless tobacco history on file. He reports that he does not drink alcohol or use illicit drugs.   Family History:  The patient's family history includes Heart disease in his father and mother; Heart failure in his maternal aunt.    ROS:  Please see the history of present illness.   Otherwise, review of systems are positive for none.   All  other systems are reviewed and negative.    PHYSICAL EXAM: VS:  BP 118/90 mmHg  Pulse 52  Ht 6' (1.829 m)  Wt 199 lb 1.9 oz (90.32 kg)  BMI 27.00 kg/m2 , BMI Body mass index is 27 kg/(m^2). GEN: Well nourished, well developed, in no acute distress HEENT: normal Neck: no JVD, carotid bruits, or masses Cardiac: RRR; no murmurs, rubs, or gallops,no edema  Respiratory:  clear to auscultation bilaterally, normal work of breathing GI: soft, nontender, nondistended, + BS MS: no deformity or atrophy Skin: warm and dry, no rash Neuro:  Strength and sensation are intact Psych: euthymic mood, full affect   EKG:  EKG is ordered today. The ekg ordered today demonstrates sinus bradycardia and nonspecific T-wave abnormality and since previous tracing of 07/15/13, no significant change.   Recent Labs: 01/12/2014: Hemoglobin 14.7; Platelets 153 02/05/2014: ALT 29; BUN 12; Creatinine 1.33; Potassium 4.4; Sodium 138    Lipid Panel    Component Value Date/Time   CHOL 126 01/12/2014 0826   TRIG 90.0 01/12/2014 0826   HDL 43.00 01/12/2014 0826   CHOLHDL 3 01/12/2014 0826   VLDL 18.0 01/12/2014 0826   LDLCALC 65 01/12/2014 0826      Wt Readings from Last 3 Encounters:  07/15/14 199 lb 1.9 oz (90.32 kg)  02/12/14 195 lb (88.451 kg)  01/12/14 205 lb (92.987 kg)        ASSESSMENT AND PLAN:  1. Hypercholesterolemia 2. ischemic heart disease with remote inferior wall myocardial infarction and CABG in 2007 3. essential hypertension 4.  Former smoker.  He quit smoking when he had his bypass surgery in 2007. 5.  Chronic anxiety and depression.  He has lived successfully in a group home for more than 20 years.  Disposition: Continue same medication. Recheck in 6 months for office visit EKG lipid panel hepatic function panel and basal metabolic panel.   Current medicines are reviewed at length with the patient today.  The patient does not have concerns regarding medicines.  The  following changes have been made:  no change  Labs/ tests ordered today include:   Orders Placed This Encounter  Procedures  . Lipid panel  . Hepatic function panel  . Basic metabolic panel  . EKG 12-Lead     Position: Continue on current medication.  Recheck in 6 months for office visit lipid panel hepatic function panel and basal metabolic panel  Signed, Cassell Clementhomas Jailyne Chieffo, MD  07/15/2014 11:37 AM    [facility] [address], [place], [place]  [address] Phone: [phone]; Fax: [phone]

## 2014-07-15 NOTE — Patient Instructions (Signed)
Medication Instructions:  Your physician recommends that you continue on your current medications as directed. Please refer to the Current Medication list given to you today.  Labwork: LP/BMET/HFP  Testing/Procedures: NONE  Follow-Up: Your physician wants you to follow-up in: 6 months with fasting labs (lp/bmet/hfp)  You will receive a reminder letter in the mail two months in advance. If you don't receive a letter, please call our office to schedule the follow-up appointment.    

## 2014-07-16 ENCOUNTER — Encounter: Payer: Self-pay | Admitting: Internal Medicine

## 2014-07-16 ENCOUNTER — Ambulatory Visit (INDEPENDENT_AMBULATORY_CARE_PROVIDER_SITE_OTHER): Payer: Medicare Other | Admitting: Internal Medicine

## 2014-07-16 VITALS — BP 112/74 | HR 64 | Temp 97.6°F | Resp 16 | Ht 72.0 in | Wt 200.0 lb

## 2014-07-16 DIAGNOSIS — I10 Essential (primary) hypertension: Secondary | ICD-10-CM

## 2014-07-16 DIAGNOSIS — I2510 Atherosclerotic heart disease of native coronary artery without angina pectoris: Secondary | ICD-10-CM

## 2014-07-16 DIAGNOSIS — E7800 Pure hypercholesterolemia, unspecified: Secondary | ICD-10-CM

## 2014-07-16 DIAGNOSIS — Z0000 Encounter for general adult medical examination without abnormal findings: Secondary | ICD-10-CM

## 2014-07-16 DIAGNOSIS — I119 Hypertensive heart disease without heart failure: Secondary | ICD-10-CM

## 2014-07-16 DIAGNOSIS — R739 Hyperglycemia, unspecified: Secondary | ICD-10-CM

## 2014-07-16 DIAGNOSIS — D696 Thrombocytopenia, unspecified: Secondary | ICD-10-CM | POA: Diagnosis not present

## 2014-07-16 LAB — CBC WITH DIFFERENTIAL/PLATELET
Basophils Absolute: 0.1 10*3/uL (ref 0.0–0.1)
Basophils Relative: 1 % (ref 0–1)
Eosinophils Absolute: 0.2 10*3/uL (ref 0.0–0.7)
Eosinophils Relative: 3 % (ref 0–5)
HCT: 43.3 % (ref 39.0–52.0)
Hemoglobin: 14.9 g/dL (ref 13.0–17.0)
Lymphocytes Relative: 34 % (ref 12–46)
Lymphs Abs: 2.1 10*3/uL (ref 0.7–4.0)
MCH: 31.7 pg (ref 26.0–34.0)
MCHC: 34.4 g/dL (ref 30.0–36.0)
MCV: 92.1 fL (ref 78.0–100.0)
MPV: 11.2 fL (ref 8.6–12.4)
Monocytes Absolute: 0.6 10*3/uL (ref 0.1–1.0)
Monocytes Relative: 9 % (ref 3–12)
Neutro Abs: 3.3 10*3/uL (ref 1.7–7.7)
Neutrophils Relative %: 53 % (ref 43–77)
Platelets: 158 10*3/uL (ref 150–400)
RBC: 4.7 MIL/uL (ref 4.22–5.81)
RDW: 13.1 % (ref 11.5–15.5)
WBC: 6.3 10*3/uL (ref 4.0–10.5)

## 2014-07-16 LAB — HEMOGLOBIN A1C
Hgb A1c MFr Bld: 5.8 % — ABNORMAL HIGH (ref <5.7)
Mean Plasma Glucose: 120 mg/dL — ABNORMAL HIGH (ref <117)

## 2014-07-16 NOTE — Progress Notes (Signed)
Patient ID: BROOKES CRAINE, male   DOB: July 13, 1945, 69 y.o.   MRN: [id number]  ANNUAL PREVENTATIVE VISIT AND CPE  Subjective:  AZAREL BANNER is a 69 y.o. male who presents for Medicare Annual Wellness Visit and complete physical.  Date of last medicare wellness visit is unknown.  He has had elevated blood pressure for several years. However BP controlled with medications His blood pressure has been controlled at home, today their BP is BP: 95/71 mmHg He does workout. He denies chest pain, shortness of breath, dizziness.  He is on cholesterol medication and denies myalgias. His cholesterol is at goal. The cholesterol last visit was:   Lab Results  Component Value Date   CHOL 111 07/15/2014   HDL 49.80 07/15/2014   LDLCALC 49 07/15/2014   TRIG 63.0 07/15/2014   CHOLHDL 2 07/15/2014   He does not have diabetes. He has been working on diet and exercise for prediabetes, and denies hyperglycemia, hypoglycemia , increased appetite, nausea, paresthesia of the feet, polydipsia, polyuria, vomiting and weight loss. Last A1C in the office was:  Lab Results  Component Value Date   HGBA1C 5.6 06/05/2013   Patient is on Vitamin D supplement.   Lab Results  Component Value Date   VD25OH 44 06/05/2013       Names of Other Physician/Practitioners you currently use: 1. [facility] here for primary care 2. Dr. Burgess Estelle, eye doctor, last visit 05/2014 3. Dr. Hillis Range, dentist, last visit 1 year ago  Patient Care Team: Altha Harm, MD as PCP - General (Internal Medicine) Cassell Clement, MD as Consulting Physician (Cardiology)   Medication Review: Current Outpatient Prescriptions on File Prior to Visit  Medication Sig Dispense Refill  . aspirin 81 MG tablet Take 81 mg by mouth daily.    Marland Kitchen atorvastatin (LIPITOR) 20 MG tablet Take 1 tablet (20 mg total) by mouth daily. 90 tablet 3  . busPIRone (BUSPAR) 5 MG tablet Take 5 mg by mouth 2 (two) times daily.    Marland Kitchen FLUARIX  QUADRIVALENT 0.5 ML injection     . losartan (COZAAR) 100 MG tablet Take 1 tablet (100 mg total) by mouth daily. 90 tablet 3  . metoprolol tartrate (LOPRESSOR) 25 MG tablet Take 1 tablet (25 mg total) by mouth 2 (two) times daily. 180 tablet 3  . Multiple Vitamin (MULTIVITAMIN) tablet Take 1 tablet by mouth daily.      . nitroGLYCERIN (NITROSTAT) 0.4 MG SL tablet ONE TABLET UNDER TONGUE AS NEEDED FOR CHEST PAIN EVERY 5 MINUTES 25 tablet 2  . thioridazine (MELLARIL) 25 MG tablet Take 25 mg by mouth 2 (two) times daily.      . Cholecalciferol (VITAMIN D) 2000 UNITS CAPS Take 1 capsule (2,000 Units total) by mouth daily. (Patient not taking: Reported on 07/16/2014) 30 capsule 11   No current facility-administered medications on file prior to visit.    Current Problems (verified) Patient Active Problem List   Diagnosis Date Noted  . Chronic kidney disease (CKD) stage G2/A3, mildly decreased glomerular filtration rate (GFR) between 60-89 mL/min/1.73 square meter and albuminuria creatinine ratio greater than 300 mg/g 02/12/2014  . Vitamin D insufficiency 07/10/2013  . Thrombocytopenia 07/10/2013  . HTN (hypertension) 06/09/2013  . Annual physical exam 06/09/2013  . Hyperglycemia 06/09/2013  . 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  . HYPERCHOLESTEROLEMIA 08/13/2008  . UNSPECIFIED ESSENTIAL HYPERTENSION 08/13/2008  . Coronary atherosclerosis 08/13/2008    Screening Tests Health Maintenance  Topic Date  Due  . INFLUENZA VACCINE  10/26/2014  . COLONOSCOPY  07/09/2023  . TETANUS/TDAP  07/11/2023  . ZOSTAVAX  Completed    Immunization History  Administered Date(s) Administered  . Influenza Whole 12/22/2010  . Pneumococcal Polysaccharide-23 02/12/2014  . Tdap 07/10/2013  . Zoster 02/12/2014    Preventative care: Last colonoscopy: 07/08/2013 Last mammogram: N/A Last pap smear/pelvic exam: N/A   DEXA:Not indicated until age 48  Prior  vaccinations: Prevnar13: Due 01/29/2015     Medication List       This list is accurate as of: 07/16/14  9:27 AM.  Always use your most recent med list.               aspirin 81 MG tablet  Take 81 mg by mouth daily.     atorvastatin 20 MG tablet  Commonly known as:  LIPITOR  Take 1 tablet (20 mg total) by mouth daily.     busPIRone 5 MG tablet  Commonly known as:  BUSPAR  Take 5 mg by mouth 2 (two) times daily.     FLUARIX QUADRIVALENT 0.5 ML injection  Generic drug:  Influenza vac split quadrivalent PF     losartan 100 MG tablet  Commonly known as:  COZAAR  Take 1 tablet (100 mg total) by mouth daily.     metoprolol tartrate 25 MG tablet  Commonly known as:  LOPRESSOR  Take 1 tablet (25 mg total) by mouth 2 (two) times daily.     multivitamin tablet  Take 1 tablet by mouth daily.     nitroGLYCERIN 0.4 MG SL tablet  Commonly known as:  NITROSTAT  ONE TABLET UNDER TONGUE AS NEEDED FOR CHEST PAIN EVERY 5 MINUTES     thioridazine 25 MG tablet  Commonly known as:  MELLARIL  Take 25 mg by mouth 2 (two) times daily.     Vitamin D 2000 UNITS Caps  Take 1 capsule (2,000 Units total) by mouth daily.        Past Surgical History  Procedure Laterality Date  . Cardiac catheterization      EF 50%--severe two vessel obstructive atherosclerotic coronary artery disease--Low normal left ventricle funcrtion  . Coronary artery bypass graft  2007  . Hernia repair    . Tonsillectomy and adenoidectomy Bilateral y-59   Family History  Problem Relation Age of Onset  . Heart disease Mother   . Heart disease Father   . Heart failure Maternal Aunt     History reviewed: allergies, current medications, past family history, past medical history, past social history, past surgical history and problem list   Risk Factors: Osteoporosis/FallRisk: None In the past year have you fallen or had a near fall?:No History of fracture in the past year: no  Tobacco History  Substance  Use Topics  . Smoking status: Former Smoker    Types: Cigarettes  . Smokeless tobacco: Not on file  . Alcohol Use: No   He does not smoke.  Patient is a former smoker. Are there smokers in your home (other than you)?  No  Alcohol Current alcohol use: none  Caffeine Current caffeine use: coffee 1 /week  Exercise Current exercise: aerobics, cardiovascular workout on exercise equipment, walking and Stretching and chair yoga  Nutrition/Diet Current diet: on average, 2 meals per day  Cardiac risk factors: advanced age (older than 48 for men, 35 for women), dyslipidemia and male gender.  Depression Screen (Note: if answer to either of the following is "Yes", a more complete depression  screening is indicated)   Q1: Over the past two weeks, have you felt down, depressed or hopeless? No  Q2: Over the past two weeks, have you felt little interest or pleasure in doing things? No  Have you lost interest or pleasure in daily life? No  Do you often feel hopeless? No  Do you cry easily over simple problems? No  Activities of Daily Living In your present state of health, do you have any difficulty performing the following activities?:  Driving? Pt does not drive Managing money?  No Feeding yourself? No Getting from bed to chair? No Climbing a flight of stairs? No Preparing food and eating?: No Bathing or showering? No Getting dressed: No Getting to the toilet? No Using the toilet:No Moving around from place to place: No In the past year have you fallen or had a near fall?:No   Are you sexually active?  No  Do you have more than one partner?  N/A  Vision Difficulties: No  Hearing Difficulties: No Do you often ask people to speak up or repeat themselves? No Do you experience ringing or noises in your ears? No Do you have difficulty understanding soft or whispered voices? No  Cognition  Do you feel that you have a problem with memory?No  Do you often misplace items? No  Do  you feel safe at home?  Yes  Advanced directives Does patient have a Health Care Power of Attorney? No Does patient have a Living Will? No   Objective:     Blood pressure 95/71, pulse 64, temperature 97.6 F (36.4 C), temperature source Oral, resp. rate 16, height 6' (1.829 m), weight 200 lb (90.719 kg). Body mass index is 27.12 kg/(m^2).  General appearance: alert, no distress, WD/WN, male Cognitive Testing  Alert? Yes  Normal Appearance?Yes  Oriented to person? Yes  Place? Yes   Time? Yes  Recall of three objects?  Yes  Can perform simple calculations? Yes  Displays appropriate judgment?Yes  Can read the correct time from a watch face?Yes  HEENT: normocephalic, sclerae anicteric, TMs pearly, nares patent, no discharge or erythema, pharynx normal Oral cavity: MMM, no lesions Neck: supple, no lymphadenopathy, no thyromegaly, no masses Heart: RRR, normal S1, S2, no murmurs Lungs: CTA bilaterally, no wheezes, rhonchi, or rales Abdomen: +bs, soft, non tender, non distended, no masses, no hepatomegaly, no splenomegaly Musculoskeletal: nontender, no swelling, no obvious deformity Extremities: no edema, no cyanosis, no clubbing Pulses: 2+ symmetric, upper and lower extremities, normal cap refill Neurological: alert, oriented x 3, CN2-12 intact, strength normal upper extremities and lower extremities, sensation normal throughout, DTRs 2+ throughout, no cerebellar signs, gait normal Psychiatric: normal affect, behavior normal, pleasant   Assessment:     Plan:  .1.  Annual physical exam - Patient denies any difficulties at home. No trouble with ADLs, depression or falls. No recent changes to vision or hearing. Is UTD with immunizations. Is UTD with screening. Discussed Advanced Directives, patient agrees to bring us copies of documents if can. Encouraged heart healthy diet, exercise as tolerated and adequate sleep. .  - CBC with Differential/Platelet - Hemoglobin A1C   2.  Thrombocytopenia - Pt has had isolated low platelet count but no associated bleeding. I feel that this is likely secondary to Mellarill use.  - CBC with Differential/Platelet  3. HYPERCHOLESTEROLEMIA - Pt currently on Atorvastatin 20 mg and lipids have been managed by Dr. Patty SermonsBrackbill for many years. Lipids at goal. - Hemoglobin A1C  4. Essential hypertension - BP  initially found to be marginally low but repeat BP is consistent with usual BP of 118/90. Continue current medications as prescribed. - Hemoglobin A1C - Microalbumin, urine  5. Atherosclerosis of native coronary artery of native heart without angina pectoris - Hemoglobin A1C   During the course of the visit the patient was educated and counseled about appropriate screening and preventive services including:    Pneumococcal vaccine   Influenza vaccine  Td vaccine  Screening electrocardiogram  Bone densitometry screening  Colorectal cancer screening  Diabetes screening  Glaucoma screening  Nutrition counseling   Advanced directives: requested  Screening recommendations, referrals: Vaccinations: Please see documentation below and orders this visit.  Nutrition assessed and recommended  Colonoscopy Due again in 2025 Recommended yearly ophthalmology/optometry visit for glaucoma screening and checkup Recommended yearly dental visit for hygiene and checkup Advanced directives - requested  Conditions/risks identified: BMI: Discussed weight loss, diet, and increase physical activity.  Increase physical activity: AHA recommends 150 minutes of physical activity a week.  Medications reviewed Urinary Incontinence is not an issue: discussed non pharmacology and pharmacology options.  Fall risk: low- discussed PT, home fall assessment, medications.    Medicare Attestation I have personally reviewed: The patient's medical and social history Their use of alcohol, tobacco or illicit drugs Their current medications  and supplements The patient's functional ability including ADLs,fall risks, home safety risks, cognitive, and hearing and visual impairment Diet and physical activities Evidence for depression or mood disorders  The patient's weight, height, BMI, and visual acuity have been recorded in the chart.  I have made referrals, counseling, and provided education to the patient based on review of the above and I have provided the patient with a written personalized care plan for preventive services.     MATTHEWS,MICHELLE A., MD   07/16/2014

## 2014-07-16 NOTE — Patient Instructions (Signed)
Fat and Cholesterol Control Diet Fat and cholesterol levels in your blood and organs are influenced by your diet. High levels of fat and cholesterol may lead to diseases of the heart, small and large blood vessels, gallbladder, liver, and pancreas. CONTROLLING FAT AND CHOLESTEROL WITH DIET Although exercise and lifestyle factors are important, your diet is key. That is because certain foods are known to raise cholesterol and others to lower it. The goal is to balance foods for their effect on cholesterol and more importantly, to replace saturated and trans fat with other types of fat, such as monounsaturated fat, polyunsaturated fat, and omega-3 fatty acids. On average, a person should consume no more than 15 to 17 g of saturated fat daily. Saturated and trans fats are considered "bad" fats, and they will raise LDL cholesterol. Saturated fats are primarily found in animal products such as meats, butter, and cream. However, that does not mean you need to give up all your favorite foods. Today, there are good tasting, low-fat, low-cholesterol substitutes for most of the things you like to eat. Choose low-fat or nonfat alternatives. Choose round or loin cuts of red meat. These types of cuts are lowest in fat and cholesterol. Chicken (without the skin), fish, veal, and ground turkey breast are great choices. Eliminate fatty meats, such as hot dogs and salami. Even shellfish have little or no saturated fat. Have a 3 oz (85 g) portion when you eat lean meat, poultry, or fish. Trans fats are also called "partially hydrogenated oils." They are oils that have been scientifically manipulated so that they are solid at room temperature resulting in a longer shelf life and improved taste and texture of foods in which they are added. Trans fats are found in stick margarine, some tub margarines, cookies, crackers, and baked goods.  When baking and cooking, oils are a great substitute for butter. The monounsaturated oils are  especially beneficial since it is believed they lower LDL and raise HDL. The oils you should avoid entirely are saturated tropical oils, such as coconut and palm.  Remember to eat a lot from food groups that are naturally free of saturated and trans fat, including fish, fruit, vegetables, beans, grains (barley, rice, couscous, bulgur wheat), and pasta (without cream sauces).  IDENTIFYING FOODS THAT LOWER FAT AND CHOLESTEROL  Soluble fiber may lower your cholesterol. This type of fiber is found in fruits such as apples, vegetables such as broccoli, potatoes, and carrots, legumes such as beans, peas, and lentils, and grains such as barley. Foods fortified with plant sterols (phytosterol) may also lower cholesterol. You should eat at least 2 g per day of these foods for a cholesterol lowering effect.  Read package labels to identify low-saturated fats, trans fat free, and low-fat foods at the supermarket. Select cheeses that have only 2 to 3 g saturated fat per ounce. Use a heart-healthy tub margarine that is free of trans fats or partially hydrogenated oil. When buying baked goods (cookies, crackers), avoid partially hydrogenated oils. Breads and muffins should be made from whole grains (whole-wheat or whole oat flour, instead of "flour" or "enriched flour"). Buy non-creamy canned soups with reduced salt and no added fats.  FOOD PREPARATION TECHNIQUES  Never deep-fry. If you must fry, either stir-fry, which uses very little fat, or use non-stick cooking sprays. When possible, broil, bake, or roast meats, and steam vegetables. Instead of putting butter or margarine on vegetables, use lemon and herbs, applesauce, and cinnamon (for squash and sweet potatoes). Use nonfat   yogurt, salsa, and low-fat dressings for salads.  LOW-SATURATED FAT / LOW-FAT FOOD SUBSTITUTES Meats / Saturated Fat (g)  Avoid: Steak, marbled (3 oz/85 g) / 11 g  Choose: Steak, lean (3 oz/85 g) / 4 g  Avoid: Hamburger (3 oz/85 g) / 7  g  Choose: Hamburger, lean (3 oz/85 g) / 5 g  Avoid: Ham (3 oz/85 g) / 6 g  Choose: Ham, lean cut (3 oz/85 g) / 2.4 g  Avoid: Chicken, with skin, dark meat (3 oz/85 g) / 4 g  Choose: Chicken, skin removed, dark meat (3 oz/85 g) / 2 g  Avoid: Chicken, with skin, light meat (3 oz/85 g) / 2.5 g  Choose: Chicken, skin removed, light meat (3 oz/85 g) / 1 g Dairy / Saturated Fat (g)  Avoid: Whole milk (1 cup) / 5 g  Choose: Low-fat milk, 2% (1 cup) / 3 g  Choose: Low-fat milk, 1% (1 cup) / 1.5 g  Choose: Skim milk (1 cup) / 0.3 g  Avoid: Hard cheese (1 oz/28 g) / 6 g  Choose: Skim milk cheese (1 oz/28 g) / 2 to 3 g  Avoid: Cottage cheese, 4% fat (1 cup) / 6.5 g  Choose: Low-fat cottage cheese, 1% fat (1 cup) / 1.5 g  Avoid: Ice cream (1 cup) / 9 g  Choose: Sherbet (1 cup) / 2.5 g  Choose: Nonfat frozen yogurt (1 cup) / 0.3 g  Choose: Frozen fruit bar / trace  Avoid: Whipped cream (1 tbs) / 3.5 g  Choose: Nondairy whipped topping (1 tbs) / 1 g Condiments / Saturated Fat (g)  Avoid: Mayonnaise (1 tbs) / 2 g  Choose: Low-fat mayonnaise (1 tbs) / 1 g  Avoid: Butter (1 tbs) / 7 g  Choose: Extra light margarine (1 tbs) / 1 g  Avoid: Coconut oil (1 tbs) / 11.8 g  Choose: Olive oil (1 tbs) / 1.8 g  Choose: Corn oil (1 tbs) / 1.7 g  Choose: Safflower oil (1 tbs) / 1.2 g  Choose: Sunflower oil (1 tbs) / 1.4 g  Choose: Soybean oil (1 tbs) / 2.4 g  Choose: Canola oil (1 tbs) / 1 g Document Released: 03/13/2005 Document Revised: 07/08/2012 Document Reviewed: 06/11/2013 [employer] Patient Information 2015 [employer]. This information is not intended to replace advice given to you by your health care provider. Make sure you discuss any questions you have with your health care provider.  

## 2014-07-17 LAB — MICROALBUMIN, URINE: Microalb, Ur: 1.4 mg/dL (ref <2.0)

## 2014-07-29 ENCOUNTER — Telehealth: Payer: Self-pay | Admitting: Cardiology

## 2014-07-29 NOTE — Telephone Encounter (Signed)
Walk in pt form-pt dropped off Verification of Disability Handicap papers to be completed- gave to MelbetaMelinda.

## 2014-08-10 ENCOUNTER — Telehealth: Payer: Self-pay | Admitting: Cardiology

## 2014-08-10 NOTE — Telephone Encounter (Signed)
Left Pt VM Handicap Form completed and ready for pick up.

## 2014-08-25 ENCOUNTER — Other Ambulatory Visit: Payer: Self-pay | Admitting: Cardiology

## 2014-08-26 ENCOUNTER — Ambulatory Visit (INDEPENDENT_AMBULATORY_CARE_PROVIDER_SITE_OTHER): Payer: Medicare Other | Admitting: Psychiatry

## 2014-08-26 ENCOUNTER — Encounter (INDEPENDENT_AMBULATORY_CARE_PROVIDER_SITE_OTHER): Payer: Self-pay

## 2014-08-26 ENCOUNTER — Encounter (HOSPITAL_COMMUNITY): Payer: Self-pay | Admitting: Psychiatry

## 2014-08-26 VITALS — BP 121/82 | HR 61 | Ht 72.0 in | Wt 199.0 lb

## 2014-08-26 DIAGNOSIS — F200 Paranoid schizophrenia: Secondary | ICD-10-CM

## 2014-08-26 MED ORDER — THIORIDAZINE HCL 25 MG PO TABS: 25.0000 mg | ORAL_TABLET | Freq: Two times a day (BID) | ORAL | Status: DC

## 2014-08-26 MED ORDER — BUSPIRONE HCL 5 MG PO TABS: 5.0000 mg | ORAL_TABLET | Freq: Two times a day (BID) | ORAL | Status: DC

## 2014-08-26 NOTE — Progress Notes (Signed)
Psychiatric Initial Adult Assessment   Patient Identification: Gilbert Harris MRN:  [id number] Date of Evaluation:  08/26/2014 Referral Source: Vesta Mixer Chief Complaint:  Need continual care Chief Complaint    Establish Care     Visit Diagnosis: No diagnosis found. Diagnosis:   Patient Active Problem List   Diagnosis Date Noted  . Chronic kidney disease (CKD) stage G2/A3, mildly decreased glomerular filtration rate (GFR) between 60-89 mL/min/1.73 square meter and albuminuria creatinine ratio greater than 300 mg/g [N18.2] 02/12/2014  . Thrombocytopenia [D69.6] 07/10/2013  . HTN (hypertension) [I10] 06/09/2013  . Annual physical exam [Z00.00] 06/09/2013  . Hyperglycemia [R73.9] 06/09/2013  . Benign hypertensive heart disease without heart failure [I11.9] 07/14/2011  . HYPERCHOLESTEROLEMIA [E78.0] 08/13/2008  . Essential hypertension [I10] 08/13/2008  . Coronary atherosclerosis [I25.10] 08/13/2008   History of Present Illness:  This patient is a 69 year old African-American divorced father carries a diagnosis of paranoid schizophrenia and lives at the [facility]. This patient is friendly and engaging and immaculately dressed. He's been divorced for over 20 years. He has 2 sons ages 69 to who lives in [place] and another son age 37 who lives in [place] and is married. The patient has no grandchildren. The patient presently is on disability. 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 The patient is very productive. He is very active functioning very independently and well. The patient grew up in [place] but moved to [place] for a number of years. He then came back to [place]. His biggest trauma occurred in his 16s when his brother was killed in a motor vehicle accident. The patient says she experienced months of being depressed but eventually got well. At one point however the patient did experience a psychotic break. The patient is been in 3 psychiatric  hospitals the last one being [facility] which was actually over 25 years ago. The patient is been on Mellaril 25 mg for well over a decade. At this time the patient denies daily depression. He is sleeping and eating well. He's got good energy can concentrate well and feels a good sense of worth. He enjoys the television reading a great deal and he draws and paints. He exercises at the [facility] on a regular basis. The patient denies being suicidal and has never made a suicide attempt. The patient denies the use of alcohol now or ever. He denies the use of drugs. 20 years ago he did experience auditory hallucinations and was somewhat paranoid requiring psychiatric care. The patient is been free of psychosis for over 20 years. The patient cannot remember. Where he was persistently depressed other than the time after his brother died for a few months. The patient denies any symptoms consistent with mania. He denies any symptoms of generalized anxiety disorder panic disorder or obsessive-compulsive disorder area The patient is been in 3 psychiatric hospitalizations [facility] in [place] and [facility] in [place]. The patient has been under the psychiatric care in the outpatient setting of [facility] and then a [facility] up until he was refused because of his insurance. The patient does not remember many of his other medications but the Mellaril 25 mg in the BuSpar 5 mg twice a day has worked well for him for well over a decade. The patient denies the use of cigarettes. He describes a stable childhood. He experienced no physical or sexual abuse. Presently the patient is not in a relationship. He feels  very well adjusted at the [facility] in [place] worries lived over 20 years. The patient does have a significant medical history including hypertension and hypercholesterolemia and coronary artery disease. He myocardial infarction a number of years ago  but had a CABG done in 2008. He's followed closely by his cardiologist Dr. Patty Sermons. The patient denies shortness of breath or chest pain. He denies any neurological symptoms at this time. Today reviewed his medications and discussed that he would enter into care with me in the setting and he agreed to do just that. Elements: Depression Symptoms:  None present (Hypo) Manic Symptoms:  nono Anxiety Symptoms:  no Psychotic Symptom none now but had altered hallucinations in the past. PTSD Symptoms: Negative  Past Medical History:  Past Medical History  Diagnosis Date  . Coronary artery disease     CABG x4 LIMA to LAD, SV graft to RCA, sequential saphenous vein graft to diagnal and OM1  . HTN (hypertension)   . Hyperlipidemia   . Schizophrenia   . MI (myocardial infarction)     Past Surgical History  Procedure Laterality Date  . Cardiac catheterization      EF 50%--severe two vessel obstructive atherosclerotic coronary artery disease--Low normal left ventricle funcrtion  . Coronary artery bypass graft  2007  . Hernia repair    . Tonsillectomy and adenoidectomy Bilateral y-59   Family History:  Family History  Problem Relation Age of Onset  . Heart disease Mother   . Heart disease Father   . Heart failure Maternal Aunt    Social History:   History   Social History  . Marital Status: Single    Spouse Name: N/A  . Number of Children: N/A  . Years of Education: N/A   Social History Main Topics  . Smoking status: Former Smoker    Types: Cigarettes  . Smokeless tobacco: Never Used  . Alcohol Use: No  . Drug Use: No  . Sexual Activity: No   Other Topics Concern  . None   Social History Narrative   Additional Social History:   Musculoskeletal: Strength & Muscle Tone:  Gait & Station:  Patient leans:   Psychiatric Specialty Exam: HPI  ROS  Blood pressure 121/82, pulse 61, height 6' (1.829 m), weight 199 lb (90.266 kg).Body mass index is 26.98 kg/(m^2).  General  Appearance: Meticulous  Eye Contact:  Good  Speech:  Normal Rate  Volume:  Normal  Mood:  Negative  Affect:  Appropriate  Thought Process:  Coherent  Orientation:  NA  Thought Content:  WDL  Suicidal Thoughts:  No  Homicidal Thoughts:  No  Memory:  NA  Judgement:  NA  Insight:  Good  Psychomotor Activity:  Normal  Concentration:  Good  Recall:  Good  Fund of Knowledge:Good  Language: Good  Akathisia:  No  Handed:  Right  AIMS (if indicated):  Completed and was negative   Assets:  Desire for Improvement  ADL's:  Intact  Cognition: WNL  Sleep:     Is the patient at risk to self?  No. Has the patient been a risk to self in the past 6 months?  No. Has the patient been a risk to self within the distant past?  No. Is the patient a risk to others?  No. Has the patient been a risk to others in the past 6 months?  No. Has the patient been a risk to others within the distant past?  No.  Allergies:   Allergies  Allergen  Reactions  . Haloperidol Decanoate    Current Medications: Current Outpatient Prescriptions  Medication Sig Dispense Refill  . aspirin 81 MG tablet Take 81 mg by mouth daily.    Marland Kitchen. atorvastatin (LIPITOR) 20 MG tablet Take 1 tablet by mouth  daily 90 tablet 1  . busPIRone (BUSPAR) 5 MG tablet Take 1 tablet (5 mg total) by mouth 2 (two) times daily. 60 tablet 6  . Cholecalciferol (VITAMIN D) 2000 UNITS CAPS Take 1 capsule (2,000 Units total) by mouth daily. (Patient not taking: Reported on 07/16/2014) 30 capsule 11  . FLUARIX QUADRIVALENT 0.5 ML injection     . losartan (COZAAR) 100 MG tablet Take 1 tablet by mouth  daily 90 tablet 1  . metoprolol tartrate (LOPRESSOR) 25 MG tablet Take 1 tablet by mouth two  times daily 180 tablet 1  . Multiple Vitamin (MULTIVITAMIN) tablet Take 1 tablet by mouth daily.      . nitroGLYCERIN (NITROSTAT) 0.4 MG SL tablet ONE TABLET UNDER TONGUE AS NEEDED FOR CHEST PAIN EVERY 5 MINUTES 25 tablet 2  . thioridazine (MELLARIL) 25 MG  tablet Take 1 tablet (25 mg total) by mouth 2 (two) times daily. 60 tablet 6   No current facility-administered medications for this visit.    Previous Psychotropic Medications presently is on Mellaril and BuSpar  Substance Abuse History in the last 12 months:  No.  Consequences of Substance Abuse:   Medical Decision Making:  Established Problem, Stable/Improving (1)  Treatment Plan Summary At this time the patient continue taking Mellaril 25 mg which is been on for over 10 years. He asked he takes Mellaril 25 mg 2 at night and he takes BuSpar 5 mg twice a day. The patient is doing very well at this time. The patient return to see me in 3 months.    Lucas MallowLOVSKY, Calem Cocozza IRVING 6/1/20163:07 PM

## 2014-09-10 ENCOUNTER — Emergency Department (HOSPITAL_COMMUNITY)
Admission: EM | Admit: 2014-09-10 | Discharge: 2014-09-10 | Disposition: A | Discharge status: Home / Self Care | Payer: Medicare Other | Attending: Emergency Medicine | Admitting: Emergency Medicine

## 2014-09-10 ENCOUNTER — Emergency Department (HOSPITAL_COMMUNITY): Payer: Medicare Other

## 2014-09-10 ENCOUNTER — Encounter (HOSPITAL_COMMUNITY): Payer: Self-pay | Admitting: Emergency Medicine

## 2014-09-10 DIAGNOSIS — Z87891 Personal history of nicotine dependence: Secondary | ICD-10-CM | POA: Diagnosis not present

## 2014-09-10 DIAGNOSIS — I2510 Atherosclerotic heart disease of native coronary artery without angina pectoris: Secondary | ICD-10-CM | POA: Insufficient documentation

## 2014-09-10 DIAGNOSIS — Z79899 Other long term (current) drug therapy: Secondary | ICD-10-CM | POA: Insufficient documentation

## 2014-09-10 DIAGNOSIS — Z9889 Other specified postprocedural states: Secondary | ICD-10-CM | POA: Insufficient documentation

## 2014-09-10 DIAGNOSIS — I10 Essential (primary) hypertension: Secondary | ICD-10-CM | POA: Diagnosis not present

## 2014-09-10 DIAGNOSIS — Z951 Presence of aortocoronary bypass graft: Secondary | ICD-10-CM | POA: Insufficient documentation

## 2014-09-10 DIAGNOSIS — F209 Schizophrenia, unspecified: Secondary | ICD-10-CM | POA: Insufficient documentation

## 2014-09-10 DIAGNOSIS — I252 Old myocardial infarction: Secondary | ICD-10-CM | POA: Diagnosis not present

## 2014-09-10 DIAGNOSIS — Z7982 Long term (current) use of aspirin: Secondary | ICD-10-CM | POA: Diagnosis not present

## 2014-09-10 DIAGNOSIS — R319 Hematuria, unspecified: Secondary | ICD-10-CM | POA: Diagnosis present

## 2014-09-10 DIAGNOSIS — E785 Hyperlipidemia, unspecified: Secondary | ICD-10-CM | POA: Diagnosis not present

## 2014-09-10 DIAGNOSIS — R300 Dysuria: Secondary | ICD-10-CM | POA: Insufficient documentation

## 2014-09-10 LAB — COMPREHENSIVE METABOLIC PANEL
ALT: 29 U/L (ref 17–63)
AST: 31 U/L (ref 15–41)
Albumin: 4.1 g/dL (ref 3.5–5.0)
Alkaline Phosphatase: 61 U/L (ref 38–126)
Anion gap: 8 (ref 5–15)
BUN: 10 mg/dL (ref 6–20)
CO2: 28 mmol/L (ref 22–32)
Calcium: 9.4 mg/dL (ref 8.9–10.3)
Chloride: 100 mmol/L — ABNORMAL LOW (ref 101–111)
Creatinine, Ser: 1.45 mg/dL — ABNORMAL HIGH (ref 0.61–1.24)
GFR calc Af Amer: 56 mL/min — ABNORMAL LOW (ref >60)
GFR calc non Af Amer: 48 mL/min — ABNORMAL LOW (ref >60)
Glucose, Bld: 129 mg/dL — ABNORMAL HIGH (ref 65–99)
Potassium: 4 mmol/L (ref 3.5–5.1)
Sodium: 136 mmol/L (ref 135–145)
Total Bilirubin: 0.8 mg/dL (ref 0.3–1.2)
Total Protein: 6.9 g/dL (ref 6.5–8.1)

## 2014-09-10 LAB — CBC WITH DIFFERENTIAL/PLATELET
Basophils Absolute: 0 10*3/uL (ref 0.0–0.1)
Basophils Relative: 0 % (ref 0–1)
Eosinophils Absolute: 0.1 10*3/uL (ref 0.0–0.7)
Eosinophils Relative: 1 % (ref 0–5)
HCT: 41.1 % (ref 39.0–52.0)
Hemoglobin: 14.2 g/dL (ref 13.0–17.0)
Lymphocytes Relative: 17 % (ref 12–46)
Lymphs Abs: 1.6 10*3/uL (ref 0.7–4.0)
MCH: 31.4 pg (ref 26.0–34.0)
MCHC: 34.5 g/dL (ref 30.0–36.0)
MCV: 90.9 fL (ref 78.0–100.0)
Monocytes Absolute: 0.9 10*3/uL (ref 0.1–1.0)
Monocytes Relative: 9 % (ref 3–12)
Neutro Abs: 6.7 10*3/uL (ref 1.7–7.7)
Neutrophils Relative %: 73 % (ref 43–77)
Platelets: 149 10*3/uL — ABNORMAL LOW (ref 150–400)
RBC: 4.52 MIL/uL (ref 4.22–5.81)
RDW: 12.5 % (ref 11.5–15.5)
WBC: 9.3 10*3/uL (ref 4.0–10.5)

## 2014-09-10 LAB — URINALYSIS, ROUTINE W REFLEX MICROSCOPIC
Bilirubin Urine: NEGATIVE
Glucose, UA: 100 mg/dL — AB
Ketones, ur: NEGATIVE mg/dL
Nitrite: NEGATIVE
Protein, ur: >300 mg/dL — AB
Specific Gravity, Urine: 1.025 (ref 1.005–1.030)
Urobilinogen, UA: 0.2 mg/dL (ref 0.0–1.0)
pH: 6 (ref 5.0–8.0)

## 2014-09-10 LAB — URINE MICROSCOPIC-ADD ON

## 2014-09-10 NOTE — ED Provider Notes (Signed)
CSN: [id number]     Arrival date & time 09/10/14  0506 History   First MD Initiated Contact with Patient 09/10/14 [id number]     Chief Complaint  Patient presents with  . Hematuria     (Consider location/radiation/quality/duration/timing/severity/associated sxs/prior Treatment) HPI 69 year old male presents to emergency department from home with complaint of onset of dysuria yesterday with mild burning with urination.  This morning upon waking.  He noticed that he had blood in his urine.  Patient reports that he took AZO and over-the-counter prostate medication yesterday to help with a possible urinary tract infection.  He denies any current dysuria.  Denies any pelvic pain, no back pain.  No history of kidney stones.  He reports that he has been seen in the past by urology and told that his prostate was abnormal.  He was given sample medication for his prostate, but he does not know what they are. Past Medical History  Diagnosis Date  . Coronary artery disease     CABG x4 LIMA to LAD, SV graft to RCA, sequential saphenous vein graft to diagnal and OM1  . HTN (hypertension)   . Hyperlipidemia   . Schizophrenia   . MI (myocardial infarction)    Past Surgical History  Procedure Laterality Date  . Cardiac catheterization      EF 50%--severe two vessel obstructive atherosclerotic coronary artery disease--Low normal left ventricle funcrtion  . Coronary artery bypass graft  2007  . Hernia repair    . Tonsillectomy and adenoidectomy Bilateral y-59   Family History  Problem Relation Age of Onset  . Heart disease Mother   . Heart disease Father   . Heart failure Maternal Aunt    History  Substance Use Topics  . Smoking status: Former Smoker    Types: Cigarettes  . Smokeless tobacco: Never Used  . Alcohol Use: No    Review of Systems  See History of Present Illness; otherwise all other systems are reviewed and negative   Allergies  Haloperidol decanoate  Home Medications   Prior  to Admission medications   Medication Sig Start Date End Date Taking? Authorizing Provider  aspirin 81 MG tablet Take 81 mg by mouth daily.    Historical Provider, MD  atorvastatin (LIPITOR) 20 MG tablet Take 1 tablet by mouth  daily 08/26/14   Cassell Clement, MD  busPIRone (BUSPAR) 5 MG tablet Take 1 tablet (5 mg total) by mouth 2 (two) times daily. 08/26/14   Archer Asa, MD  Cholecalciferol (VITAMIN D) 2000 UNITS CAPS Take 1 capsule (2,000 Units total) by mouth daily. Patient not taking: Reported on 07/16/2014 07/10/13   Altha Harm, MD  FLUARIX QUADRIVALENT 0.5 ML injection  12/29/13   Historical Provider, MD  losartan (COZAAR) 100 MG tablet Take 1 tablet by mouth  daily 08/26/14   Cassell Clement, MD  metoprolol tartrate (LOPRESSOR) 25 MG tablet Take 1 tablet by mouth two  times daily 08/26/14   Cassell Clement, MD  Multiple Vitamin (MULTIVITAMIN) tablet Take 1 tablet by mouth daily.      Historical Provider, MD  nitroGLYCERIN (NITROSTAT) 0.4 MG SL tablet ONE TABLET UNDER TONGUE AS NEEDED FOR CHEST PAIN EVERY 5 MINUTES 06/13/13   Cassell Clement, MD  thioridazine (MELLARIL) 25 MG tablet Take 1 tablet (25 mg total) by mouth 2 (two) times daily. 08/26/14   Archer Asa, MD   BP 133/90 mmHg  Pulse 81  Temp(Src) 98.5 F (36.9 C) (Oral)  Resp 14  Ht 6' (1.829 m)  Wt 201 lb (91.173 kg)  BMI 27.25 kg/m2  SpO2 99% Physical Exam  Constitutional: He is oriented to person, place, and time. He appears well-developed and well-nourished.  HENT:  Head: Normocephalic and atraumatic.  Nose: Nose normal.  Mouth/Throat: Oropharynx is clear and moist.  Eyes: Conjunctivae and EOM are normal. Pupils are equal, round, and reactive to light.  Neck: Normal range of motion. Neck supple. No JVD present. No tracheal deviation present. No thyromegaly present.  Cardiovascular: Normal rate, regular rhythm, normal heart sounds and intact distal pulses.  Exam reveals no gallop and no friction rub.   No  murmur heard. Pulmonary/Chest: Effort normal and breath sounds normal. No stridor. No respiratory distress. He has no wheezes. He has no rales. He exhibits no tenderness.  Abdominal: Soft. Bowel sounds are normal. He exhibits no distension and no mass. There is no tenderness. There is no rebound and no guarding.  Musculoskeletal: Normal range of motion. He exhibits no edema or tenderness.  Lymphadenopathy:    He has no cervical adenopathy.  Neurological: He is alert and oriented to person, place, and time. He displays normal reflexes. He exhibits normal muscle tone. Coordination normal.  Skin: Skin is warm and dry. No rash noted. No erythema. No pallor.  Psychiatric: He has a normal mood and affect. His behavior is normal. Judgment and thought content normal.  Nursing note and vitals reviewed.   ED Course  Procedures (including critical care time) Labs Review Labs Reviewed  CBC WITH DIFFERENTIAL/PLATELET - Abnormal; Notable for the following:    Platelets 149 (*)    All other components within normal limits  URINALYSIS, ROUTINE W REFLEX MICROSCOPIC (NOT AT [facility] - Abnormal; Notable for the following:    Color, Urine RED (*)    APPearance TURBID (*)    Glucose, UA 100 (*)    Hgb urine dipstick LARGE (*)    Protein, ur >300 (*)    Leukocytes, UA SMALL (*)    All other components within normal limits  URINE MICROSCOPIC-ADD ON  COMPREHENSIVE METABOLIC PANEL    Imaging Review Ct Renal Stone Study  09/10/2014   CLINICAL DATA:  Hematuria, no pain  EXAM: CT ABDOMEN AND PELVIS WITHOUT CONTRAST  TECHNIQUE: Multidetector CT imaging of the abdomen and pelvis was performed following the standard protocol without IV contrast.  COMPARISON:  MR liver 06/28/2013  FINDINGS: 4 mm nonobstructing stone right lower pole. No other renal stone. No hydronephrosis or renal mass. Bladder wall thickening with mild prostate enlargement.  Lung bases are clear  4 cm mass in the dome of the liver on the right  posteriorly. This has been demonstrated as hemangioma by liver. No other liver mass lesion. Gallbladder and bile ducts normal. Pancreas and spleen normal.  Negative for bowel obstruction or bowel thickening. Normal appendix. Sigmoid diverticulosis.  Negative for free fluid.  No mass or adenopathy.  Schmorl's nodes L2-3 and L3-4.  No lumbar fracture  IMPRESSION: 4 mm nonobstructing stone right kidney. No other renal stone or hydronephrosis  Prostate enlargement with bladder wall thickening  4 cm hemangioma in the liver is stable from prior MRI.  Sigmoid diverticulosis.   Electronically Signed   By: Marlan Palau M.D.   On: 09/10/2014 07:23     EKG Interpretation None      MDM   Final diagnoses:  Hematuria   69 year old male with new onset of hematuria.  Patient has history of thrombocytopenia, platelets only slightly decreased at this time.  Plan for  noncontrast CT scan for possible stone as source.  Patient will be referred to [facility] for further workup.  Marisa Severin, MD 09/10/14 [phone]

## 2014-09-10 NOTE — ED Notes (Addendum)
Pt. reports hematuria and mild dysuria onset last night  , denies injury / no fever or chills/ no flank pain .

## 2014-09-10 NOTE — Discharge Instructions (Signed)
Your workup today did not show a specific cause for the blood in your urine.  Your urine was sent for culture, and if you need antibiotics you will be contacted.  Please contact the urology clinic today for close follow up.   Hematuria Hematuria is blood in your urine. It can be caused by a bladder infection, kidney infection, prostate infection, kidney stone, or cancer of your urinary tract. Infections can usually be treated with medicine, and a kidney stone usually will pass through your urine. If neither of these is the cause of your hematuria, further workup to find out the reason may be needed. It is very important that you tell your health care provider about any blood you see in your urine, even if the blood stops without treatment or happens without causing pain. Blood in your urine that happens and then stops and then happens again can be a symptom of a very serious condition. Also, pain is not a symptom in the initial stages of many urinary cancers. HOME CARE INSTRUCTIONS   Drink lots of fluid, 3-4 quarts a day. If you have been diagnosed with an infection, cranberry juice is especially recommended, in addition to large amounts of water.  Avoid caffeine, tea, and carbonated beverages because they tend to irritate the bladder.  Avoid alcohol because it may irritate the prostate.  Take all medicines as directed by your health care provider.  If you were prescribed an antibiotic medicine, finish it all even if you start to feel better.  If you have been diagnosed with a kidney stone, follow your health care provider's instructions regarding straining your urine to catch the stone.  Empty your bladder often. Avoid holding urine for long periods of time.  After a bowel movement, women should cleanse front to back. Use each tissue only once.  Empty your bladder before and after sexual intercourse if you are a male. SEEK MEDICAL CARE IF:  You develop back pain.  You have a  fever.  You have a feeling of sickness in your stomach (nausea) or vomiting.  Your symptoms are not better in 3 days. Return sooner if you are getting worse. SEEK IMMEDIATE MEDICAL CARE IF:   You develop severe vomiting and are unable to keep the medicine down.  You develop severe back or abdominal pain despite taking your medicines.  You begin passing a large amount of blood or clots in your urine.  You feel extremely weak or faint, or you pass out. MAKE SURE YOU:   Understand these instructions.  Will watch your condition.  Will get help right away if you are not doing well or get worse. Document Released: 03/13/2005 Document Revised: 07/28/2013 Document Reviewed: 11/11/2012 [facility] Patient Information 2015 [place], [place]. This information is not intended to replace advice given to you by your health care provider. Make sure you discuss any questions you have with your health care provider.

## 2014-09-11 ENCOUNTER — Telehealth: Payer: Self-pay | Admitting: *Deleted

## 2014-09-12 LAB — URINE CULTURE: Culture: >=100000

## 2014-09-13 NOTE — Telephone Encounter (Signed)
It appears that his ASA was stopped because of hematuria. His platelet count is only slightly low at 149,000. He will be seeing urologist soon. Okay to leave ASA off short-term, then I would recommend that he restart it after urology problem is cleared up.  He should also discuss ASA with his urologist.  Alternative such as plavix would not be much different in terms of bleeding potential.

## 2014-09-14 ENCOUNTER — Telehealth (HOSPITAL_BASED_OUTPATIENT_CLINIC_OR_DEPARTMENT_OTHER): Payer: Self-pay | Admitting: Emergency Medicine

## 2014-09-14 NOTE — Telephone Encounter (Signed)
Spoke with patient and gave  Dr. Yevonne Pax recommendations Patient did see his urologist Friday and has follow up with him July 8

## 2014-09-14 NOTE — Telephone Encounter (Signed)
Post ED Visit - Positive Culture Follow-up  Culture report reviewed by antimicrobial stewardship pharmacist: []  Wes Kathryne Eriksson, Pharm.D., BCPS [x]  Celedonio Miyamoto, Pharm.D., BCPS []  Georgina Pillion, [address].D., BCPS []  Friendly, [address].D., BCPS, AAHIVP []  Estella Husk, Pharm.D., BCPS, AAHIVP []  Elder Cyphers, [address].D., BCPS  Positive urine culture Citrobacter Treated with none, asymtpomatic,  no further patient follow-up is required at this time.  Berle Mull 09/14/2014, 7:25 AM

## 2014-09-15 NOTE — Telephone Encounter (Signed)
Thanks for the followup!  

## 2014-10-02 ENCOUNTER — Telehealth: Payer: Self-pay | Admitting: Family Medicine

## 2014-11-27 ENCOUNTER — Encounter (HOSPITAL_COMMUNITY): Payer: Self-pay | Admitting: Psychiatry

## 2014-11-27 ENCOUNTER — Ambulatory Visit (INDEPENDENT_AMBULATORY_CARE_PROVIDER_SITE_OTHER): Payer: Medicare Other | Admitting: Psychiatry

## 2014-11-27 VITALS — BP 122/84 | HR 76 | Ht 72.0 in | Wt 204.6 lb

## 2014-11-27 DIAGNOSIS — F200 Paranoid schizophrenia: Secondary | ICD-10-CM

## 2014-11-27 MED ORDER — THIORIDAZINE HCL 25 MG PO TABS: 25.0000 mg | ORAL_TABLET | Freq: Two times a day (BID) | ORAL | Status: DC

## 2014-11-27 MED ORDER — BUSPIRONE HCL 5 MG PO TABS: 5.0000 mg | ORAL_TABLET | Freq: Two times a day (BID) | ORAL | Status: DC

## 2014-11-27 NOTE — Progress Notes (Signed)
[facility] MD Progress Note  11/27/2014 10:33 AM Gilbert Harris  MRN:  [id number] Subjective: [place] Principal Problem: Schizophrenia, chronic Diagnosis:  Schizophrenia, chronic Patient Active Problem List   Diagnosis Date Noted  . Chronic kidney disease (CKD) stage G2/A3, mildly decreased glomerular filtration rate (GFR) between 60-89 mL/min/1.73 square meter and albuminuria creatinine ratio greater than 300 mg/g [N18.2] 02/12/2014  . Thrombocytopenia [D69.6] 07/10/2013  . HTN (hypertension) [I10] 06/09/2013  . Annual physical exam [Z00.00] 06/09/2013  . Hyperglycemia [R73.9] 06/09/2013  . Benign hypertensive heart disease without heart failure [I11.9] 07/14/2011  . HYPERCHOLESTEROLEMIA [E78.0] 08/13/2008  . Essential hypertension [I10] 08/13/2008  . Coronary atherosclerosis [I25.10] 08/13/2008   Total Time spent with patient: 30 minutes   Past Medical History:  Past Medical History  Diagnosis Date  . Coronary artery disease     CABG x4 LIMA to LAD, SV graft to RCA, sequential saphenous vein graft to diagnal and OM1  . HTN (hypertension)   . Hyperlipidemia   . Schizophrenia   . MI (myocardial infarction)     Past Surgical History  Procedure Laterality Date  . Cardiac catheterization      EF 50%--severe two vessel obstructive atherosclerotic coronary artery disease--Low normal left ventricle funcrtion  . Coronary artery bypass graft  2007  . Hernia repair    . Tonsillectomy and adenoidectomy Bilateral y-59   Family History:  Family History  Problem Relation Age of Onset  . Heart disease Mother   . Heart disease Father   . Heart failure Maternal Aunt    Social History:  History  Alcohol Use No     History  Drug Use No    Social History   Social History  . Marital Status: Single    Spouse Name: N/A  . Number of Children: N/A  . Years of Education: N/A   Social History Main Topics  . Smoking status: Former Smoker    Types: Cigarettes  . Smokeless tobacco: Never Used   . Alcohol Use: No  . Drug Use: No  . Sexual Activity: Not Currently   Other Topics Concern  . None   Social History Narrative   Additional History:    Sleep: Good  Appetite:  Good   Assessment: Today the patient is seen one time. The patient is doing very well. Is very well dressed. He shares with me that he is taking classes in art. He's very articulate. The patient is organized lucid clear and his thinking. He denies depression. He sleeping and eating well. He's got good energy and can concentrate very well. He denies anxiety. He denies any psychotic symptoms at all. The patient denies the use of alcohol or illicit drugs. He's happy with the way life is right now. She's got a good supportive system and likes the environment he is living in. Is focused organized and goal-directed. He is few negative symptoms and no positive symptoms in that he takes his medicine regularly and has no psychosis. The patient demonstrates no evidence of tardive dyskinesia. He denies any evidence consistent with EPS. The patient is medically healthy. He denies chest pain shortness of breath or any neurological symptoms.  Musculoskeletal: Strength & Muscle Tone: within normal limits Gait & Station: normal Patient leans: Right   Psychiatric Specialty Exam: Physical Exam  ROS  Blood pressure 122/84, pulse 76, height 6' (1.829 m), weight 204 lb 9.6 oz (92.806 kg).Body mass index is 27.74 kg/(m^2).  General Appearance: Meticulous  Eye Contact::  Good  Speech:  Clear and Coherent  Volume:  Normal  Mood:  NA  Affect:  Congruent  Thought Process:  Goal Directed  Orientation:  NA  Thought Content:  Negative  Suicidal Thoughts:  No  Homicidal Thoughts:  No  Memory:  NA  Judgement:  Good  Insight:  Good  Psychomotor Activity:  Normal  Concentration:  Good  Recall:  Good  Fund of Knowledge:Good  Language: Good  Akathisia:  No  Handed:  Right  AIMS (if indicated):     Assets:  Communication Skills   ADL's:  Intact  Cognition: WNL  Sleep:        Current Medications: Current Outpatient Prescriptions  Medication Sig Dispense Refill  . aspirin (ASPIRIN EC) 81 MG EC tablet Take 81 mg by mouth daily. Swallow whole.    Marland Kitchen atorvastatin (LIPITOR) 20 MG tablet Take 1 tablet by mouth  daily 90 tablet 1  . busPIRone (BUSPAR) 5 MG tablet Take 1 tablet (5 mg total) by mouth 2 (two) times daily. 60 tablet 6  . losartan (COZAAR) 100 MG tablet Take 1 tablet by mouth  daily 90 tablet 1  . metoprolol tartrate (LOPRESSOR) 25 MG tablet Take 1 tablet by mouth two  times daily 180 tablet 1  . Multiple Vitamin (MULTIVITAMIN) tablet Take 1 tablet by mouth daily.      . nitroGLYCERIN (NITROSTAT) 0.4 MG SL tablet ONE TABLET UNDER TONGUE AS NEEDED FOR CHEST PAIN EVERY 5 MINUTES 25 tablet 2  . thioridazine (MELLARIL) 25 MG tablet Take 1 tablet (25 mg total) by mouth 2 (two) times daily. 60 tablet 6  . Cholecalciferol (VITAMIN D) 2000 UNITS CAPS Take 1 capsule (2,000 Units total) by mouth daily. (Patient not taking: Reported on 07/16/2014) 30 capsule 11  . FLUARIX QUADRIVALENT 0.5 ML injection      No current facility-administered medications for this visit.    Lab Results: No results found for this or any previous visit (from the past 48 hour(s)).  Physical Findings: AIMS:  , ,  ,  ,    CIWA:    COWS:     Treatment Plan Summary: At this time this patient's paranoid schizophrenia is well controlled. He takes Mellaril 25 mg twice a day and he takes BuSpar 5 mg twice a day. These are low doses of both of these agents but he seems to be doing very well. He is stable functioning at a very high level. Is very independent. He lives in [place] very supportive environment. The patient denies being suicidal or homicidal. He is free of psychosis. This patient will return to see me in 5 months.   Medical Decision Making:  Established Problem, Stable/Improving (1)     Jayley Hustead IRVING 11/27/2014, 10:33 AM

## 2015-01-14 ENCOUNTER — Ambulatory Visit (INDEPENDENT_AMBULATORY_CARE_PROVIDER_SITE_OTHER): Payer: Medicare Other | Admitting: Family Medicine

## 2015-01-14 VITALS — BP 95/65 | HR 70 | Temp 97.4°F | Resp 16 | Ht 72.0 in | Wt 202.0 lb

## 2015-01-14 DIAGNOSIS — I10 Essential (primary) hypertension: Secondary | ICD-10-CM | POA: Diagnosis not present

## 2015-01-14 DIAGNOSIS — R739 Hyperglycemia, unspecified: Secondary | ICD-10-CM

## 2015-01-14 DIAGNOSIS — Z23 Encounter for immunization: Secondary | ICD-10-CM

## 2015-01-14 DIAGNOSIS — R7303 Prediabetes: Secondary | ICD-10-CM | POA: Diagnosis not present

## 2015-01-14 DIAGNOSIS — E7800 Pure hypercholesterolemia, unspecified: Secondary | ICD-10-CM

## 2015-01-14 LAB — POCT URINALYSIS DIP (DEVICE)
Bilirubin Urine: NEGATIVE
Glucose, UA: NEGATIVE mg/dL
Hgb urine dipstick: NEGATIVE
Leukocytes, UA: NEGATIVE
Nitrite: NEGATIVE
Protein, ur: NEGATIVE mg/dL
Specific Gravity, Urine: 1.025 (ref 1.005–1.030)
Urobilinogen, UA: 0.2 mg/dL (ref 0.0–1.0)
pH: 5.5 (ref 5.0–8.0)

## 2015-01-14 LAB — COMPLETE METABOLIC PANEL WITH GFR
ALT: 23 U/L (ref 9–46)
AST: 22 U/L (ref 10–35)
Albumin: 4.2 g/dL (ref 3.6–5.1)
Alkaline Phosphatase: 58 U/L (ref 40–115)
BUN: 10 mg/dL (ref 7–25)
CO2: 23 mmol/L (ref 20–31)
Calcium: 9.1 mg/dL (ref 8.6–10.3)
Chloride: 106 mmol/L (ref 98–110)
Creat: 1.2 mg/dL (ref 0.70–1.25)
GFR, Est African American: 71 mL/min (ref >=60)
GFR, Est Non African American: 61 mL/min (ref >=60)
Glucose, Bld: 78 mg/dL (ref 65–99)
Potassium: 4.3 mmol/L (ref 3.5–5.3)
Sodium: 139 mmol/L (ref 135–146)
Total Bilirubin: 0.7 mg/dL (ref 0.2–1.2)
Total Protein: 6.8 g/dL (ref 6.1–8.1)

## 2015-01-14 LAB — HEMOGLOBIN A1C
Hgb A1c MFr Bld: 5.7 % — ABNORMAL HIGH (ref <5.7)
Mean Plasma Glucose: 117 mg/dL — ABNORMAL HIGH (ref <117)

## 2015-01-14 NOTE — Progress Notes (Signed)
Subjective:    Patient ID: Gilbert Harris, male    DOB: 16-Jul-1945, 69 y.o.   MRN: [id number]  Hypertension This is a chronic problem. The problem is controlled. Pertinent negatives include no blurred vision, chest pain, headaches, malaise/fatigue, neck pain, orthopnea, palpitations, peripheral edema, PND, shortness of breath or sweats. Risk factors for coronary artery disease include male gender. The current treatment provides moderate improvement. There are no compliance problems.  There is no history of angina, kidney disease, CAD/MI, CVA, heart failure, left ventricular hypertrophy or retinopathy.  Hyperlipidemia This is a chronic problem. The problem is controlled. Recent lipid tests were reviewed and are normal. Associated symptoms include leg pain. Pertinent negatives include no chest pain, focal sensory loss, focal weakness, myalgias or shortness of breath. Current antihyperlipidemic treatment includes statins. The current treatment provides significant improvement of lipids. There are no compliance problems.  There are no known risk factors for coronary artery disease.   Gilbert Harris, a 69 year old patient with hypertension and hyperlipidemia presents for a 6 month follow up.  Patient states that he was seen by cardiologist and had an EKG performed 3 months ago. Reports that he is taking medications consistently and is feeling good. Reports that he has changed diet and started a regular exercise routine.He states that he goes to the [facility] for an exercise program several times per week.  Patient currently denies shortness of breath, dizziness, edema, claudication, chest pains, or nausea.  Immunization History  Administered Date(s) Administered  . Influenza Whole 12/22/2010  . Pneumococcal Polysaccharide-23 02/12/2014  . Tdap 07/10/2013  . Zoster 02/12/2014   Past Medical History  Diagnosis Date  . Coronary artery disease     CABG x4 LIMA to LAD, SV graft to RCA, sequential saphenous  vein graft to diagnal and OM1  . HTN (hypertension)   . Hyperlipidemia   . Schizophrenia   . MI (myocardial infarction)      Medication List       This list is accurate as of: 01/14/15 11:59 PM.  Always use your most recent med list.               aspirin EC 81 MG EC tablet  Generic drug:  aspirin  Take 81 mg by mouth daily. Swallow whole.     atorvastatin 20 MG tablet  Commonly known as:  LIPITOR  Take 1 tablet by mouth  daily     busPIRone 5 MG tablet  Commonly known as:  BUSPAR  Take 1 tablet (5 mg total) by mouth 2 (two) times daily.     losartan 100 MG tablet  Commonly known as:  COZAAR  Take 1 tablet by mouth  daily     metoprolol tartrate 25 MG tablet  Commonly known as:  LOPRESSOR  Take 1 tablet by mouth two  times daily     multivitamin tablet  Take 1 tablet by mouth daily.     nitroGLYCERIN 0.4 MG SL tablet  Commonly known as:  NITROSTAT  ONE TABLET UNDER TONGUE AS NEEDED FOR CHEST PAIN EVERY 5 MINUTES     thioridazine 25 MG tablet  Commonly known as:  MELLARIL  Take 1 tablet (25 mg total) by mouth 2 (two) times daily.     Vitamin D 2000 UNITS Caps  Take 1 capsule (2,000 Units total) by mouth daily.       Review of Systems  Constitutional: Negative.  Negative for fever, malaise/fatigue, fatigue and unexpected weight change.  HENT: Negative.   Eyes: Negative.  Negative for blurred vision.  Respiratory: Negative.  Negative for shortness of breath.   Cardiovascular: Negative.  Negative for chest pain, palpitations, orthopnea and PND.  Gastrointestinal: Negative.  Negative for nausea, diarrhea and rectal pain.  Endocrine: Negative.  Negative for polydipsia, polyphagia and polyuria.  Genitourinary: Negative.   Musculoskeletal: Negative.  Negative for myalgias and neck pain.  Skin: Negative.   Allergic/Immunologic: Negative.   Neurological: Negative for dizziness, focal weakness, syncope, weakness and headaches.  Hematological: Negative.    Psychiatric/Behavioral: Negative.    BP 95/65 mmHg  Pulse 70  Temp(Src) 97.4 F (36.3 C) (Oral)  Resp 16  Ht 6' (1.829 m)  Wt 202 lb (91.627 kg)  BMI 27.39 kg/m2    Objective:   Physical Exam  Constitutional: He is oriented to person, place, and time. He appears well-developed and well-nourished.  HENT:  Head: Normocephalic and atraumatic.  Eyes: Pupils are equal, round, and reactive to light.  Neck: Normal range of motion. Neck supple.  Cardiovascular: Normal rate, regular rhythm and normal heart sounds.   Pulmonary/Chest: Effort normal and breath sounds normal.  Abdominal: Soft. Bowel sounds are normal.  Musculoskeletal: Normal range of motion.  Neurological: He is alert and oriented to person, place, and time.  Skin: Skin is warm and dry.  Psychiatric: He has a normal mood and affect. His behavior is normal. Judgment and thought content normal.     BP 95/65 mmHg  Pulse 70  Temp(Src) 97.4 F (36.3 C) (Oral)  Resp 16  Ht 6' (1.829 m)  Wt 202 lb (91.627 kg)  BMI 27.39 kg/m2     Assessment & Plan:   1. Essential hypertension Blood pressure is at goal on current medication regimen. Will continue medications as previously prescribed. No proteinuria present.  Patient taking a Yoga and [employer]ett[employer] Balance course, at [employer] 3 times per week. Patient is currently drinks supplemental shakes and meals to maintain weight. Reports that he drinks 4-5 glasses of water per day. - POCT urinalysis dipstick - COMPLETE METABOLIC PANEL WITH GFR  2. Hyperglycemia Reviewed previous hemoglobin A1C 5.8%. Patient is exercising consistently.  - Hemoglobin A1c  3. Prediabetes Refer to #2  4. Need for immunization against influenza - Flu Vaccine QUAD 36+ mos IM (Fluarix)   Preventative Care:  Cardiologist: Heart and cholesterol.  Anne FuLarry Gibson; Urologist, Digital prostate, 1 year ago  Colonoscopy was 1 year ago; Dr. Jeani HawkingPatrick Hung Will follow up in 6 months for Medicare  Wellness Exam   The patient was given clear instructions to go to ER or return to medical center if symptoms do not improve, worsen or new problems develop. The patient verbalized understanding. Will notify patient with laboratory results. Massie MaroonHollis,Lachina M, FNP

## 2015-01-14 NOTE — Patient Instructions (Signed)
DASH Eating Plan °DASH stands for "Dietary Approaches to Stop Hypertension." The DASH eating plan is a healthy eating plan that has been shown to reduce high blood pressure (hypertension). Additional health benefits may include reducing the risk of type 2 diabetes mellitus, heart disease, and stroke. The DASH eating plan may also help with weight loss. °WHAT DO I NEED TO KNOW ABOUT THE DASH EATING PLAN? °For the DASH eating plan, you will follow these general guidelines: °· Choose foods with a percent daily value for sodium of less than 5% (as listed on the food label). °· Use salt-free seasonings or herbs instead of table salt or sea salt. °· Check with your health care provider or pharmacist before using salt substitutes. °· Eat lower-sodium products, often labeled as "lower sodium" or "no salt added." °· Eat fresh foods. °· Eat more vegetables, fruits, and low-fat dairy products. °· Choose whole grains. Look for the word "whole" as the first word in the ingredient list. °· Choose fish and skinless chicken or turkey more often than red meat. Limit fish, poultry, and meat to 6 oz (170 g) each day. °· Limit sweets, desserts, sugars, and sugary drinks. °· Choose heart-healthy fats. °· Limit cheese to 1 oz (28 g) per day. °· Eat more home-cooked food and less restaurant, buffet, and fast food. °· Limit fried foods. °· Cook foods using methods other than frying. °· Limit canned vegetables. If you do use them, rinse them well to decrease the sodium. °· When eating at a restaurant, ask that your food be prepared with less salt, or no salt if possible. °WHAT FOODS CAN I EAT? °Seek help from a dietitian for individual calorie needs. °Grains °Whole grain or whole wheat bread. Brown rice. Whole grain or whole wheat pasta. Quinoa, bulgur, and whole grain cereals. Low-sodium cereals. Corn or whole wheat flour tortillas. Whole grain cornbread. Whole grain crackers. Low-sodium crackers. °Vegetables °Fresh or frozen vegetables  (raw, steamed, roasted, or grilled). Low-sodium or reduced-sodium tomato and vegetable juices. Low-sodium or reduced-sodium tomato sauce and paste. Low-sodium or reduced-sodium canned vegetables.  °Fruits °All fresh, canned (in natural juice), or frozen fruits. °Meat and Other Protein Products °Ground beef (85% or leaner), grass-fed beef, or beef trimmed of fat. Skinless chicken or turkey. Ground chicken or turkey. Pork trimmed of fat. All fish and seafood. Eggs. Dried beans, peas, or lentils. Unsalted nuts and seeds. Unsalted canned beans. °Dairy °Low-fat dairy products, such as skim or 1% milk, 2% or reduced-fat cheeses, low-fat ricotta or cottage cheese, or plain low-fat yogurt. Low-sodium or reduced-sodium cheeses. °Fats and Oils °Tub margarines without trans fats. Light or reduced-fat mayonnaise and salad dressings (reduced sodium). Avocado. Safflower, olive, or canola oils. Natural peanut or almond butter. °Other °Unsalted popcorn and pretzels. °The items listed above may not be a complete list of recommended foods or beverages. Contact your dietitian for more options. °WHAT FOODS ARE NOT RECOMMENDED? °Grains °White bread. White pasta. White rice. Refined cornbread. Bagels and croissants. Crackers that contain trans fat. °Vegetables °Creamed or fried vegetables. Vegetables in a cheese sauce. Regular canned vegetables. Regular canned tomato sauce and paste. Regular tomato and vegetable juices. °Fruits °Dried fruits. Canned fruit in light or heavy syrup. Fruit juice. °Meat and Other Protein Products °Fatty cuts of meat. Ribs, chicken wings, bacon, sausage, bologna, salami, chitterlings, fatback, hot dogs, bratwurst, and packaged luncheon meats. Salted nuts and seeds. Canned beans with salt. °Dairy °Whole or 2% milk, cream, half-and-half, and cream cheese. Whole-fat or sweetened yogurt. Full-fat   cheeses or blue cheese. Nondairy creamers and whipped toppings. Processed cheese, cheese spreads, or cheese  curds. °Condiments °Onion and garlic salt, seasoned salt, table salt, and sea salt. Canned and packaged gravies. Worcestershire sauce. Tartar sauce. Barbecue sauce. Teriyaki sauce. Soy sauce, including reduced sodium. Steak sauce. Fish sauce. Oyster sauce. Cocktail sauce. Horseradish. Ketchup and mustard. Meat flavorings and tenderizers. Bouillon cubes. Hot sauce. Tabasco sauce. Marinades. Taco seasonings. Relishes. °Fats and Oils °Butter, stick margarine, lard, shortening, ghee, and bacon fat. Coconut, palm kernel, or palm oils. Regular salad dressings. °Other °Pickles and olives. Salted popcorn and pretzels. °The items listed above may not be a complete list of foods and beverages to avoid. Contact your dietitian for more information. °WHERE CAN I FIND MORE INFORMATION? °National Heart, Lung, and [employer] Institute: www.nhlbi.nih.gov/health/health-topics/topics/dash/ °  °This information is not intended to replace advice given to you by your health care provider. Make sure you discuss any questions you have with your health care provider. °  °Document Released: 03/02/2011 Document Revised: 04/03/2014 Document Reviewed: 01/15/2013 °Elsevier Interactive Patient Education ©2016 [employer] ° °Hypertension °Hypertension, commonly called high blood pressure, is when the force of blood pumping through your arteries is too strong. Your arteries are the blood vessels that carry blood from your heart throughout your body. A blood pressure reading consists of a higher number over a lower number, such as 110/72. The higher number (systolic) is the pressure inside your arteries when your heart pumps. The lower number (diastolic) is the pressure inside your arteries when your heart relaxes. Ideally you want your blood pressure below 120/80. °Hypertension forces your heart to work harder to pump blood. Your arteries may become narrow or stiff. Having untreated or uncontrolled hypertension can cause heart attack, stroke, kidney  disease, and other problems. °RISK FACTORS °Some risk factors for high blood pressure are controllable. Others are not.  °Risk factors you cannot control include:  °· Race. You may be at higher risk if you are African American. °· Age. Risk increases with age. °· Gender. Men are at higher risk than women before age 45 years. After age 65, women are at higher risk than men. °Risk factors you can control include: °· Not getting enough exercise or physical activity. °· Being overweight. °· Getting too much fat, sugar, calories, or salt in your diet. °· Drinking too much alcohol. °SIGNS AND SYMPTOMS °Hypertension does not usually cause signs or symptoms. Extremely high blood pressure (hypertensive crisis) may cause headache, anxiety, shortness of breath, and nosebleed. °DIAGNOSIS °To check if you have hypertension, your health care provider will measure your blood pressure while you are seated, with your arm held at the level of your heart. It should be measured at least twice using the same arm. Certain conditions can cause a difference in blood pressure between your right and left arms. A blood pressure reading that is higher than normal on one occasion does not mean that you need treatment. If it is not clear whether you have high blood pressure, you may be asked to return on a different day to have your blood pressure checked again. Or, you may be asked to monitor your blood pressure at home for 1 or more weeks. °TREATMENT °Treating high blood pressure includes making lifestyle changes and possibly taking medicine. Living a healthy lifestyle can help lower high blood pressure. You may need to change some of your habits. °Lifestyle changes may include: °· Following the DASH diet. This diet is high in fruits, vegetables, and whole   grains. It is low in salt, red meat, and added sugars. °· Keep your sodium intake below 2,300 mg per day. °· Getting at least 30-45 minutes of aerobic exercise at least 4 times per  week. °· Losing weight if necessary. °· Not smoking. °· Limiting alcoholic beverages. °· Learning ways to reduce stress. °Your health care provider may prescribe medicine if lifestyle changes are not enough to get your blood pressure under control, and if one of the following is true: °· You are 18-59 years of age and your systolic blood pressure is above 140. °· You are 60 years of age or older, and your systolic blood pressure is above 150. °· Your diastolic blood pressure is above 90. °· You have diabetes, and your systolic blood pressure is over 140 or your diastolic blood pressure is over 90. °· You have kidney disease and your blood pressure is above 140/90. °· You have heart disease and your blood pressure is above 140/90. °Your personal target blood pressure may vary depending on your medical conditions, your age, and other factors. °HOME CARE INSTRUCTIONS °· Have your blood pressure rechecked as directed by your health care provider.   °· Take medicines only as directed by your health care provider. Follow the directions carefully. Blood pressure medicines must be taken as prescribed. The medicine does not work as well when you skip doses. Skipping doses also puts you at risk for problems. °· Do not smoke.   °· Monitor your blood pressure at home as directed by your health care provider.  °SEEK MEDICAL CARE IF:  °· You think you are having a reaction to medicines taken. °· You have recurrent headaches or feel dizzy. °· You have swelling in your ankles. °· You have trouble with your vision. °SEEK IMMEDIATE MEDICAL CARE IF: °· You develop a severe headache or confusion. °· You have unusual weakness, numbness, or feel faint. °· You have severe chest or abdominal pain. °· You vomit repeatedly. °· You have trouble breathing. °MAKE SURE YOU:  °· Understand these instructions. °· Will watch your condition. °· Will get help right away if you are not doing well or get worse. °  °This information is not intended to  replace advice given to you by your health care provider. Make sure you discuss any questions you have with your health care provider. °  °Document Released: 03/13/2005 Document Revised: 07/28/2014 Document Reviewed: 01/03/2013 °Elsevier Interactive Patient Education ©2016 Elsevier Inc. ° °

## 2015-01-15 ENCOUNTER — Encounter: Payer: Self-pay | Admitting: Family Medicine

## 2015-01-25 ENCOUNTER — Encounter: Payer: Self-pay | Admitting: Cardiology

## 2015-01-25 ENCOUNTER — Ambulatory Visit (INDEPENDENT_AMBULATORY_CARE_PROVIDER_SITE_OTHER): Payer: Medicare Other | Admitting: Cardiology

## 2015-01-25 VITALS — BP 120/70 | HR 61 | Ht 72.0 in | Wt 203.4 lb

## 2015-01-25 DIAGNOSIS — I259 Chronic ischemic heart disease, unspecified: Secondary | ICD-10-CM | POA: Diagnosis not present

## 2015-01-25 DIAGNOSIS — E7800 Pure hypercholesterolemia, unspecified: Secondary | ICD-10-CM | POA: Diagnosis not present

## 2015-01-25 DIAGNOSIS — I10 Essential (primary) hypertension: Secondary | ICD-10-CM

## 2015-01-25 LAB — LIPID PANEL
Cholesterol: 126 mg/dL (ref 125–200)
HDL: 56 mg/dL (ref >=40)
LDL Cholesterol: 51 mg/dL (ref <130)
Total CHOL/HDL Ratio: 2.3 Ratio (ref <=5.0)
Triglycerides: 96 mg/dL (ref <150)
VLDL: 19 mg/dL (ref <30)

## 2015-01-25 LAB — HEPATIC FUNCTION PANEL
ALT: 31 U/L (ref 9–46)
AST: 30 U/L (ref 10–35)
Albumin: 4.2 g/dL (ref 3.6–5.1)
Alkaline Phosphatase: 60 U/L (ref 40–115)
Bilirubin, Direct: 0.2 mg/dL (ref <=0.2)
Indirect Bilirubin: 0.4 mg/dL (ref 0.2–1.2)
Total Bilirubin: 0.6 mg/dL (ref 0.2–1.2)
Total Protein: 7 g/dL (ref 6.1–8.1)

## 2015-01-25 LAB — BASIC METABOLIC PANEL
BUN: 9 mg/dL (ref 7–25)
CO2: 30 mmol/L (ref 20–31)
Calcium: 9.9 mg/dL (ref 8.6–10.3)
Chloride: 101 mmol/L (ref 98–110)
Creat: 1.27 mg/dL — ABNORMAL HIGH (ref 0.70–1.25)
Glucose, Bld: 80 mg/dL (ref 65–99)
Potassium: 4.7 mmol/L (ref 3.5–5.3)
Sodium: 137 mmol/L (ref 135–146)

## 2015-01-25 NOTE — Progress Notes (Signed)
Quick Note:  Please report to patient. The recent labs are stable. Continue same medication and careful diet. ______ 

## 2015-01-25 NOTE — Progress Notes (Signed)
Cardiology Office Note   Date:  01/25/2015   ID:  Gilbert Harris, DOB 10-Sep-1945, MRN [id number]  PCP:  Altha Harm., MD  Cardiologist: Cassell Clement MD  No chief complaint on file.     History of Present Illness: Gilbert Harris is a 69 y.o. male who presents for scheduled follow-up visit  This pleasant 69 year old gentleman is seen for a scheduled followup office visit. He has a past history of ischemic heart disease. He underwent coronary artery bypass graft surgery in 2007. He had a previous remote inferior wall myocardial infarction. His last stress test was in 2009 and was negative. The patient has a history of high blood pressure and hypercholesterolemia. He also has psychiatric problems and is followed by his psychiatrist about every 4 months. The patient now has a new psychiatrist Dr.Mengtsu. Previous psychiatrist retired  Since last visit the patient has been doing well. He walks at least twice a week in his neighborhood for exercise. He also goes to the [facility] 3 times a week and works out. He lives in a group home called [facility] and he has lived there for a total of 20 years. Psychiatric diagnosis is anxiety and depression according to the patient.  Since last visit he has had no new cardiac symptoms. Denies chest pain shortness of breath palpitations dizziness or syncope. The patient now has a PCP. He recently had colonoscopy with removal of 6 polyps by Dr. Elnoria Howard. He also had an MRI of the abdomen and ultrasound of the abdomen both of which were unrevealing according to the patient. He had a difficult time with the MRI because of claustrophobia. The patient is a Landscape architect by profession. He still enjoys doing drafting as a hobby. He has had more difficulty getting draftsman supplies because several of his previous sources have closed down. He reports that last week he did have an episode of low blood pressure while at exercise class at the [facility].  He had skipped breakfast that morning and had not had much to drink.  I suspect he was mildly dehydrated.   Past Medical History  Diagnosis Date  . Coronary artery disease     CABG x4 LIMA to LAD, SV graft to RCA, sequential saphenous vein graft to diagnal and OM1  . HTN (hypertension)   . Hyperlipidemia   . Schizophrenia (HCC)   . MI (myocardial infarction) [facility])     Past Surgical History  Procedure Laterality Date  . Cardiac catheterization      EF 50%--severe two vessel obstructive atherosclerotic coronary artery disease--Low normal left ventricle funcrtion  . Coronary artery bypass graft  2007  . Hernia repair    . Tonsillectomy and adenoidectomy Bilateral y-59     Current Outpatient Prescriptions  Medication Sig Dispense Refill  . aspirin (ASPIRIN EC) 81 MG EC tablet Take 81 mg by mouth daily. Swallow whole.    Marland Kitchen atorvastatin (LIPITOR) 20 MG tablet Take 1 tablet by mouth  daily 90 tablet 1  . busPIRone (BUSPAR) 5 MG tablet Take 1 tablet (5 mg total) by mouth 2 (two) times daily. 60 tablet 6  . losartan (COZAAR) 100 MG tablet Take 1 tablet by mouth  daily 90 tablet 1  . metoprolol tartrate (LOPRESSOR) 25 MG tablet Take 1 tablet by mouth two  times daily 180 tablet 1  . Multiple Vitamin (MULTIVITAMIN) tablet Take 1 tablet by mouth daily.      . nitroGLYCERIN (NITROSTAT) 0.4 MG SL tablet  ONE TABLET UNDER TONGUE AS NEEDED FOR CHEST PAIN EVERY 5 MINUTES 25 tablet 2  . thioridazine (MELLARIL) 25 MG tablet Take 1 tablet (25 mg total) by mouth 2 (two) times daily. 60 tablet 6   No current facility-administered medications for this visit.    Allergies:   Haloperidol decanoate    Social History:  The patient  reports that he has quit smoking. His smoking use included Cigarettes. He has never used smokeless tobacco. He reports that he does not drink alcohol or use illicit drugs.   Family History:  The patient's family history includes Heart disease in his father and  mother; Heart failure in his maternal aunt.    ROS:  Please see the history of present illness.   Otherwise, review of systems are positive for none.   All other systems are reviewed and negative.    PHYSICAL EXAM: VS:  BP 120/70 mmHg  Pulse 61  Ht 6' (1.829 m)  Wt 203 lb 6.4 oz (92.262 kg)  BMI 27.58 kg/m2 , BMI Body mass index is 27.58 kg/(m^2). GEN: Well nourished, well developed, in no acute distress HEENT: normal Neck: no JVD, carotid bruits, or masses Cardiac: RRR; no murmurs, rubs, or gallops,no edema  Respiratory:  clear to auscultation bilaterally, normal work of breathing GI: soft, nontender, nondistended, + BS MS: no deformity or atrophy Skin: warm and dry, no rash Neuro:  Strength and sensation are intact Psych: euthymic mood, full affect   EKG:  EKG is not ordered today.    Recent Labs: 09/10/2014: Hemoglobin 14.2; Platelets 149* 01/14/2015: ALT 23; BUN 10; Creat 1.20; Potassium 4.3; Sodium 139    Lipid Panel    Component Value Date/Time   CHOL 111 07/15/2014 1056   TRIG 63.0 07/15/2014 1056   HDL 49.80 07/15/2014 1056   CHOLHDL 2 07/15/2014 1056   VLDL 12.6 07/15/2014 1056   LDLCALC 49 07/15/2014 1056      Wt Readings from Last 3 Encounters:  01/25/15 203 lb 6.4 oz (92.262 kg)  01/14/15 202 lb (91.627 kg)  11/27/14 204 lb 9.6 oz (92.806 kg)        ASSESSMENT AND PLAN:  1. Hypercholesterolemia 2. ischemic heart disease with remote inferior wall myocardial infarction and CABG in 2007 3. essential hypertension 4. Former smoker. He quit smoking when he had his bypass surgery in 2007. 5. Chronic anxiety and depression. He has lived successfully in a group home for more than 20 years.  Disposition: Continue same medication. Recheck in 6 months for office visit EKG lipid panel hepatic function panel and basal metabolic panel.   Current medicines are reviewed at length with the patient today.  The patient does not have concerns regarding  medicines.  The following changes have been made:  no change  Labs/ tests ordered today include:  Orders Placed This Encounter  Procedures  . Lipid panel  . Hepatic function panel  . Basic metabolic panel    Disposition: We are checking lab work today.  He will return in 6 months for office visit EKG and fasting lipid panel hepatic function panel and basal metabolic panel  Signed, Cassell Clementhomas Reeanna Acri MD 01/25/2015 1:20 PM    [facility] [address], [place], [place]  [address] Phone: [phone]; Fax: [phone]

## 2015-01-25 NOTE — Patient Instructions (Addendum)
Medication Instructions:  Your physician recommends that you continue on your current medications as directed. Please refer to the Current Medication list given to you today.  Labwork: Lp/bmet/hfp  Testing/Procedures: none  Follow-Up: Your physician recommends that you schedule a follow-up appointment in: 6 months with fasting labs (lp/bmet/hfp) and ekg with Dawayne PatriciaLori G NP or Bing NeighborsScott W PA   If you need a refill on your cardiac medications before your next appointment, please call your pharmacy.

## 2015-02-04 ENCOUNTER — Other Ambulatory Visit: Payer: Self-pay | Admitting: Student

## 2015-02-04 ENCOUNTER — Emergency Department (HOSPITAL_COMMUNITY)
Admission: EM | Admit: 2015-02-04 | Discharge: 2015-02-04 | Disposition: A | Discharge status: Home / Self Care | Payer: Medicare Other | Attending: Emergency Medicine | Admitting: Emergency Medicine

## 2015-02-04 ENCOUNTER — Encounter (HOSPITAL_COMMUNITY): Payer: Self-pay | Admitting: Emergency Medicine

## 2015-02-04 DIAGNOSIS — Z79899 Other long term (current) drug therapy: Secondary | ICD-10-CM | POA: Insufficient documentation

## 2015-02-04 DIAGNOSIS — R55 Syncope and collapse: Secondary | ICD-10-CM | POA: Diagnosis not present

## 2015-02-04 DIAGNOSIS — F209 Schizophrenia, unspecified: Secondary | ICD-10-CM | POA: Insufficient documentation

## 2015-02-04 DIAGNOSIS — Z951 Presence of aortocoronary bypass graft: Secondary | ICD-10-CM | POA: Insufficient documentation

## 2015-02-04 DIAGNOSIS — I10 Essential (primary) hypertension: Secondary | ICD-10-CM | POA: Diagnosis not present

## 2015-02-04 DIAGNOSIS — Z9889 Other specified postprocedural states: Secondary | ICD-10-CM | POA: Diagnosis not present

## 2015-02-04 DIAGNOSIS — I252 Old myocardial infarction: Secondary | ICD-10-CM | POA: Diagnosis not present

## 2015-02-04 DIAGNOSIS — Z7982 Long term (current) use of aspirin: Secondary | ICD-10-CM | POA: Insufficient documentation

## 2015-02-04 DIAGNOSIS — E785 Hyperlipidemia, unspecified: Secondary | ICD-10-CM | POA: Insufficient documentation

## 2015-02-04 DIAGNOSIS — R42 Dizziness and giddiness: Secondary | ICD-10-CM | POA: Diagnosis not present

## 2015-02-04 DIAGNOSIS — I2510 Atherosclerotic heart disease of native coronary artery without angina pectoris: Secondary | ICD-10-CM | POA: Insufficient documentation

## 2015-02-04 DIAGNOSIS — Z87891 Personal history of nicotine dependence: Secondary | ICD-10-CM | POA: Diagnosis not present

## 2015-02-04 LAB — CBC WITH DIFFERENTIAL/PLATELET
Basophils Absolute: 0 10*3/uL (ref 0.0–0.1)
Basophils Relative: 1 %
Eosinophils Absolute: 0.1 10*3/uL (ref 0.0–0.7)
Eosinophils Relative: 2 %
HCT: 38.1 % — ABNORMAL LOW (ref 39.0–52.0)
Hemoglobin: 12.8 g/dL — ABNORMAL LOW (ref 13.0–17.0)
Lymphocytes Relative: 31 %
Lymphs Abs: 1.7 10*3/uL (ref 0.7–4.0)
MCH: 31.3 pg (ref 26.0–34.0)
MCHC: 33.6 g/dL (ref 30.0–36.0)
MCV: 93.2 fL (ref 78.0–100.0)
Monocytes Absolute: 0.3 10*3/uL (ref 0.1–1.0)
Monocytes Relative: 6 %
Neutro Abs: 3.3 10*3/uL (ref 1.7–7.7)
Neutrophils Relative %: 60 %
Platelets: 124 10*3/uL — ABNORMAL LOW (ref 150–400)
RBC: 4.09 MIL/uL — ABNORMAL LOW (ref 4.22–5.81)
RDW: 12.5 % (ref 11.5–15.5)
WBC: 5.5 10*3/uL (ref 4.0–10.5)

## 2015-02-04 LAB — BASIC METABOLIC PANEL
Anion gap: 6 (ref 5–15)
BUN: 12 mg/dL (ref 6–20)
CO2: 27 mmol/L (ref 22–32)
Calcium: 8.7 mg/dL — ABNORMAL LOW (ref 8.9–10.3)
Chloride: 103 mmol/L (ref 101–111)
Creatinine, Ser: 1.37 mg/dL — ABNORMAL HIGH (ref 0.61–1.24)
GFR calc Af Amer: 59 mL/min — ABNORMAL LOW (ref >60)
GFR calc non Af Amer: 51 mL/min — ABNORMAL LOW (ref >60)
Glucose, Bld: 122 mg/dL — ABNORMAL HIGH (ref 65–99)
Potassium: 4 mmol/L (ref 3.5–5.1)
Sodium: 136 mmol/L (ref 135–145)

## 2015-02-04 LAB — TROPONIN I: Troponin I: <0.03 ng/mL (ref <0.031)

## 2015-02-04 LAB — I-STAT TROPONIN, ED: Troponin i, poc: 0 ng/mL (ref 0.00–0.08)

## 2015-02-04 MED ORDER — LOSARTAN POTASSIUM 50 MG PO TABS: 50.0000 mg | ORAL_TABLET | Freq: Every day | ORAL | Status: DC

## 2015-02-04 MED ORDER — SODIUM CHLORIDE 0.9 % IV BOLUS (SEPSIS)
1000.0000 mL | Freq: Once | INTRAVENOUS | Status: AC
  Administered 2015-02-04: 1000 mL via INTRAVENOUS

## 2015-02-04 MED ORDER — LOSARTAN POTASSIUM 100 MG PO TABS: 50.0000 mg | ORAL_TABLET | Freq: Every day | ORAL | Status: DC

## 2015-02-04 NOTE — ED Provider Notes (Signed)
CSN: [id number]     Arrival date & time 02/04/15  7829 History   First MD Initiated Contact with Patient 02/04/15 [phone]     Chief Complaint  Patient presents with  . Loss of Consciousness     (Consider location/radiation/quality/duration/timing/severity/associated sxs/prior Treatment) Patient is a 69 y.o. male presenting with syncope.  Loss of Consciousness Episode history:  Single Timing:  Intermittent Progression:  Resolved Chronicity:  New Context: not blood draw, not bowel movement and not dehydration   Witnessed: yes   Relieved by:  None tried Worsened by:  Nothing tried Ineffective treatments:  None tried Associated symptoms: dizziness   Associated symptoms: no anxiety, no chest pain, no confusion, no difficulty breathing, no fever, no headaches, no nausea, no recent fall, no shortness of breath and no vomiting   Risk factors: coronary artery disease   Risk factors: no seizures and no vascular disease     Past Medical History  Diagnosis Date  . Coronary artery disease     CABG x4 LIMA to LAD, SV graft to RCA, sequential saphenous vein graft to diagnal and OM1  . HTN (hypertension)   . Hyperlipidemia   . Schizophrenia (HCC)   . MI (myocardial infarction) [facility])    Past Surgical History  Procedure Laterality Date  . Cardiac catheterization      EF 50%--severe two vessel obstructive atherosclerotic coronary artery disease--Low normal left ventricle funcrtion  . Coronary artery bypass graft  2007  . Hernia repair    . Tonsillectomy and adenoidectomy Bilateral y-59   Family History  Problem Relation Age of Onset  . Heart disease Mother   . Heart disease Father   . Heart failure Maternal Aunt    Social History  Substance Use Topics  . Smoking status: Former Smoker    Types: Cigarettes    Quit date: 02/03/2006  . Smokeless tobacco: Never Used  . Alcohol Use: No    Review of Systems  Constitutional: Negative for fever, chills and activity change.  HENT:  Negative for congestion, ear pain, hearing loss and rhinorrhea.   Eyes: Negative for pain and visual disturbance.  Respiratory: Negative for cough and shortness of breath.   Cardiovascular: Positive for syncope. Negative for chest pain.  Gastrointestinal: Negative for nausea, vomiting, abdominal pain, diarrhea and constipation.  Endocrine: Negative for polydipsia and polyuria.  Genitourinary: Negative for dysuria and flank pain.  Musculoskeletal: Negative for back pain and neck pain.  Skin: Negative for wound.  Neurological: Positive for dizziness. Negative for headaches.  Psychiatric/Behavioral: Negative for confusion.  All other systems reviewed and are negative.     Allergies  Haloperidol decanoate  Home Medications   Prior to Admission medications   Medication Sig Start Date End Date Taking? Authorizing Provider  aspirin (ASPIRIN EC) 81 MG EC tablet Take 81 mg by mouth daily. Swallow whole.   Yes Historical Provider, MD  atorvastatin (LIPITOR) 20 MG tablet Take 1 tablet by mouth  daily 08/26/14  Yes Cassell Clement, MD  busPIRone (BUSPAR) 5 MG tablet Take 1 tablet (5 mg total) by mouth 2 (two) times daily. 11/27/14  Yes Archer Asa, MD  hydroxypropyl methylcellulose / hypromellose (ISOPTO TEARS / GONIOVISC) 2.5 % ophthalmic solution Place 1 drop into both eyes 4 (four) times daily as needed for dry eyes.   Yes Historical Provider, MD  metoprolol tartrate (LOPRESSOR) 25 MG tablet Take 1 tablet by mouth two  times daily 08/26/14  Yes Cassell Clement, MD  Multiple Vitamin (MULTIVITAMIN) tablet Take 1  tablet by mouth daily.     Yes Historical Provider, MD  nitroGLYCERIN (NITROSTAT) 0.4 MG SL tablet ONE TABLET UNDER TONGUE AS NEEDED FOR CHEST PAIN EVERY 5 MINUTES 06/13/13  Yes Cassell Clementhomas Brackbill, MD  thioridazine (MELLARIL) 25 MG tablet Take 1 tablet (25 mg total) by mouth 2 (two) times daily. Patient taking differently: Take 50 mg by mouth at bedtime.  11/27/14  Yes Archer AsaGerald Plovsky, MD   losartan (COZAAR) 50 MG tablet Take 1 tablet (50 mg total) by mouth daily. 02/04/15   Lennart PallBrittany M Strader, PA   BP 114/91 mmHg  Pulse 59  Temp(Src) 98.3 F (36.8 C) (Oral)  Resp 13  SpO2 99% Physical Exam  Constitutional: He is oriented to person, place, and time. He appears well-developed and well-nourished.  HENT:  Head: Normocephalic and atraumatic.  Neck: Normal range of motion.  Cardiovascular: Normal rate.   Pulmonary/Chest: Effort normal. No respiratory distress.  Abdominal: He exhibits no distension.  Musculoskeletal: Normal range of motion.  Neurological: He is alert and oriented to person, place, and time. No cranial nerve deficit.  No altered mental status, able to give full seemingly accurate history.  Face is symmetric, EOM's intact, pupils equal and reactive, vision intact, tongue and uvula midline without deviation Upper and Lower extremity motor 5/5, intact pain perception in distal extremities, 2+ reflexes in biceps, patella and achilles tendons. Finger to nose normal, heel to shin normal.   Nursing note and vitals reviewed.   ED Course  Procedures (including critical care time) Labs Review Labs Reviewed  BASIC METABOLIC PANEL - Abnormal; Notable for the following:    Glucose, Bld 122 (*)    Creatinine, Ser 1.37 (*)    Calcium 8.7 (*)    GFR calc non Af Amer 51 (*)    GFR calc Af Amer 59 (*)    All other components within normal limits  CBC WITH DIFFERENTIAL/PLATELET - Abnormal; Notable for the following:    RBC 4.09 (*)    Hemoglobin 12.8 (*)    HCT 38.1 (*)    Platelets 124 (*)    All other components within normal limits  TROPONIN I  I-STAT TROPOININ, ED    Imaging Review No results found. I have personally reviewed and evaluated these images and lab results as part of my medical decision-making.   EKG Interpretation   Date/Time:  Thursday February 04 2015 09:09:47 EST Ventricular Rate:  65 PR Interval:  113 QRS Duration: 101 QT Interval:   417 QTC Calculation: 434 R Axis:   62 Text Interpretation:  Sinus rhythm Borderline short PR interval Borderline  T abnormalities, lateral leads Minimal ST elevation, anterior leads No  significant change since last tracing Confirmed by [facility] MD, Barbara CowerJASON  [phone]([id number]) on 02/04/2015 9:16:17 AM      MDM   Final diagnoses:  Syncope, unspecified syncope type   Patient took a nitroglycerin tablet approximately 15 to his E had a 32nd episode of syncope preceded by dizziness. Denies any chest pain, shortness of breath or other associated symptoms. On exam here his neuro exam is normal. EKG without any evidence of abnormalities. Does have a history of coronary artery disease, low normal ejection fraction and is 69 years old so is high risk for arrhythmias. We'll check his labs and discussed case with cardiology and medicine for admission versus discharge.  Labs ok. Will d/w cardiology re admission v discharge  Cardiology saw the patient and feel it is unlikely to be cardiac in nature which was  my main concern. They will see the patient in follow-up on the 14th to get an event monitor. Patient asymptomatic while in the emergency department and not syncopal on standing. Feel this is appropriate plan patient be discharged to the care of his facility.  I have personally and contemperaneously reviewed labs and imaging and used in my decision making as above.   A medical screening exam was performed and I feel the patient has had an appropriate workup for their chief complaint at this time and likelihood of emergent condition existing is low. They have been counseled on decision, discharge, follow up and which symptoms necessitate immediate return to the emergency department. They or their family verbally stated understanding and agreement with plan and discharged in stable condition.      Marily Memos, MD 02/04/15 [phone]

## 2015-02-04 NOTE — ED Notes (Signed)
Per EMS- Pt was at an exercise program when he had a syncopal episode and slumped over in his chair for about 1 minute. Pt denied pain before or after the incident. BP noted to be in the 90s and pt was pale upon EMS arrival. CBG 123. Pt given Bolus.

## 2015-02-04 NOTE — Consult Note (Signed)
CARDIOLOGY CONSULT NOTE   Patient ID: Gilbert Harris Broyles MRN: [id number], DOB/AGE: April 07, 1945   Admit date: 02/04/2015 Date of Consult: 02/04/2015 Reason for  Consult: Syncope   Primary Physician: Altha HarmMATTHEWS,MICHELLE A., MD Primary Cardiologist: Dr. Patty SermonsBrackbill  HPI: Gilbert Harris Carbary is a 69 y.o. male with past medical history of CAD (s/p CABG 2007 with LIMA-LAD, SVG-RCA, SVG-D and OM1), HTN, HLD, and Schizophrenia who [facility] to [facility]2015 following a syncopal event this morning.   The patient reports he has had decreased energy for the past four days and has wanted to rest more than usual. This morning, while riding in the [place] to exercise class, he had a syncopal event which was witnessed and lasted for 30 seconds. He was sitting in the passenger's seat and slumped over. No seizure activity was witnessed. No urine or stool incontinence. He does report a prodrome of dizziness and what he describes as "confusion". He had been sitting in the car for over 10 minutes at the time of the event. He denies any previous syncopal events. He reports having breakfast this morning and consuming a glass of milk with his meal.    Denies any recent chest pain, palpitations, dyspnea, orthopnea, PND, or edema.   While in the ED, his initial troponin has been negative. Hgb at 12.8. Creatinine at 1.37. EKG shows sinus rhythm with rate in 60's and no ischemic changes. BP has been 94/69 - 101/72 while in the ED. He received a bolus of IV fluids while in the ED and reports feeling well at this time. Denies any recurrence in his symptoms while in the ED.  Orthostatic Vitals were checked and are as follows: Lying:          BP: 98/77   P: 57 Sitting:        BP: 101/72 P: 67 Standing:    BP: 100/74 P: 70   He was last seen in the office by Dr. Patty SermonsBrackbill on 01/25/2015 and doing well at that time. He did reporting having an episode of low BP while at an exercise that morning and Brackbill thought it was likely  related to mild dehydration due to the patient having skipped breakfast that AM and not having anything to drink with exercise. His BP was 120/70 at that visit.   Was seen by Internal Medicine the week prior and had a BP of 95/65 but denies any symptoms around that time.  Last stress test was in 2012 and EF was 61% at that time. No recent echo available for review.  He currently resides at a group home and has been there for 20+ years. Goes to an exercise class 3-4 times per week. Quit smoking in 2007 following his CABG procedure. Denies any recreational drug use or alcohol use.    Problem List Past Medical History  Diagnosis Date  . Coronary artery disease     CABG x4 LIMA to LAD, SV graft to RCA, sequential saphenous vein graft to diagnal and OM1  . HTN (hypertension)   . Hyperlipidemia   . Schizophrenia (HCC)   . MI (myocardial infarction) [facility](HCC)     Past Surgical History  Procedure Laterality Date  . Cardiac catheterization      EF 50%--severe two vessel obstructive atherosclerotic coronary artery disease--Low normal left ventricle funcrtion  . Coronary artery bypass graft  2007  . Hernia repair    . Tonsillectomy and adenoidectomy Bilateral y-59     Allergies Allergies  Allergen Reactions  .  Haloperidol Decanoate Shortness Of Breath      Inpatient Medications    Family History Family History  Problem Relation Age of Onset  . Heart disease Mother   . Heart disease Father   . Heart failure Maternal Aunt      Social History Social History   Social History  . Marital Status: Single    Spouse Name: N/A  . Number of Children: N/A  . Years of Education: N/A   Occupational History  . Not on file.   Social History Main Topics  . Smoking status: Former Smoker    Types: Cigarettes    Quit date: 02/03/2006  . Smokeless tobacco: Never Used  . Alcohol Use: No  . Drug Use: No  . Sexual Activity: Not Currently   Other Topics Concern  . Not on file   Social  History Narrative  . No narrative on file     Review of Systems General:  No chills, fever, night sweats or weight changes. Positive for generalized fatigue. Cardiovascular:  No chest pain, dyspnea on exertion, edema, orthopnea, palpitations, paroxysmal nocturnal dyspnea. Dermatological: No rash, lesions/masses Respiratory: No cough, dyspnea Urologic: No hematuria, dysuria Abdominal:   No nausea, vomiting, diarrhea, bright red blood per rectum, melena, or hematemesis Neurologic:  No visual changes, wkns, changes in mental status. Positive for syncopal event and dizziness. All other systems reviewed and are otherwise negative except as noted above.  Physical Exam Blood pressure 101/72, pulse 61, temperature 97.6 F (36.4 C), resp. rate 20, SpO2 100 %.  General: Pleasant, African American male in NAD Psych: Normal affect. Neuro: Alert and oriented X 3. Moves all extremities spontaneously. HEENT: Normal  Neck: Supple without bruits or JVD. Lungs:  Resp regular and unlabored, CTA without wheezing or rales. Heart: RRR no s3, s4, or murmurs. Abdomen: Soft, non-tender, non-distended, BS + x 4.  Extremities: No clubbing, cyanosis or edema. DP/PT/Radials 2+ and equal bilaterally.  Labs   Recent Labs  02/04/15 0954  TROPONINI <0.03   Lab Results  Component Value Date   WBC 5.5 02/04/2015   HGB 12.8* 02/04/2015   HCT 38.1* 02/04/2015   MCV 93.2 02/04/2015   PLT 124* 02/04/2015     Recent Labs Lab 02/04/15 0911  NA 136  K 4.0  CL 103  CO2 27  BUN 12  CREATININE 1.37*  CALCIUM 8.7*  GLUCOSE 122*   Lab Results  Component Value Date   CHOL 126 01/25/2015   HDL 56 01/25/2015   LDLCALC 51 01/25/2015   TRIG 96 01/25/2015    Radiology/Studies  No results found.  ECG: NSR with rate in 60's. No acute changes since previous tracing.  ECHOCARDIOGRAM: None on File   ASSESSMENT AND PLAN  1. Syncopal Event of Unknown Etiology - witnessed syncopal event lasting 30  seconds. Prodrome of dizziness. No witnessed seizure activity or bladder/stool incontinence. - orthostatic vitals checked and negative for orthostatic hypotension. - has been having systolic BP in the 90's - 110's. Reports improvement in his symptoms following IVF bolus. - will decrease Losartan from  daily to  daily. - will arrange for the patient to have an event monitor as outpatient. Follow-up with Dr. Patty Sermons after event monitor.   Signed, Ellsworth Lennox, PA-C 02/04/2015, 11:31 AM Pager: [phone]   Agree with note by Reita May   Asked to evaluate pt with witnessed syncope. Cardiology patient of Dr. Patty Sermons. H/O remote CABG. Pt lost consciousness in car (passinger seat). Was out for 30  seconds. No seizure activity. Exam benign. Labs OK. Orthostatic VS nl. EKG w/o acute findings. OK for DC home. Meds adjusted. 30 day event monitor the ROV with Dr. Estill Batten, M.D., FACP, [facility], Kathryne Eriksson [facility] [address] [place], [place]  [address]  [phone] 02/04/2015 1:19 PM

## 2015-02-08 ENCOUNTER — Ambulatory Visit (INDEPENDENT_AMBULATORY_CARE_PROVIDER_SITE_OTHER): Payer: Medicare Other

## 2015-02-08 DIAGNOSIS — R55 Syncope and collapse: Secondary | ICD-10-CM

## 2015-02-21 ENCOUNTER — Other Ambulatory Visit: Payer: Self-pay | Admitting: Cardiology

## 2015-03-09 IMAGING — US US ABDOMEN COMPLETE
1 series · 14 of 25 positions shown · non-contrast
Comparison: None.

CLINICAL DATA: Pain.

EXAM:
ULTRASOUND ABDOMEN COMPLETE

[Series 1: us abdomen complete · 0.21mm/px · 14 of 94 slices shown]
[im 1/94]
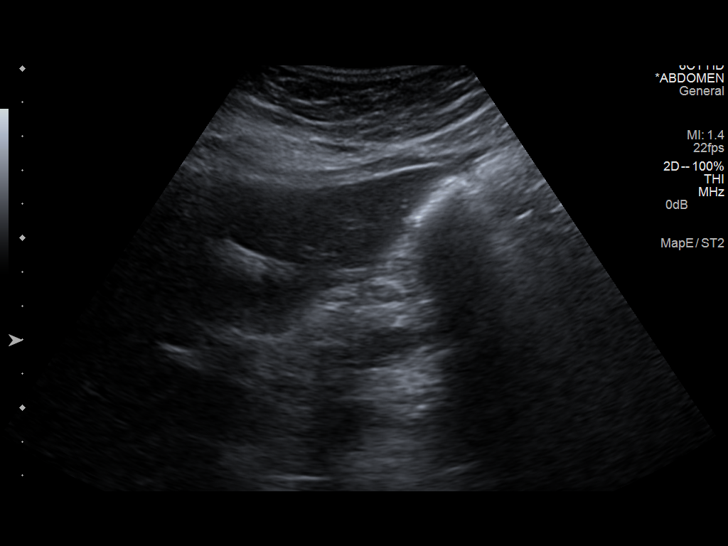
[im 8/94]
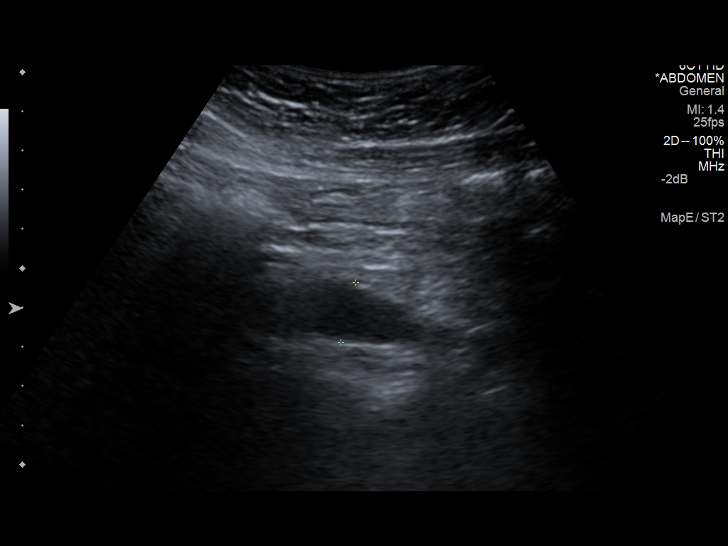
[im 16/94]
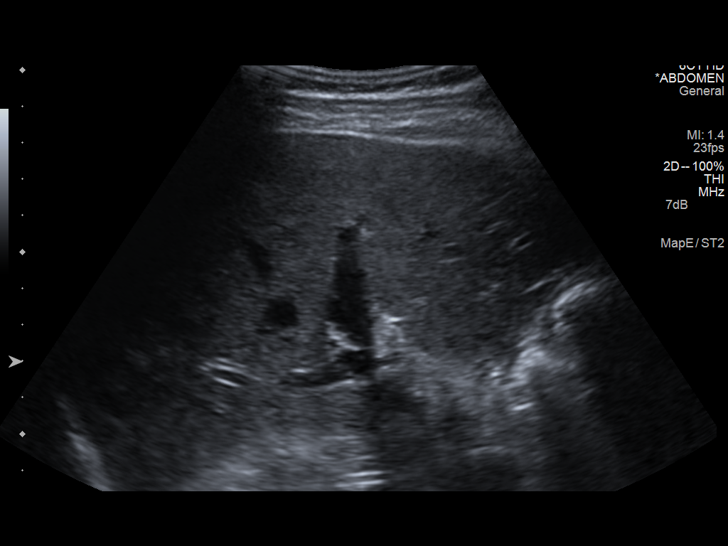
[im 24/94]
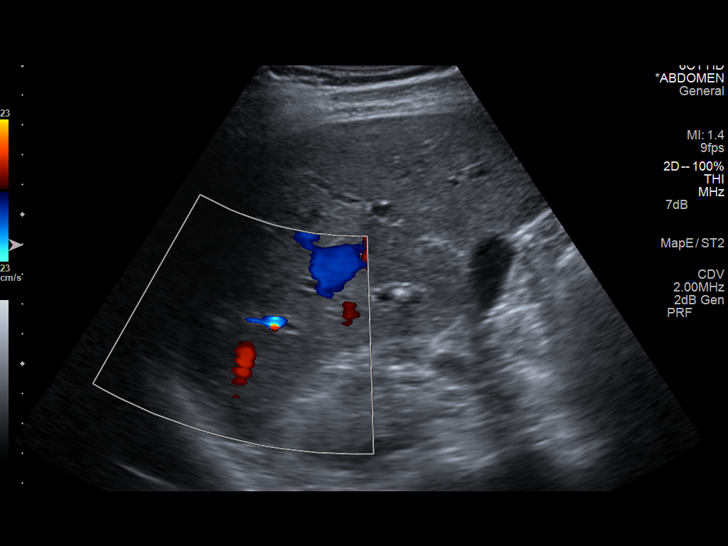
[im 32/94]
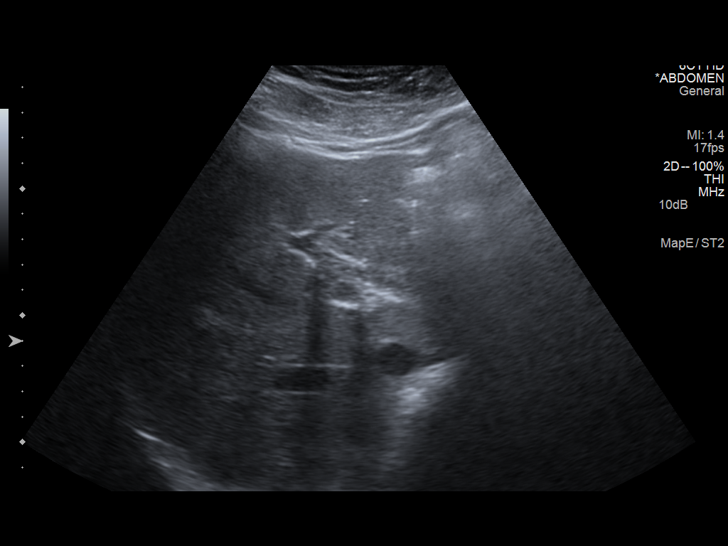
[im 35/94]
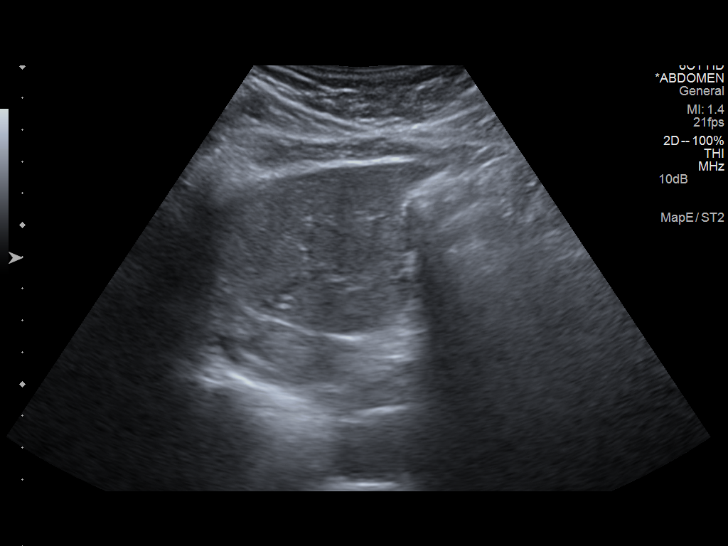
[im 43/94]
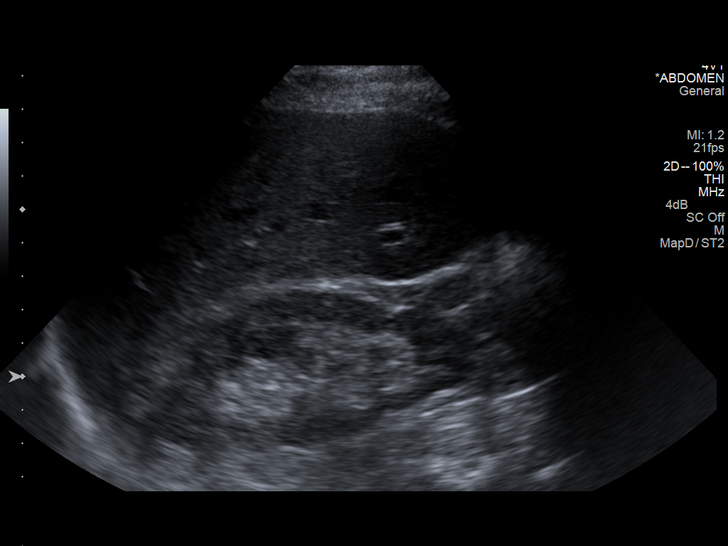
[im 51/94]
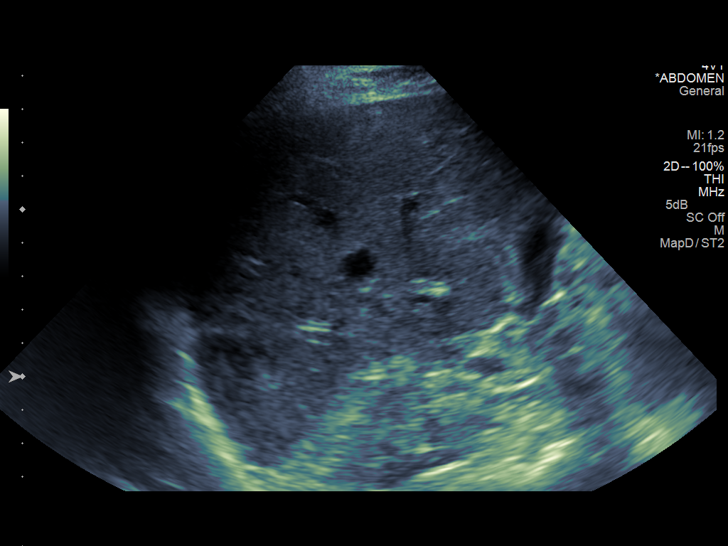
[im 59/94]
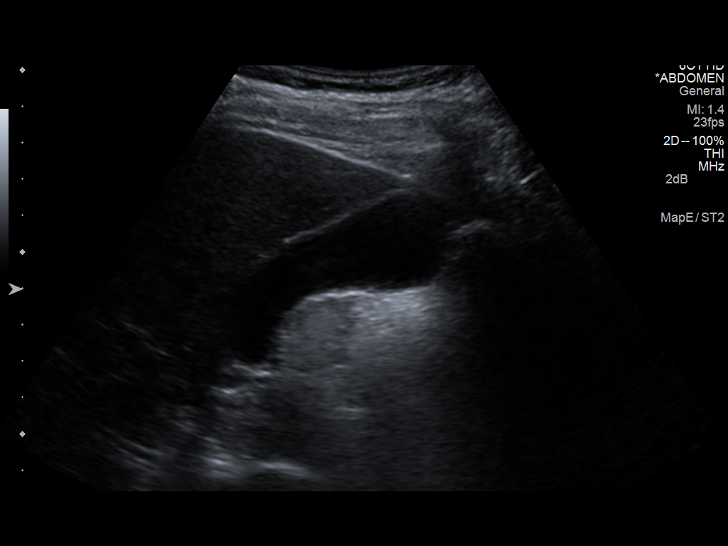
[im 63/94]
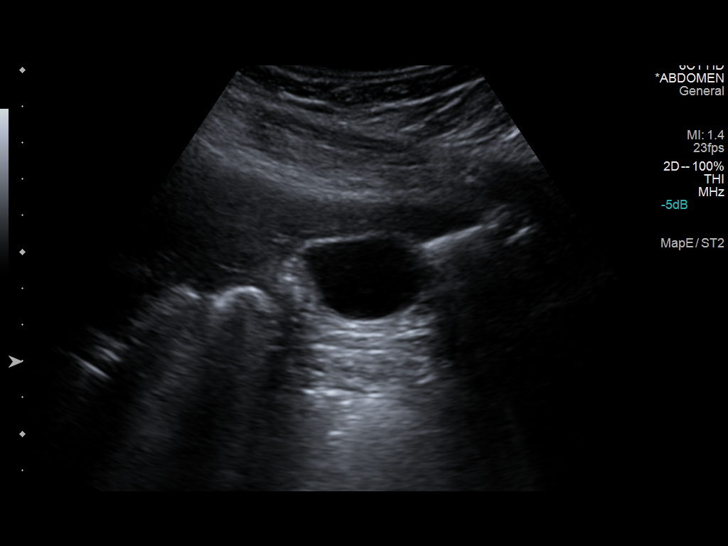
[im 70/94]
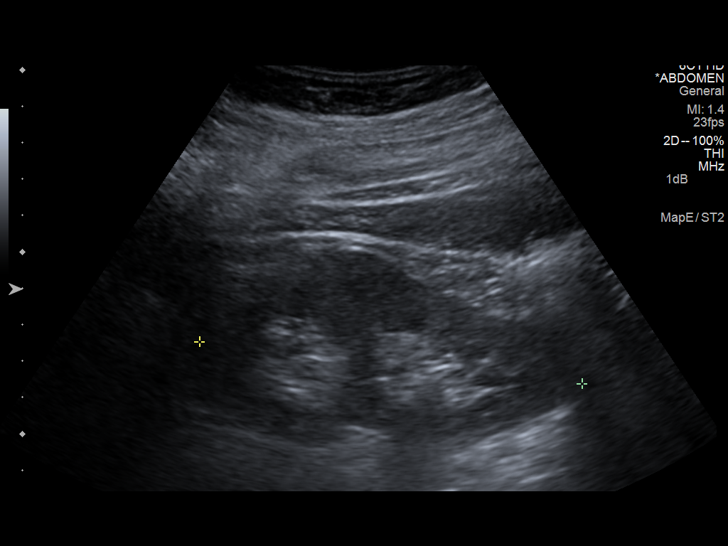
[im 78/94]
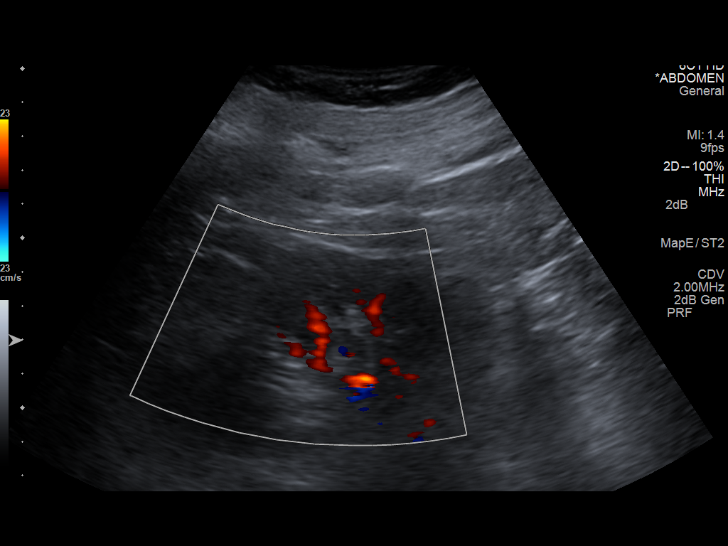
[im 86/94]
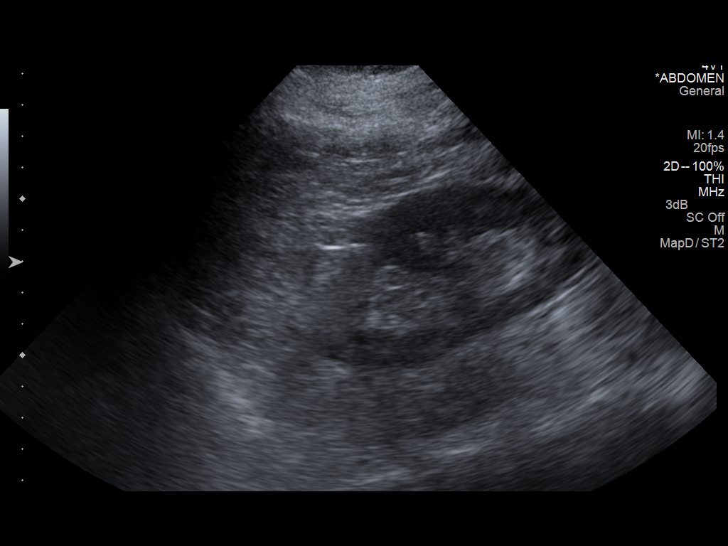
[im 94/94]
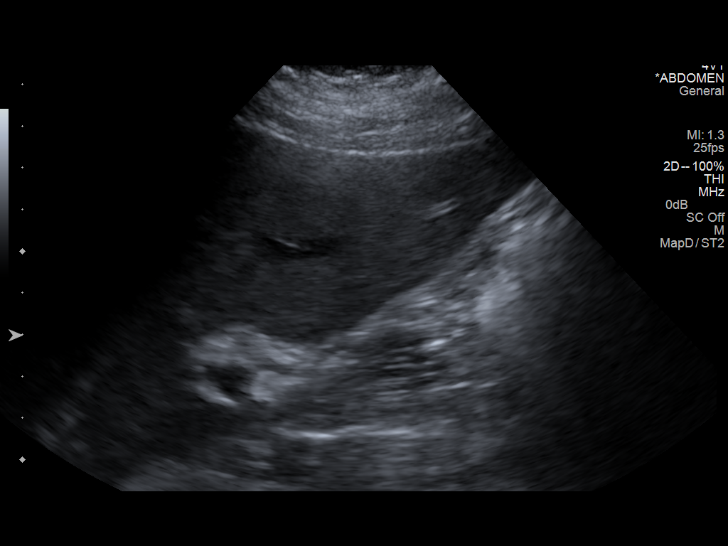

[14 of 25 positions shown; findings below may reference images not displayed]

FINDINGS: Gallbladder:

No gallstones or wall thickening visualized. No sonographic Murphy
sign noted.

Common bile duct:

Diameter: 3.3 mm.

Liver:

2.5 x 2.3 x 2.0 cm and adjacent 1.4 x 1.3 x 2.0 cm hypoechoic
lesions in the right lobe of the liver. MRI the liver with contrast
suggested for further evaluation.

IVC:

No abnormality visualized.

Pancreas:

Visualized portion unremarkable.

Spleen:

Size and appearance within normal limits.

Right Kidney:

Length: 10.2 cm. Echogenicity within normal limits. No mass or
hydronephrosis visualized.

Left Kidney:

Length: 10.6 cm.. Echogenicity within normal limits. No mass or
hydronephrosis visualized.

Abdominal aorta:

No aneurysm visualized.

Other findings:

None.
IMPRESSION: 1. 2.5 x 2.3 x 2.2 cm and adjacent 1.4 x 1.3 x 2.0 cm hypoechoic
lesions right lobe of the liver. MRI of the liver with contrast is
suggested for further evaluation.

2.  Gallbladder and biliary system appear normal.

## 2015-03-26 ENCOUNTER — Encounter (HOSPITAL_COMMUNITY): Payer: Self-pay | Admitting: Psychiatry

## 2015-03-26 ENCOUNTER — Ambulatory Visit (INDEPENDENT_AMBULATORY_CARE_PROVIDER_SITE_OTHER): Payer: Medicare Other | Admitting: Psychiatry

## 2015-03-26 ENCOUNTER — Ambulatory Visit (HOSPITAL_COMMUNITY): Payer: Self-pay | Admitting: Psychiatry

## 2015-03-26 VITALS — BP 128/88 | HR 65 | Ht 72.0 in | Wt 207.2 lb

## 2015-03-26 DIAGNOSIS — F200 Paranoid schizophrenia: Secondary | ICD-10-CM | POA: Diagnosis not present

## 2015-03-26 MED ORDER — BUSPIRONE HCL 5 MG PO TABS: 5.0000 mg | ORAL_TABLET | Freq: Two times a day (BID) | ORAL | Status: DC

## 2015-03-26 MED ORDER — THIORIDAZINE HCL 25 MG PO TABS: 25.0000 mg | ORAL_TABLET | Freq: Two times a day (BID) | ORAL | Status: DC

## 2015-03-26 NOTE — Progress Notes (Signed)
[facility] MD Progress Note  03/26/2015 10:40 AM Gilbert Harris  MRN:  [id number] Subjective: Doing well Principal Problem: Schizophrenia residual Diagnosis:  Schizophrenia residual At this time the patient is doing very well. He is very stable. He lives in a group setting and is very independent. He is taking are classes. Is very well dressed. Is very focused and organized. The patient denies being depressed. He denies any symptoms of a particular anxiety disorder. He denies the use of alcohol or drugs. He is generally happy with life. He denies auditory or visual hallucinations. He denies any delusional material. The patient is medically stable. He has significant heart disease and had bypass surgery years ago. He denies any chest pain or shortness of breath. He is followed closely by his cardiologist. Patient is very compliant with my medications. He is not oversedated. He's very active. He shows few negative symptoms. He seems to engage pretty well in his environment. The patient is thoughts are appropriate his emotions are appropriate. Patient Active Problem List   Diagnosis Date Noted  . Prediabetes [R73.03] 01/14/2015  . Chronic kidney disease (CKD) stage G2/A3, mildly decreased glomerular filtration rate (GFR) between 60-89 mL/min/1.73 square meter and albuminuria creatinine ratio greater than 300 mg/g [N18.2] 02/12/2014  . Thrombocytopenia (HCC) [D69.6] 07/10/2013  . HTN (hypertension) [I10] 06/09/2013  . Annual physical exam [Z00.00] 06/09/2013  . Hyperglycemia [R73.9] 06/09/2013  . Benign hypertensive heart disease without heart failure [I11.9] 07/14/2011  . HYPERCHOLESTEROLEMIA [E78.00] 08/13/2008  . Essential hypertension [I10] 08/13/2008  . Coronary atherosclerosis [I25.10] 08/13/2008   Total Time spent with patient: 30 minutes  Past Psychiatric History:   Past Medical History:  Past Medical History  Diagnosis Date  . Coronary artery disease     CABG x4 LIMA to LAD, SV graft to  RCA, sequential saphenous vein graft to diagnal and OM1  . HTN (hypertension)   . Hyperlipidemia   . Schizophrenia (HCC)   . MI (myocardial infarction) [facility])     Past Surgical History  Procedure Laterality Date  . Cardiac catheterization      EF 50%--severe two vessel obstructive atherosclerotic coronary artery disease--Low normal left ventricle funcrtion  . Coronary artery bypass graft  2007  . Hernia repair    . Tonsillectomy and adenoidectomy Bilateral y-59   Family History:  Family History  Problem Relation Age of Onset  . Heart disease Mother   . Heart disease Father   . Heart failure Maternal Aunt    Family Psychiatric  History:  Social History:  History  Alcohol Use No     History  Drug Use No    Social History   Social History  . Marital Status: Single    Spouse Name: N/A  . Number of Children: N/A  . Years of Education: N/A   Social History Main Topics  . Smoking status: Former Smoker    Types: Cigarettes    Quit date: 02/03/2006  . Smokeless tobacco: Never Used  . Alcohol Use: No  . Drug Use: No  . Sexual Activity: Not Currently   Other Topics Concern  . None   Social History Narrative   Additional Social History:                         Sleep: Good  Appetite:  Good  Current Medications: Current Outpatient Prescriptions  Medication Sig Dispense Refill  . aspirin (ASPIRIN EC) 81 MG EC tablet Take 81 mg by mouth daily.  Swallow whole.    Marland Kitchen. atorvastatin (LIPITOR) 20 MG tablet Take 1 tablet by mouth  daily 90 tablet 2  . busPIRone (BUSPAR) 5 MG tablet Take 1 tablet (5 mg total) by mouth 2 (two) times daily. 60 tablet 6  . hydroxypropyl methylcellulose / hypromellose (ISOPTO TEARS / GONIOVISC) 2.5 % ophthalmic solution Place 1 drop into both eyes 4 (four) times daily as needed for dry eyes.    Marland Kitchen. losartan (COZAAR) 50 MG tablet Take 1 tablet (50 mg total) by mouth daily. 90 tablet 3  . metoprolol tartrate (LOPRESSOR) 25 MG tablet Take 1  tablet by mouth two  times daily 180 tablet 1  . Multiple Vitamin (MULTIVITAMIN) tablet Take 1 tablet by mouth daily.      . nitroGLYCERIN (NITROSTAT) 0.4 MG SL tablet ONE TABLET UNDER TONGUE AS NEEDED FOR CHEST PAIN EVERY 5 MINUTES 25 tablet 2  . thioridazine (MELLARIL) 25 MG tablet Take 1 tablet (25 mg total) by mouth 2 (two) times daily. 60 tablet 7  . thioridazine (MELLARIL) 25 MG tablet Take 1 tablet (25 mg total) by mouth 2 (two) times daily. 60 tablet 6   No current facility-administered medications for this visit.    Lab Results: No results found for this or any previous visit (from the past 48 hour(s)).  Physical Findings: AIMS:  , ,  ,  ,    CIWA:    COWS:     Musculoskeletal: Strength & Muscle Tone: within normal limits Gait & Station: normal Patient leans:  Psychiatric Specialty Exam: ROS  Blood pressure 128/88, pulse 65, height 6' (1.829 m), weight 207 lb 3.2 oz (93.985 kg).Body mass index is 28.1 kg/(m^2).  General Appearance: Meticulous  Eye Contact::  Good  Speech:  Clear and Coherent  Volume:  Normal  Mood:  Euthymic  Affect:  Appropriate  Thought Process:  Coherent  Orientation:  Full (Time, Place, and Person)  Thought Content:  WDL  Suicidal Thoughts:  No  Homicidal Thoughts:  No  Memory:  NA  Judgement:  NA  Insight:  Good  Psychomotor Activity:  Normal  Concentration:  Good  Recall:  Good  Fund of Knowledge:Good  Language: Good  Akathisia:  No  Handed:  Right  AIMS (if indicated):     Assets:  Communication Skills  ADL's:  Intact  Cognition: WNL  Sleep:      Treatment Plan Summary: At this time this patient is doing very well. He was psychiatrically hospitalized 40 years ago. He is very stable. He takes low-dose Mellaril 25 mg twice a day shows no side effects from it. The patient also takes BuSpar 5 mg twice a day for anxiety. Both of these medicines seem to be working well. So his first problem is that of her chronic psychotic disorder which  is well controlled with Mellaril. The patient shows no evidence of tardive dyskinesia. The patient is closely followed by medical team. The patient is not suicidal not psychotic and functioning extremely well. The patient will return to see me in 5 months.  Shaquon Gropp IRVING 03/26/2015, 10:40 AM

## 2015-04-11 ENCOUNTER — Other Ambulatory Visit: Payer: Self-pay | Admitting: Cardiology

## 2015-04-19 ENCOUNTER — Other Ambulatory Visit: Payer: Self-pay | Admitting: *Deleted

## 2015-04-19 MED ORDER — LOSARTAN POTASSIUM 50 MG PO TABS: 50.0000 mg | ORAL_TABLET | Freq: Every day | ORAL | Status: DC

## 2015-04-22 ENCOUNTER — Ambulatory Visit (HOSPITAL_COMMUNITY): Payer: Self-pay | Admitting: Psychiatry

## 2015-04-22 ENCOUNTER — Other Ambulatory Visit: Payer: Self-pay | Admitting: Cardiology

## 2015-07-15 ENCOUNTER — Encounter: Payer: Self-pay | Admitting: Family Medicine

## 2015-07-15 ENCOUNTER — Ambulatory Visit (INDEPENDENT_AMBULATORY_CARE_PROVIDER_SITE_OTHER): Payer: Medicare Other | Admitting: Family Medicine

## 2015-07-15 VITALS — BP 131/85 | HR 64 | Temp 97.7°F | Ht 72.0 in | Wt 210.0 lb

## 2015-07-15 DIAGNOSIS — I10 Essential (primary) hypertension: Secondary | ICD-10-CM

## 2015-07-15 DIAGNOSIS — R7303 Prediabetes: Secondary | ICD-10-CM

## 2015-07-15 DIAGNOSIS — N182 Chronic kidney disease, stage 2 (mild): Secondary | ICD-10-CM

## 2015-07-15 LAB — COMPLETE METABOLIC PANEL WITH GFR
ALT: 28 U/L (ref 9–46)
AST: 28 U/L (ref 10–35)
Albumin: 4.4 g/dL (ref 3.6–5.1)
Alkaline Phosphatase: 63 U/L (ref 40–115)
BUN: 14 mg/dL (ref 7–25)
CO2: 28 mmol/L (ref 20–31)
Calcium: 10.1 mg/dL (ref 8.6–10.3)
Chloride: 99 mmol/L (ref 98–110)
Creat: 1.35 mg/dL — ABNORMAL HIGH (ref 0.70–1.25)
GFR, Est African American: 61 mL/min (ref >=60)
GFR, Est Non African American: 53 mL/min — ABNORMAL LOW (ref >=60)
Glucose, Bld: 81 mg/dL (ref 65–99)
Potassium: 4.5 mmol/L (ref 3.5–5.3)
Sodium: 137 mmol/L (ref 135–146)
Total Bilirubin: 0.8 mg/dL (ref 0.2–1.2)
Total Protein: 7.3 g/dL (ref 6.1–8.1)

## 2015-07-15 LAB — HEMOGLOBIN A1C
Hgb A1c MFr Bld: 6.1 % — ABNORMAL HIGH (ref <5.7)
Mean Plasma Glucose: 128 mg/dL

## 2015-07-15 NOTE — Patient Instructions (Signed)
DASH Eating Plan  DASH stands for "Dietary Approaches to Stop Hypertension." The DASH eating plan is a healthy eating plan that has been shown to reduce high blood pressure (hypertension). Additional health benefits may include reducing the risk of type 2 diabetes mellitus, heart disease, and stroke. The DASH eating plan may also help with weight loss.  WHAT DO I NEED TO KNOW ABOUT THE DASH EATING PLAN?  For the DASH eating plan, you will follow these general guidelines:  · Choose foods with a percent daily value for sodium of less than 5% (as listed on the food label).  · Use salt-free seasonings or herbs instead of table salt or sea salt.  · Check with your health care provider or pharmacist before using salt substitutes.  · Eat lower-sodium products, often labeled as "lower sodium" or "no salt added."  · Eat fresh foods.  · Eat more vegetables, fruits, and low-fat dairy products.  · Choose whole grains. Look for the word "whole" as the first word in the ingredient list.  · Choose fish and skinless chicken or turkey more often than red meat. Limit fish, poultry, and meat to 6 oz (170 g) each day.  · Limit sweets, desserts, sugars, and sugary drinks.  · Choose heart-healthy fats.  · Limit cheese to 1 oz (28 g) per day.  · Eat more home-cooked food and less restaurant, buffet, and fast food.  · Limit fried foods.  · Cook foods using methods other than frying.  · Limit canned vegetables. If you do use them, rinse them well to decrease the sodium.  · When eating at a restaurant, ask that your food be prepared with less salt, or no salt if possible.  WHAT FOODS CAN I EAT?  Seek help from a dietitian for individual calorie needs.  Grains  Whole grain or whole wheat bread. Brown rice. Whole grain or whole wheat pasta. Quinoa, bulgur, and whole grain cereals. Low-sodium cereals. Corn or whole wheat flour tortillas. Whole grain cornbread. Whole grain crackers. Low-sodium crackers.  Vegetables  Fresh or frozen vegetables  (raw, steamed, roasted, or grilled). Low-sodium or reduced-sodium tomato and vegetable juices. Low-sodium or reduced-sodium tomato sauce and paste. Low-sodium or reduced-sodium canned vegetables.   Fruits  All fresh, canned (in natural juice), or frozen fruits.  Meat and Other Protein Products  Ground beef (85% or leaner), grass-fed beef, or beef trimmed of fat. Skinless chicken or turkey. Ground chicken or turkey. Pork trimmed of fat. All fish and seafood. Eggs. Dried beans, peas, or lentils. Unsalted nuts and seeds. Unsalted canned beans.  Dairy  Low-fat dairy products, such as skim or 1% milk, 2% or reduced-fat cheeses, low-fat ricotta or cottage cheese, or plain low-fat yogurt. Low-sodium or reduced-sodium cheeses.  Fats and Oils  Tub margarines without trans fats. Light or reduced-fat mayonnaise and salad dressings (reduced sodium). Avocado. Safflower, olive, or canola oils. Natural peanut or almond butter.  Other  Unsalted popcorn and pretzels.  The items listed above may not be a complete list of recommended foods or beverages. Contact your dietitian for more options.  WHAT FOODS ARE NOT RECOMMENDED?  Grains  White bread. White pasta. White rice. Refined cornbread. Bagels and croissants. Crackers that contain trans fat.  Vegetables  Creamed or fried vegetables. Vegetables in a cheese sauce. Regular canned vegetables. Regular canned tomato sauce and paste. Regular tomato and vegetable juices.  Fruits  Dried fruits. Canned fruit in light or heavy syrup. Fruit juice.  Meat and Other Protein   Products  Fatty cuts of meat. Ribs, chicken wings, bacon, sausage, bologna, salami, chitterlings, fatback, hot dogs, bratwurst, and packaged luncheon meats. Salted nuts and seeds. Canned beans with salt.  Dairy  Whole or 2% milk, cream, half-and-half, and cream cheese. Whole-fat or sweetened yogurt. Full-fat cheeses or blue cheese. Nondairy creamers and whipped toppings. Processed cheese, cheese spreads, or cheese  curds.  Condiments  Onion and garlic salt, seasoned salt, table salt, and sea salt. Canned and packaged gravies. Worcestershire sauce. Tartar sauce. Barbecue sauce. Teriyaki sauce. Soy sauce, including reduced sodium. Steak sauce. Fish sauce. Oyster sauce. Cocktail sauce. Horseradish. Ketchup and mustard. Meat flavorings and tenderizers. Bouillon cubes. Hot sauce. Tabasco sauce. Marinades. Taco seasonings. Relishes.  Fats and Oils  Butter, stick margarine, lard, shortening, ghee, and bacon fat. Coconut, palm kernel, or palm oils. Regular salad dressings.  Other  Pickles and olives. Salted popcorn and pretzels.  The items listed above may not be a complete list of foods and beverages to avoid. Contact your dietitian for more information.  WHERE CAN I FIND MORE INFORMATION?  National Heart, Lung, and Blood Institute: www.nhlbi.nih.gov/health/health-topics/topics/dash/     This information is not intended to replace advice given to you by your health care provider. Make sure you discuss any questions you have with your health care provider.     Document Released: 03/02/2011 Document Revised: 04/03/2014 Document Reviewed: 01/15/2013  Elsevier Interactive Patient Education ©2016 Elsevier Inc.

## 2015-07-15 NOTE — Progress Notes (Signed)
Subjective:    Patient ID: Gilbert Harris, male    DOB: [birth year], 69 y.o.   MRN: [id number] Gilbert Harris, a 70 year old patient with hypertension and hyperlipidemia presents for a 6 month follow up.  Patient states that he was seen by cardiologist and had an EKG performed 6 months ago. Reports that he is taking medications consistently and is feeling good. Reports that he has changed diet and started a regular exercise routine.He states that he goes to the [facility] for an exercise program several times per week.  Patient currently denies shortness of breath, dizziness, edema, claudication, chest pains, or nausea.   Hypertension This is a chronic problem. The problem is controlled. Pertinent negatives include no blurred vision, chest pain, headaches, malaise/fatigue, neck pain, orthopnea, palpitations, peripheral edema, PND, shortness of breath or sweats. Risk factors for coronary artery disease include male gender. The current treatment provides moderate improvement. There are no compliance problems.  There is no history of angina, kidney disease, CAD/MI, CVA, heart failure, left ventricular hypertrophy or retinopathy.  Hyperlipidemia This is a chronic problem. The problem is controlled. Recent lipid tests were reviewed and are normal. Associated symptoms include leg pain. Pertinent negatives include no chest pain, focal sensory loss, focal weakness, myalgias or shortness of breath. Current antihyperlipidemic treatment includes statins. The current treatment provides significant improvement of lipids. There are no compliance problems.  There are no known risk factors for coronary artery disease.    Immunization History  Administered Date(s) Administered  . Influenza Whole 12/22/2010  . Influenza,inj,Quad PF,36+ Mos 01/14/2015  . Pneumococcal Polysaccharide-23 02/12/2014  . Tdap 07/10/2013  . Zoster 02/12/2014   Past Medical History  Diagnosis Date  . Coronary artery disease     CABG x4 LIMA  to LAD, SV graft to RCA, sequential saphenous vein graft to diagnal and OM1  . HTN (hypertension)   . Hyperlipidemia   . Schizophrenia (HCC)   . MI (myocardial infarction) [facility])      Medication List       This list is accurate as of: 07/15/15 10:13 AM.  Always use your most recent med list.               aspirin EC 81 MG EC tablet  Generic drug:  aspirin  Take 81 mg by mouth daily. Swallow whole.     atorvastatin 20 MG tablet  Commonly known as:  LIPITOR  Take 1 tablet by mouth  daily     busPIRone 5 MG tablet  Commonly known as:  BUSPAR  Take 1 tablet (5 mg total) by mouth 2 (two) times daily.     hydroxypropyl methylcellulose / hypromellose 2.5 % ophthalmic solution  Commonly known as:  ISOPTO TEARS / GONIOVISC  Place 1 drop into both eyes 4 (four) times daily as needed for dry eyes.     losartan 50 MG tablet  Commonly known as:  COZAAR  Take 1 tablet (50 mg total) by mouth daily.     metoprolol tartrate 25 MG tablet  Commonly known as:  LOPRESSOR  Take 1 tablet by mouth two  times daily     multivitamin tablet  Take 1 tablet by mouth daily.     NITROSTAT 0.4 MG SL tablet  Generic drug:  nitroGLYCERIN  Dissolve 1 tablet under the tongue as needed for chest  pain every 5 minutes     thioridazine 25 MG tablet  Commonly known as:  MELLARIL  Take 1 tablet (25  mg total) by mouth 2 (two) times daily.       Review of Systems  Constitutional: Negative.  Negative for fever, malaise/fatigue, fatigue and unexpected weight change.  HENT: Negative.   Eyes: Negative.  Negative for blurred vision.  Respiratory: Negative.  Negative for shortness of breath.   Cardiovascular: Negative.  Negative for chest pain, palpitations, orthopnea and PND.  Gastrointestinal: Negative.  Negative for nausea, diarrhea and rectal pain.  Endocrine: Negative.  Negative for polydipsia, polyphagia and polyuria.  Genitourinary: Negative.   Musculoskeletal: Negative.  Negative for myalgias and  neck pain.  Skin: Negative.   Allergic/Immunologic: Negative.   Neurological: Negative for dizziness, focal weakness, syncope, weakness and headaches.  Hematological: Negative.   Psychiatric/Behavioral: Negative.    BP 131/85 mmHg  Pulse 64  Temp(Src) 97.7 F (36.5 C) (Oral)  Ht 6' (1.829 m)  Wt 210 lb (95.255 kg)  BMI 28.47 kg/m2  SpO2 100%    Objective:   Physical Exam  Constitutional: He is oriented to person, place, and time. He appears well-developed and well-nourished.  HENT:  Head: Normocephalic and atraumatic.  Eyes: Pupils are equal, round, and reactive to light.  Neck: Normal range of motion. Neck supple.  Cardiovascular: Normal rate, regular rhythm and normal heart sounds.   Pulmonary/Chest: Effort normal and breath sounds normal.  Abdominal: Soft. Bowel sounds are normal.  Musculoskeletal: Normal range of motion.  Neurological: He is alert and oriented to person, place, and time.  Skin: Skin is warm and dry.  Psychiatric: He has a normal mood and affect. His behavior is normal. Judgment and thought content normal.    BP 131/85 mmHg  Pulse 64  Temp(Src) 97.7 F (36.5 C) (Oral)  Ht 6' (1.829 m)  Wt 210 lb (95.255 kg)  BMI 28.47 kg/m2  SpO2 100%     Assessment & Plan:   1. Essential hypertension Blood pressure is at goal on current medication regimen. Will continue medications as previously prescribed. No proteinuria present.  Patient taking a Yoga and Bettter Balance course, at [employer] [facility] 3 times per week. Patient is currently drinks supplemental shakes and meals to maintain weight. Reports that he drinks 4-5 glasses of water per day. - Urinalysis, Complete - COMPLETE METABOLIC PANEL WITH GFR  2. Chronic kidney disease (CKD) stage G2/A3, mildly decreased glomerular filtration rate (GFR) between 60-89 mL/min/1.73 square meter and albuminuria creatinine ratio greater than 300 mg/g Will check serum creatinine and GFR on today  3. Prediabetes -  Hemoglobin A1C  Preventative Care:  Cardiologist: Heart and cholesterol.  Dr. Brunilda PayorNesi; Urologist, Digital prostate, 1 year ago  Colonoscopy was 1 year ago; Dr. Jeani HawkingPatrick Hung   Will follow up in 6 months for Medicare Wellness Exam   The patient was given clear instructions to go to ER or return to medical center if symptoms do not improve, worsen or new problems develop. The patient verbalized understanding. Will notify patient with laboratory results.  Massie MaroonHollis,Lindsy Cerullo M, FNP

## 2015-07-16 ENCOUNTER — Telehealth: Payer: Self-pay

## 2015-07-16 LAB — URINALYSIS, COMPLETE
Bacteria, UA: NONE SEEN [HPF]
Bilirubin Urine: NEGATIVE
Casts: NONE SEEN [LPF]
Crystals: NONE SEEN [HPF]
Glucose, UA: NEGATIVE
Hgb urine dipstick: NEGATIVE
Ketones, ur: NEGATIVE
Leukocytes, UA: NEGATIVE
Nitrite: NEGATIVE
Protein, ur: NEGATIVE
RBC / HPF: NONE SEEN RBC/HPF (ref <=2)
Specific Gravity, Urine: 1.024 (ref 1.001–1.035)
Squamous Epithelial / LPF: NONE SEEN [HPF] (ref <=5)
WBC, UA: NONE SEEN WBC/HPF (ref <=5)
Yeast: NONE SEEN [HPF]
pH: 6.5 (ref 5.0–8.0)

## 2015-07-16 NOTE — Telephone Encounter (Signed)
-----   Message from Massie MaroonLachina M Hollis, [place] sent at 07/16/2015  8:13 AM EDT ----- Regarding: lab results Please inform patient that all labs are consistent with base line. Creatinine, indicating kidney functioning remains mildly elevated. I will continue Losartan as previously prescribed.   Thanks  ----- Message -----    From: Lab in [facility] Interface    Sent: 07/16/2015   5:42 AM      To: Massie MaroonLachina M Hollis, FNP

## 2015-07-16 NOTE — Telephone Encounter (Signed)
Called patient.  No answer.

## 2015-07-21 ENCOUNTER — Other Ambulatory Visit: Payer: Self-pay

## 2015-07-21 ENCOUNTER — Ambulatory Visit: Payer: Self-pay | Admitting: Nurse Practitioner

## 2015-07-22 NOTE — Telephone Encounter (Signed)
Called, no answer and no way to leave voicemail. Will try later. Thanks!

## 2015-07-23 NOTE — Telephone Encounter (Signed)
We have tried to contact patient by phone but have been unsuccessful. Will mail letter. Thanks!

## 2015-08-02 ENCOUNTER — Ambulatory Visit (INDEPENDENT_AMBULATORY_CARE_PROVIDER_SITE_OTHER): Payer: Medicare Other | Admitting: Nurse Practitioner

## 2015-08-02 ENCOUNTER — Other Ambulatory Visit: Payer: Medicare Other

## 2015-08-02 ENCOUNTER — Encounter: Payer: Self-pay | Admitting: Nurse Practitioner

## 2015-08-02 VITALS — BP 140/100 | HR 68 | Ht 72.0 in | Wt 209.6 lb

## 2015-08-02 DIAGNOSIS — R0989 Other specified symptoms and signs involving the circulatory and respiratory systems: Secondary | ICD-10-CM

## 2015-08-02 DIAGNOSIS — I10 Essential (primary) hypertension: Secondary | ICD-10-CM | POA: Diagnosis not present

## 2015-08-02 DIAGNOSIS — I2583 Coronary atherosclerosis due to lipid rich plaque: Secondary | ICD-10-CM

## 2015-08-02 DIAGNOSIS — I2510 Atherosclerotic heart disease of native coronary artery without angina pectoris: Secondary | ICD-10-CM | POA: Diagnosis not present

## 2015-08-02 LAB — LIPID PANEL
Cholesterol: 120 mg/dL — ABNORMAL LOW (ref 125–200)
HDL: 56 mg/dL (ref >=40)
LDL Cholesterol: 48 mg/dL (ref <130)
Total CHOL/HDL Ratio: 2.1 Ratio (ref <=5.0)
Triglycerides: 81 mg/dL (ref <150)
VLDL: 16 mg/dL (ref <30)

## 2015-08-02 NOTE — Addendum Note (Signed)
Addended by: BOWDEN, ROBIN K on: 08/02/2015 10:38 AM   Modules accepted: Orders  

## 2015-08-02 NOTE — Patient Instructions (Addendum)
We will be checking the following labs today - lipids   Medication Instructions:    Continue with your current medicines.     Testing/Procedures To Be Arranged:  Carotid doppler  Follow-Up:   See me in 3 months; see Dr. Duke Salvia in 6 months.     Other Special Instructions:   Try to cut back on the salt - less frozen dinners.     If you need a refill on your cardiac medications before your next appointment, please call your pharmacy.   Call the [facility] office at [phone] if you have any questions, problems or concerns.     Low-Sodium Eating Plan Sodium raises blood pressure and causes water to be held in the body. Getting less sodium from food will help lower your blood pressure, reduce any swelling, and protect your heart, liver, and kidneys. We get sodium by adding salt (sodium chloride) to food. Most of our sodium comes from canned, boxed, and frozen foods. Restaurant foods, fast foods, and pizza are also very high in sodium. Even if you take medicine to lower your blood pressure or to reduce fluid in your body, getting less sodium from your food is important. WHAT IS MY PLAN? Most people should limit their sodium intake to 2,300 mg a day. Your health care provider recommends that you limit your sodium intake to __________ a day.  WHAT DO I NEED TO KNOW ABOUT THIS EATING PLAN? For the low-sodium eating plan, you will follow these general guidelines:  Choose foods with a % Daily Value for sodium of less than 5% (as listed on the food label).   Use salt-free seasonings or herbs instead of table salt or sea salt.   Check with your health care provider or pharmacist before using salt substitutes.   Eat fresh foods.  Eat more vegetables and fruits.  Limit canned vegetables. If you do use them, rinse them well to decrease the sodium.   Limit cheese to 1 oz (28 g) per day.   Eat lower-sodium products, often labeled as "lower  sodium" or "no salt added."  Avoid foods that contain monosodium glutamate (MSG). MSG is sometimes added to [place] food and some canned foods.  Check food labels (Nutrition Facts labels) on foods to learn how much sodium is in one serving.  Eat more home-cooked food and less restaurant, buffet, and fast food.  When eating at a restaurant, ask that your food be prepared with less salt, or no salt if possible.  HOW DO I READ FOOD LABELS FOR SODIUM INFORMATION? The Nutrition Facts label lists the amount of sodium in one serving of the food. If you eat more than one serving, you must multiply the listed amount of sodium by the number of servings. Food labels may also identify foods as:  Sodium free--Less than 5 mg in a serving.  Very low sodium--35 mg or less in a serving.  Low sodium--140 mg or less in a serving.  Light in sodium--50% less sodium in a serving. For example, if a food that usually has 300 mg of sodium is changed to become light in sodium, it will have 150 mg of sodium.  Reduced sodium--25% less sodium in a serving. For example, if a food that usually has 400 mg of sodium is changed to reduced sodium, it will have 300 mg of sodium. WHAT FOODS CAN I EAT? Grains Low-sodium cereals, including oats, puffed wheat and rice, and shredded wheat cereals. Low-sodium crackers. Unsalted  rice and pasta. Lower-sodium bread.  Vegetables Frozen or fresh vegetables. Low-sodium or reduced-sodium canned vegetables. Low-sodium or reduced-sodium tomato sauce and paste. Low-sodium or reduced-sodium tomato and vegetable juices.  Fruits Fresh, frozen, and canned fruit. Fruit juice.  Meat and Other Protein Products Low-sodium canned tuna and salmon. Fresh or frozen meat, poultry, seafood, and fish. Lamb. Unsalted nuts. Dried beans, peas, and lentils without added salt. Unsalted canned beans. Homemade soups without salt. Eggs.  Dairy Milk. Soy milk. Ricotta cheese. Low-sodium or  reduced-sodium cheeses. Yogurt.  Condiments Fresh and dried herbs and spices. Salt-free seasonings. Onion and garlic powders. Low-sodium varieties of mustard and ketchup. Fresh or refrigerated horseradish. Lemon juice.  Fats and Oils Reduced-sodium salad dressings. Unsalted butter.  Other Unsalted popcorn and pretzels.  The items listed above may not be a complete list of recommended foods or beverages. Contact your dietitian for more options. WHAT FOODS ARE NOT RECOMMENDED? Grains Instant hot cereals. Bread stuffing, pancake, and biscuit mixes. Croutons. Seasoned rice or pasta mixes. Noodle soup cups. Boxed or frozen macaroni and cheese. Self-rising flour. Regular salted crackers. Vegetables Regular canned vegetables. Regular canned tomato sauce and paste. Regular tomato and vegetable juices. Frozen vegetables in sauces. Salted [place] fries. Olives. Rosita FirePickles. Relishes. Sauerkraut. Salsa. Meat and Other Protein Products Salted, canned, smoked, spiced, or pickled meats, seafood, or fish. Bacon, ham, sausage, hot dogs, corned beef, chipped beef, and packaged luncheon meats. Salt pork. Jerky. Pickled herring. Anchovies, regular canned tuna, and sardines. Salted nuts. Dairy Processed cheese and cheese spreads. Cheese curds. Blue cheese and cottage cheese. Buttermilk.  Condiments Onion and garlic salt, seasoned salt, table salt, and sea salt. Canned and packaged gravies. Worcestershire sauce. Tartar sauce. Barbecue sauce. Teriyaki sauce. Soy sauce, including reduced sodium. Steak sauce. Fish sauce. Oyster sauce. Cocktail sauce. Horseradish that you find on the shelf. Regular ketchup and mustard. Meat flavorings and tenderizers. Bouillon cubes. Hot sauce. Tabasco sauce. Marinades. Taco seasonings. Relishes. Fats and Oils Regular salad dressings. Salted butter. Margarine. Ghee. Bacon fat.  Other Potato and tortilla chips. Corn chips and puffs. Salted popcorn and pretzels. Canned or dried  soups. Pizza. Frozen entrees and pot pies.  The items listed above may not be a complete list of foods and beverages to avoid. Contact your dietitian for more information.   This information is not intended to replace advice given to you by your health care provider. Make sure you discuss any questions you have with your health care provider.   Document Released: 09/02/2001 Document Revised: 04/03/2014 Document Reviewed: 01/15/2013 Elsevier Interactive Patient Education [employer]2016 Elsevier Inc.

## 2015-08-02 NOTE — Progress Notes (Signed)
CARDIOLOGY OFFICE NOTE  Date:  08/02/2015    Gilbert DivineWilliam E Doody Date of Birth: 1945-08-11 Medical Record #[id number]  PCP:  Massie MaroonHollis,Lachina M, FNP  Cardiologist:  Former patient of Dr. Yevonne PaxBrackbill's    Chief Complaint  Patient presents with  . Coronary Artery Disease  . Hypertension  . Hyperlipidemia    Follow up visit - seen for Dr. Patty SermonsBrackbill    History of Present Illness: Gilbert Harris is a 70 y.o. male who presents today for a 6 month check. Former patient of Dr. Yevonne PaxBrackbill's.   He has a history of HTN, CAD with remote CABG back in 2007, HLD and schizophrenia - lives in a group home. Quit smoking in 2007 following his CABG. Last seen in October and felt to be doing well. Syncopal spell back in November - negative event monitor noted - attributed to low BP/dehydration - ARB cut back at that time and he was treated with IVF.   Comes back today. Here alone. Says he is doing well. No chest pain. Not short of breath. Eats a fair amount of frozen dinners - gets lots of salt. Not able to check BP at home. Exercises occasionally. Weight is up a few pounds. He feels ok on his medicines. He is happy with how he is currently doing. No more passing out spells.   Past Medical History  Diagnosis Date  . Coronary artery disease     CABG x4 LIMA to LAD, SV graft to RCA, sequential saphenous vein graft to diagnal and OM1  . HTN (hypertension)   . Hyperlipidemia   . Schizophrenia (HCC)   . MI (myocardial infarction) [facility](HCC)     Past Surgical History  Procedure Laterality Date  . Cardiac catheterization      EF 50%--severe two vessel obstructive atherosclerotic coronary artery disease--Low normal left ventricle funcrtion  . Coronary artery bypass graft  2007  . Hernia repair    . Tonsillectomy and adenoidectomy Bilateral y-59     Medications: Current Outpatient Prescriptions  Medication Sig Dispense Refill  . aspirin (ASPIRIN EC) 81 MG EC tablet Take 81 mg by mouth daily. Swallow whole.      Marland Kitchen. atorvastatin (LIPITOR) 20 MG tablet Take 1 tablet by mouth  daily 90 tablet 2  . busPIRone (BUSPAR) 5 MG tablet Take 1 tablet (5 mg total) by mouth 2 (two) times daily. 60 tablet 6  . hydroxypropyl methylcellulose / hypromellose (ISOPTO TEARS / GONIOVISC) 2.5 % ophthalmic solution Place 1 drop into both eyes 4 (four) times daily as needed for dry eyes.    Marland Kitchen. losartan (COZAAR) 50 MG tablet Take 1 tablet (50 mg total) by mouth daily. 90 tablet 3  . metoprolol tartrate (LOPRESSOR) 25 MG tablet Take 1 tablet by mouth two  times daily 180 tablet 1  . Multiple Vitamin (MULTIVITAMIN) tablet Take 1 tablet by mouth daily.      Marland Kitchen. NITROSTAT 0.4 MG SL tablet Dissolve 1 tablet under the tongue as needed for chest  pain every 5 minutes 75 tablet 3  . thioridazine (MELLARIL) 25 MG tablet Take 1 tablet (25 mg total) by mouth 2 (two) times daily. 60 tablet 7   No current facility-administered medications for this visit.    Allergies: Allergies  Allergen Reactions  . Haloperidol Decanoate Shortness Of Breath    Social History: The patient  reports that he quit smoking about 9 years ago. His smoking use included Cigarettes. He has never used smokeless tobacco. He reports that  he does not drink alcohol or use illicit drugs.   Family History: The patient's family history includes Heart disease in his father and mother; Heart failure in his maternal aunt.   Review of Systems: Please see the history of present illness.   Otherwise, the review of systems is positive for none.   All other systems are reviewed and negative.   Physical Exam: VS:  BP 140/100 mmHg  Pulse 68  Ht 6' (1.829 m)  Wt 209 lb 9.6 oz (95.074 kg)  BMI 28.42 kg/m2 .  BMI Body mass index is 28.42 kg/(m^2).  Wt Readings from Last 3 Encounters:  08/02/15 209 lb 9.6 oz (95.074 kg)  07/15/15 210 lb (95.255 kg)  03/26/15 207 lb 3.2 oz (93.985 kg)   BP by me is 138/90.  General: Pleasant. Well developed, well nourished and in no acute  distress.  HEENT: Normal. Neck: Supple, no JVD, + right carotid bruit.  Cardiac: Regular rate and rhythm. No murmurs, rubs, or gallops. No edema.  Respiratory:  Lungs are clear to auscultation bilaterally with normal work of breathing.  GI: Soft and nontender.  MS: No deformity or atrophy. Gait and ROM intact. Skin: Warm and dry. Color is normal.  Neuro:  Strength and sensation are intact and no gross focal deficits noted.  Psych: Alert, appropriate and with normal affect.   LABORATORY DATA:  EKG:  EKG is not ordered today.  Lab Results  Component Value Date   WBC 5.5 02/04/2015   HGB 12.8* 02/04/2015   HCT 38.1* 02/04/2015   PLT 124* 02/04/2015   GLUCOSE 81 07/15/2015   CHOL 126 01/25/2015   TRIG 96 01/25/2015   HDL 56 01/25/2015   LDLCALC 51 01/25/2015   ALT 28 07/15/2015   AST 28 07/15/2015   NA 137 07/15/2015   K 4.5 07/15/2015   CL 99 07/15/2015   CREATININE 1.35* 07/15/2015   BUN 14 07/15/2015   CO2 28 07/15/2015   TSH 1.703 06/05/2013   HGBA1C 6.1* 07/15/2015   MICROALBUR 1.4 07/16/2014    BNP (last 3 results) No results for input(s): BNP in the last 8760 hours.  ProBNP (last 3 results) No results for input(s): PROBNP in the last 8760 hours.   Other Studies Reviewed Today:   Assessment/Plan: 1. CAD - with remote CABG - no active symptoms. Continue with medical therapy and CV risk factor modification  2. Carotid bruit - needs duplex   3. HLD - lipids today  4. HTN -  BP not at goal - I suspect he gets too much salt - have asked him to try and cut back. He has had syncope in the setting of low BP in the past.   5. Prior syncope - has not recurred - felt to be due to dehydration/low BP  Current medicines are reviewed with the patient today.  The patient does not have concerns regarding medicines other than what has been noted above.  The following changes have been made:  See above.  Labs/ tests ordered today include:   No orders of the defined  types were placed in this encounter.     Disposition:   FU with me in 3 months; Dr. Duke Salvia in 6 months.   Patient is agreeable to this plan and will call if any problems develop in the interim.   Signed: Rosalio Macadamia, RN, ANP-C 08/02/2015 10:29 AM  [facility] [address] [place], [place]  [address] Phone: [phone] Fax: [phone]

## 2015-08-02 NOTE — Addendum Note (Signed)
Addended by: Tonita PhoenixBOWDEN, ROBIN K on: 08/02/2015 10:38 AM   Modules accepted: Orders

## 2015-08-10 ENCOUNTER — Ambulatory Visit (HOSPITAL_COMMUNITY)
Admission: RE | Admit: 2015-08-10 | Discharge: 2015-08-10 | Disposition: A | Discharge status: Home / Self Care | Payer: Medicare Other | Source: Ambulatory Visit | Attending: Internal Medicine | Admitting: Internal Medicine

## 2015-08-10 DIAGNOSIS — I10 Essential (primary) hypertension: Secondary | ICD-10-CM | POA: Insufficient documentation

## 2015-08-10 DIAGNOSIS — E785 Hyperlipidemia, unspecified: Secondary | ICD-10-CM | POA: Diagnosis not present

## 2015-08-10 DIAGNOSIS — I6522 Occlusion and stenosis of left carotid artery: Secondary | ICD-10-CM | POA: Diagnosis not present

## 2015-08-10 DIAGNOSIS — I2583 Coronary atherosclerosis due to lipid rich plaque: Secondary | ICD-10-CM

## 2015-08-10 DIAGNOSIS — I6523 Occlusion and stenosis of bilateral carotid arteries: Secondary | ICD-10-CM | POA: Diagnosis not present

## 2015-08-10 DIAGNOSIS — I2510 Atherosclerotic heart disease of native coronary artery without angina pectoris: Secondary | ICD-10-CM | POA: Diagnosis not present

## 2015-08-10 DIAGNOSIS — R0989 Other specified symptoms and signs involving the circulatory and respiratory systems: Secondary | ICD-10-CM | POA: Diagnosis not present

## 2015-08-25 ENCOUNTER — Ambulatory Visit (INDEPENDENT_AMBULATORY_CARE_PROVIDER_SITE_OTHER): Payer: Medicare Other | Admitting: Psychiatry

## 2015-08-25 DIAGNOSIS — F200 Paranoid schizophrenia: Secondary | ICD-10-CM

## 2015-08-25 MED ORDER — BUSPIRONE HCL 5 MG PO TABS: 5.0000 mg | ORAL_TABLET | Freq: Two times a day (BID) | ORAL | Status: DC

## 2015-08-25 MED ORDER — THIORIDAZINE HCL 25 MG PO TABS: 25.0000 mg | ORAL_TABLET | Freq: Two times a day (BID) | ORAL | Status: DC

## 2015-08-25 NOTE — Progress Notes (Signed)
Patient ID: Gilbert Harris Lavalle, male   DOB: 12-Sep-1945, 70 y.o.   MRN: [id number] [address] [facility]  08/25/2015 1:20 PM Gilbert Harris Moen  MRN:  [id number] Subjective: Doing well Principal Problem: Schizophrenia residual Diagnosis:  Schizophrenia residual Today the patient is doing well. He appears on time. The patient is very well dressed. He is very articulate. He is calm friendly very engaging. He denies daily depression. He denies any psychotic symptoms. We'll review his last psychiatric hospitalization which is 40 years ago when at that time he was depressed and heard voices. He actually had 4 psychiatric hospitalizations in the past. Today he is clearly well. He has not anhedonic. Enjoys oil painting. He is sleeping and eating very well. His got great energy. He is no problems concentrating. He loves to exercise. The patient denies the use of alcohol or drugs. The patient lives at [facility] with about 20 other people enjoys it. The patient has 2 sons who are their 4340s and are doing well. He is no grandchildren. The patient denies any particular anxiety symptoms. He is functioning extremely well. Today he had a aims test and shows no evidence of tardive dyskinesia. The patient has a significant cardiovascular condition and presently is being followed closely by his primary care doctor and his cardiologist. The patient be getting a new cardiologist because his previous cardiologist as retired. Patient is full of energy. Cognitively he is very intact. Patient shows no evidence of psychopathology at this time. Total Time spent with patient: 30 minutes  Past Psychiatric History:   Past Medical History:  Past Medical History  Diagnosis Date  . Coronary artery disease     CABG x4 LIMA to LAD, SV graft to RCA, sequential saphenous vein graft to diagnal and OM1  . HTN (hypertension)   . Hyperlipidemia   . Schizophrenia (HCC)   . MI (myocardial infarction) [facility](HCC)     Past Surgical History  Procedure  Laterality Date  . Cardiac catheterization      EF 50%--severe two vessel obstructive atherosclerotic coronary artery disease--Low normal left ventricle funcrtion  . Coronary artery bypass graft  2007  . Hernia repair    . Tonsillectomy and adenoidectomy Bilateral y-59   Family History:  Family History  Problem Relation Age of Onset  . Heart disease Mother   . Heart disease Father   . Heart failure Maternal Aunt    Family Psychiatric  History:  Social History:  History  Alcohol Use No     History  Drug Use No    Social History   Social History  . Marital Status: Single    Spouse Name: N/A  . Number of Children: N/A  . Years of Education: N/A   Social History Main Topics  . Smoking status: Former Smoker    Types: Cigarettes    Quit date: 02/03/2006  . Smokeless tobacco: Never Used  . Alcohol Use: No  . Drug Use: No  . Sexual Activity: Not Currently   Other Topics Concern  . Not on file   Social History Narrative   Additional Social History:                         Sleep: Good  Appetite:  Good  Current Medications: Current Outpatient Prescriptions  Medication Sig Dispense Refill  . aspirin (ASPIRIN EC) 81 MG EC tablet Take 81 mg by mouth daily. Swallow whole.    Marland Kitchen. atorvastatin (LIPITOR) 20 MG tablet  Take 1 tablet by mouth  daily 90 tablet 2  . busPIRone (BUSPAR) 5 MG tablet Take 1 tablet (5 mg total) by mouth 2 (two) times daily. 60 tablet 7  . hydroxypropyl methylcellulose / hypromellose (ISOPTO TEARS / GONIOVISC) 2.5 % ophthalmic solution Place 1 drop into both eyes 4 (four) times daily as needed for dry eyes.    Marland Kitchen losartan (COZAAR) 50 MG tablet Take 1 tablet (50 mg total) by mouth daily. 90 tablet 3  . metoprolol tartrate (LOPRESSOR) 25 MG tablet Take 1 tablet by mouth two  times daily 180 tablet 1  . Multiple Vitamin (MULTIVITAMIN) tablet Take 1 tablet by mouth daily.      Marland Kitchen NITROSTAT 0.4 MG SL tablet Dissolve 1 tablet under the tongue as  needed for chest  pain every 5 minutes 75 tablet 3  . thioridazine (MELLARIL) 25 MG tablet Take 1 tablet (25 mg total) by mouth 2 (two) times daily. 60 tablet 7   No current facility-administered medications for this visit.    Lab Results: No results found for this or any previous visit (from the past 48 hour(s)).  Physical Findings: AIMS:  , ,  ,  ,    CIWA:    COWS:     Musculoskeletal: Strength & Muscle Tone: within normal limits Gait & Station: normal Patient leans:  Psychiatric Specialty Exam: ROS  There were no vitals taken for this visit.There is no weight on file to calculate BMI.  General Appearance: Meticulous  Eye Contact::  Good  Speech:  Clear and Coherent  Volume:  Normal  Mood:  Euthymic  Affect:  Appropriate  Thought Process:  Coherent  Orientation:  Full (Time, Place, and Person)  Thought Content:  WDL  Suicidal Thoughts:  No  Homicidal Thoughts:  No  Memory:  NA  Judgement:  NA  Insight:  Good  Psychomotor Activity:  Normal  Concentration:  Good  Recall:  Good  Fund of Knowledge:Good  Language: Good  Akathisia:  No  Handed:  Right  AIMS (if indicated):     Assets:  Communication Skills  ADL's:  Intact  Cognition: WNL  Sleep:      Treatment Plan Summary: Today the patient is doing well. He denies any chest pain or shortness of breath or any neurological symptoms. He'll continue taking Mellaril as ordered and low-dose BuSpar 5 mg twice a day. I think the patient should stay on both these agents indefinitely. Patient she'll return to see me in 6 months. Patient was instructed to call us if there are any problems at all. He is functioning extremely well and demonstrates no evidence of psychosis. [place] Lundon Verdejo 08/25/2015, 1:20 PM

## 2015-08-30 ENCOUNTER — Telehealth: Payer: Self-pay | Admitting: Nurse Practitioner

## 2015-08-30 NOTE — Telephone Encounter (Signed)
Spoke to patient. Request results of carotid. Result given to patient verbalized understanding- aware to contact primary in regards to thyroid

## 2015-08-30 NOTE — Telephone Encounter (Signed)
New Message  Pt requesting to speak with RN about result.

## 2015-09-02 ENCOUNTER — Other Ambulatory Visit: Payer: Self-pay | Admitting: *Deleted

## 2015-09-02 MED ORDER — METOPROLOL TARTRATE 25 MG PO TABS: ORAL_TABLET | ORAL | Status: DC

## 2015-11-02 ENCOUNTER — Ambulatory Visit (INDEPENDENT_AMBULATORY_CARE_PROVIDER_SITE_OTHER): Payer: Medicare Other | Admitting: Nurse Practitioner

## 2015-11-02 ENCOUNTER — Encounter (INDEPENDENT_AMBULATORY_CARE_PROVIDER_SITE_OTHER): Payer: Self-pay

## 2015-11-02 ENCOUNTER — Encounter: Payer: Self-pay | Admitting: Nurse Practitioner

## 2015-11-02 VITALS — BP 142/60 | HR 62 | Ht 72.0 in | Wt 214.8 lb

## 2015-11-02 DIAGNOSIS — E7800 Pure hypercholesterolemia, unspecified: Secondary | ICD-10-CM

## 2015-11-02 DIAGNOSIS — I2510 Atherosclerotic heart disease of native coronary artery without angina pectoris: Secondary | ICD-10-CM

## 2015-11-02 DIAGNOSIS — I10 Essential (primary) hypertension: Secondary | ICD-10-CM | POA: Diagnosis not present

## 2015-11-02 DIAGNOSIS — I2583 Coronary atherosclerosis due to lipid rich plaque: Principal | ICD-10-CM

## 2015-11-02 LAB — BASIC METABOLIC PANEL
BUN: 14 mg/dL (ref 7–25)
CO2: 30 mmol/L (ref 20–31)
Calcium: 9.4 mg/dL (ref 8.6–10.3)
Chloride: 102 mmol/L (ref 98–110)
Creat: 1.3 mg/dL — ABNORMAL HIGH (ref 0.70–1.25)
Glucose, Bld: 93 mg/dL (ref 65–99)
Potassium: 4.7 mmol/L (ref 3.5–5.3)
Sodium: 138 mmol/L (ref 135–146)

## 2015-11-02 MED ORDER — LOSARTAN POTASSIUM 100 MG PO TABS: 100.0000 mg | ORAL_TABLET | Freq: Every day | ORAL | 3 refills | Status: DC

## 2015-11-02 NOTE — Patient Instructions (Addendum)
We will be checking the following labs today - BMET   Medication Instructions:    Continue with your current medicines. BUT  I am increasing the Losartan to 100 mg a day -  You can take 2 of your 50 mg tablets to = this dose and use up - the RX for the 100 mg has been sent to your mail order.     Testing/Procedures To Be Arranged:  N/A  Follow-Up:   See Dr. Duke Salviaandolph in 2 months.    Other Special Instructions:   Try to limit your salt.   Let us know if you have any dizzy spells.    If you need a refill on your cardiac medications before your next appointment, please call your pharmacy.   Call the [facility] office at [phone] if you have any questions, problems or concerns.

## 2015-11-02 NOTE — Progress Notes (Signed)
CARDIOLOGY OFFICE NOTE  Date:  11/02/2015    Astrid Divine Date of Birth: 1945/04/08 Medical Record #[id number]  PCP:  Massie Maroon, FNP  Cardiologist:  Tyrone Sage    Chief Complaint  Patient presents with  . Coronary Artery Disease  . Hypertension  . Hyperlipidemia    3 month check - former patient of Dr. Yevonne Pax    History of Present Illness: Gilbert Harris is a 70 y.o. male who presents today for a follow up visit. This is a 3 month check. Former patient of Dr. Yevonne Pax.   He has a history of HTN, CAD with remote CABG back in 2007, HLD and schizophrenia - lives in a group home. Quit smoking in 2007 following his CABG. Last seen in October and felt to be doing well. Syncopal spell back in November - negative event monitor noted - attributed to low BP/dehydration - ARB cut back at that time and he was treated with IVF.   I last saw him back in May - he was getting way too much salt. Otherwise seemed to be doing ok.   Comes back today. Here alone. Has had no problems. No chest pain. Not short of breath. He is eating out and continues to probably get too much salt. He is trying to be more active. He has gained weight. Not able to check his BP where he lives. Sounds like he is in some type of group home. Loves to paint, draw, etc.   Past Medical History:  Diagnosis Date  . Coronary artery disease    CABG x4 LIMA to LAD, SV graft to RCA, sequential saphenous vein graft to diagnal and OM1  . HTN (hypertension)   . Hyperlipidemia   . MI (myocardial infarction) (HCC)   . Schizophrenia [facility])     Past Surgical History:  Procedure Laterality Date  . CARDIAC CATHETERIZATION     EF 50%--severe two vessel obstructive atherosclerotic coronary artery disease--Low normal left ventricle funcrtion  . CORONARY ARTERY BYPASS GRAFT  2007  . HERNIA REPAIR    . TONSILLECTOMY AND ADENOIDECTOMY Bilateral y-59     Medications: Current Outpatient Prescriptions  Medication Sig  Dispense Refill  . aspirin (ASPIRIN EC) 81 MG EC tablet Take 81 mg by mouth daily. Swallow whole.    Marland Kitchen atorvastatin (LIPITOR) 20 MG tablet Take 1 tablet by mouth  daily 90 tablet 2  . busPIRone (BUSPAR) 5 MG tablet Take 1 tablet (5 mg total) by mouth 2 (two) times daily. 60 tablet 7  . hydroxypropyl methylcellulose / hypromellose (ISOPTO TEARS / GONIOVISC) 2.5 % ophthalmic solution Place 1 drop into both eyes 4 (four) times daily as needed for dry eyes.    Marland Kitchen losartan (COZAAR) 50 MG tablet Take 1 tablet (50 mg total) by mouth daily. 90 tablet 3  . metoprolol tartrate (LOPRESSOR) 25 MG tablet Take 1 tablet by mouth two  times daily 180 tablet 3  . Multiple Vitamin (MULTIVITAMIN) tablet Take 1 tablet by mouth daily.      Marland Kitchen NITROSTAT 0.4 MG SL tablet Dissolve 1 tablet under the tongue as needed for chest  pain every 5 minutes 75 tablet 3  . thioridazine (MELLARIL) 25 MG tablet Take 1 tablet (25 mg total) by mouth 2 (two) times daily. 60 tablet 7   No current facility-administered medications for this visit.     Allergies: Allergies  Allergen Reactions  . Haloperidol Decanoate Shortness Of Breath    Social History: The patient  reports that he quit smoking about 9 years ago. His smoking use included Cigarettes. He has never used smokeless tobacco. He reports that he does not drink alcohol or use drugs.   Family History: The patient's family history includes Heart disease in his father and mother; Heart failure in his maternal aunt.   Review of Systems: Please see the history of present illness.   Otherwise, the review of systems is positive for none.   All other systems are reviewed and negative.   Physical Exam: VS:  BP (!) 142/60   Pulse 62   Ht 6' (1.829 m)   Wt 214 lb 12.8 oz (97.4 kg)   SpO2 98%   BMI 29.13 kg/m  .  BMI Body mass index is 29.13 kg/m.  Wt Readings from Last 3 Encounters:  11/02/15 214 lb 12.8 oz (97.4 kg)  08/02/15 209 lb 9.6 oz (95.1 kg)  07/15/15 210 lb  (95.3 kg)   BP by me is 140/110  General: Pleasant. Well developed, well nourished and in no acute distress.   HEENT: Normal.  Neck: Supple, no JVD, carotid bruits, or masses noted.  Cardiac: Regular rate and rhythm. Heart tones are distant. No edema.  Respiratory:  Lungs are clear to auscultation bilaterally with normal work of breathing.  GI: Soft and nontender.  MS: No deformity or atrophy. Gait and ROM intact.  Skin: Warm and dry. Color is normal.  Neuro:  Strength and sensation are intact and no gross focal deficits noted.  Psych: Alert, appropriate and with normal affect.   LABORATORY DATA:  EKG:  EKG is not ordered today.  Lab Results  Component Value Date   WBC 5.5 02/04/2015   HGB 12.8 (L) 02/04/2015   HCT 38.1 (L) 02/04/2015   PLT 124 (L) 02/04/2015   GLUCOSE 81 07/15/2015   CHOL 120 (L) 08/02/2015   TRIG 81 08/02/2015   HDL 56 08/02/2015   LDLCALC 48 08/02/2015   ALT 28 07/15/2015   AST 28 07/15/2015   NA 137 07/15/2015   K 4.5 07/15/2015   CL 99 07/15/2015   CREATININE 1.35 (H) 07/15/2015   BUN 14 07/15/2015   CO2 28 07/15/2015   TSH 1.703 06/05/2013   HGBA1C 6.1 (H) 07/15/2015   MICROALBUR 1.4 07/16/2014    BNP (last 3 results) No results for input(s): BNP in the last 8760 hours.  ProBNP (last 3 results) No results for input(s): PROBNP in the last 8760 hours.   Other Studies Reviewed Today:   Assessment/Plan: 1. CAD - with remote CABG - no active symptoms. Continue with medical therapy and CV risk factor modification  2. Carotid disease - has had his duplex back in May with 1 to 39% bilateral disease noted. Would follow.   3. HLD - on statin therapy  4. HTN -  BP not at goal - much higher today - I still suspect he gets too much salt - have asked him to try and cut back. He has had syncope in the setting of low BP in the past but given current reading - will try him on 100 mg of Losartan. Will get him back for early follow up with Dr.  Duke Salvia. BMET today.   5. Prior syncope - has not recurred - felt to be due to past issues with dehydration/low BP   Current medicines are reviewed with the patient today.  The patient does not have concerns regarding medicines other than what has been noted above.  The following  changes have been made:  See above.  Labs/ tests ordered today include:   No orders of the defined types were placed in this encounter.    Disposition:   FU with Dr. Duke Salviaandolph in about 2 months.  Patient is agreeable to this plan and will call if any problems develop in the interim.   Signed: Rosalio MacadamiaLori C. Leona Alen, RN, ANP-C 11/02/2015 10:57 AM  [facility] [address] [place]  [address] Phone: [phone]) [phone] Fax: [phone]) [phone]

## 2015-11-18 ENCOUNTER — Other Ambulatory Visit: Payer: Self-pay | Admitting: *Deleted

## 2015-11-18 MED ORDER — ATORVASTATIN CALCIUM 20 MG PO TABS: 20.0000 mg | ORAL_TABLET | Freq: Every day | ORAL | 3 refills | Status: DC

## 2016-01-04 NOTE — Progress Notes (Signed)
Cardiology Office Note   Date:  01/05/2016   ID:  Gilbert Harris, DOB [birth year], MRN [id number]  PCP:  Gilbert MaroonHollis,Lachina M, FNP  Cardiologist:   Chilton Siiffany Mora, MD   No chief complaint on file.     History of Present Illness: Gilbert Harris is a 70 y.o. male with hypertension, hyperlipidemia, CAD s/p MI and CABG in 2007, mild carotid stenosis, and schizophrenia who presents for follow up.  Gilbert Harris was previously a patient of Gilbert Harris.  He last saw Gilbert Harris 10/2015, at which time his blood pressure was not at goal.  He was asked to limit salt intake and started on losartan 100 mg daily.  He had a syncopal event 01/2015 and subsequently had an unremarkable 30 day event monitor.  The event was attributed to low blood pressure and dehydration.  Since his last appointment Gilbert Harris has been feeling well. He denies chest pain or shortness of breath. He exercises at a recreational center 2-3 times per week doing chair yoga and balance exercises. He has no symptoms with these activities. He has not noted any lower extremity edema, palpitations, lightheadedness, or dizziness. He continues to abstain from smoking.   Past Medical History:  Diagnosis Date  . Carotid stenosis 01/05/2016  . Coronary artery disease    CABG x4 LIMA to LAD, SV graft to RCA, sequential saphenous vein graft to diagnal and OM1  . HTN (hypertension)   . Hyperlipidemia   . MI (myocardial infarction)   . Schizophrenia [facility](HCC)     Past Surgical History:  Procedure Laterality Date  . CARDIAC CATHETERIZATION     EF 50%--severe two vessel obstructive atherosclerotic coronary artery disease--Low normal left ventricle funcrtion  . CORONARY ARTERY BYPASS GRAFT  2007  . HERNIA REPAIR    . TONSILLECTOMY AND ADENOIDECTOMY Bilateral y-59     Current Outpatient Prescriptions  Medication Sig Dispense Refill  . aspirin (ASPIRIN EC) 81 MG EC tablet Take 81 mg by mouth daily. Swallow whole.    Marland Kitchen. atorvastatin (LIPITOR) 20  MG tablet Take 1 tablet (20 mg total) by mouth daily. 90 tablet 3  . busPIRone (BUSPAR) 5 MG tablet Take 1 tablet (5 mg total) by mouth 2 (two) times daily. 60 tablet 7  . hydroxypropyl methylcellulose / hypromellose (ISOPTO TEARS / GONIOVISC) 2.5 % ophthalmic solution Place 1 drop into both eyes 4 (four) times daily as needed for dry eyes.    Marland Kitchen. losartan (COZAAR) 100 MG tablet Take 1 tablet (100 mg total) by mouth daily. 90 tablet 3  . metoprolol tartrate (LOPRESSOR) 25 MG tablet Take 1 tablet by mouth two  times daily 180 tablet 3  . Multiple Vitamin (MULTIVITAMIN) tablet Take 1 tablet by mouth daily.      Marland Kitchen. NITROSTAT 0.4 MG SL tablet Dissolve 1 tablet under the tongue as needed for chest  pain every 5 minutes 75 tablet 3  . thioridazine (MELLARIL) 25 MG tablet Take 1 tablet (25 mg total) by mouth 2 (two) times daily. 60 tablet 7   No current facility-administered medications for this visit.     Allergies:   Haloperidol decanoate    Social History:  The patient  reports that he quit smoking about 9 years ago. His smoking use included Cigarettes. He has never used smokeless tobacco. He reports that he does not drink alcohol or use drugs.   Family History:  The patient's family history includes Heart attack in his father and mother; Heart disease in  his father and mother; Heart failure in his maternal aunt.    ROS:  Please see the history of present illness.   Otherwise, review of systems are positive for none.   All other systems are reviewed and negative.    PHYSICAL EXAM: VS:  BP (!) 138/98   Pulse 64   Ht 6' (1.829 Harris)   Wt 208 lb 6.4 oz (94.5 kg)   BMI 28.26 kg/Harris  , BMI Body mass index is 28.26 kg/Harris. GENERAL:  Well appearing HEENT:  Pupils equal round and reactive, fundi not visualized, oral mucosa unremarkable NECK:  No jugular venous distention, waveform within normal limits, carotid upstroke brisk and symmetric, +R carotid bruit, no thyromegaly LYMPHATICS:  No cervical  adenopathy LUNGS:  Clear to auscultation bilaterally HEART:  RRR.  PMI not displaced or sustained,S1 and S2 within normal limits, no S3, no S4, no clicks, no rubs, no murmurs ABD:  Flat, positive bowel sounds normal in frequency in pitch, no bruits, no rebound, no guarding, no midline pulsatile mass, no hepatomegaly, no splenomegaly EXT:  2 plus pulses throughout, no edema, no cyanosis no clubbing SKIN:  No rashes no nodules NEURO:  Cranial nerves II through XII grossly intact, motor grossly intact throughout PSYCH:  Cognitively intact, oriented to person place and time   EKG:  EKG is ordered today. The ekg ordered today demonstrates sinus rhythm. Rate 64 bpm. nonspecific T wave abnormalities.   Recent Labs: 02/04/2015: Hemoglobin 12.8; Platelets 124 07/15/2015: ALT 28 11/02/2015: BUN 14; Creat 1.30; Potassium 4.7; Sodium 138   Carotid DOppler 07/2015: 1-39% stenosis bilaterally   Lipid Panel    Component Value Date/Time   CHOL 120 (L) 08/02/2015 1038   TRIG 81 08/02/2015 1038   HDL 56 08/02/2015 1038   CHOLHDL 2.1 08/02/2015 1038   VLDL 16 08/02/2015 1038   LDLCALC 48 08/02/2015 1038      Wt Readings from Last 3 Encounters:  01/05/16 208 lb 6.4 oz (94.5 kg)  11/02/15 214 lb 12.8 oz (97.4 kg)  08/02/15 209 lb 9.6 oz (95.1 kg)      ASSESSMENT AND PLAN:  # Hypertension:  Blood pressure was elevated on initial assessment but normalized on repeat to 138/8 therefore, we will not make any changes at this time, especially given his history of syncope.     #  CAD s/p CABG: Currently asymptomatic.   Continue aspirin, metoprolol, and atorvastatin. He was congratulated on his exercise.   # Hyperlipidemia: LDL 48 07/2015.  Continue atorvastatin.   # Carotid stenosis: Mild bilateral carotid stenosis 07/2015.  Continue aspirin and atorvastatin.  Time spent: 25 minutes-Greater than 50% of this time was spent in counseling, explanation of diagnosis, planning of further management, and  coordination of care.   Current medicines are reviewed at length with the patient today.  The patient does not have concerns regarding medicines.  The following changes have been made:  no change  Labs/ tests ordered today include:  No orders of the defined types were placed in this encounter.    Disposition:   FU with Gearald Stonebraker C. Duke Salvia, MD, [facility] in  3 months.    This note was written with the assistance of speech recognition software.  Please excuse any transcriptional errors.  Signed, Shireen Rayburn C. Duke Salvia, MD, [facility]  01/05/2016 9:20 AM    [facility]

## 2016-01-05 ENCOUNTER — Ambulatory Visit (INDEPENDENT_AMBULATORY_CARE_PROVIDER_SITE_OTHER): Payer: Medicare Other | Admitting: Cardiovascular Disease

## 2016-01-05 ENCOUNTER — Encounter (INDEPENDENT_AMBULATORY_CARE_PROVIDER_SITE_OTHER): Payer: Self-pay

## 2016-01-05 ENCOUNTER — Encounter: Payer: Self-pay | Admitting: Cardiovascular Disease

## 2016-01-05 VITALS — BP 138/98 | HR 64 | Ht 72.0 in | Wt 208.4 lb

## 2016-01-05 DIAGNOSIS — I10 Essential (primary) hypertension: Secondary | ICD-10-CM

## 2016-01-05 DIAGNOSIS — E7800 Pure hypercholesterolemia, unspecified: Secondary | ICD-10-CM

## 2016-01-05 DIAGNOSIS — Z951 Presence of aortocoronary bypass graft: Secondary | ICD-10-CM

## 2016-01-05 DIAGNOSIS — I119 Hypertensive heart disease without heart failure: Secondary | ICD-10-CM | POA: Diagnosis not present

## 2016-01-05 DIAGNOSIS — I6523 Occlusion and stenosis of bilateral carotid arteries: Secondary | ICD-10-CM | POA: Diagnosis not present

## 2016-01-05 DIAGNOSIS — I6529 Occlusion and stenosis of unspecified carotid artery: Secondary | ICD-10-CM

## 2016-01-05 HISTORY — DX: Occlusion and stenosis of unspecified carotid artery: I65.29

## 2016-01-05 NOTE — Patient Instructions (Signed)
Medication Instructions:  Your physician recommends that you continue on your current medications as directed. Please refer to the Current Medication list given to you today.  Labwork: NONE  Testing/Procedures: NONE  Follow-Up: Your physician recommends that you schedule a follow-up appointment in: 3 MONTH OV  If you need a refill on your cardiac medications before your next appointment, please call your pharmacy.  

## 2016-01-14 ENCOUNTER — Encounter: Payer: Self-pay | Admitting: Family Medicine

## 2016-01-14 ENCOUNTER — Ambulatory Visit (INDEPENDENT_AMBULATORY_CARE_PROVIDER_SITE_OTHER): Payer: Medicare Other | Admitting: Family Medicine

## 2016-01-14 VITALS — BP 117/88 | HR 71 | Temp 98.1°F | Resp 18 | Ht 72.0 in | Wt 213.0 lb

## 2016-01-14 DIAGNOSIS — Z23 Encounter for immunization: Secondary | ICD-10-CM

## 2016-01-14 DIAGNOSIS — I10 Essential (primary) hypertension: Secondary | ICD-10-CM

## 2016-01-14 LAB — LIPID PANEL
Cholesterol: 142 mg/dL (ref 125–200)
HDL: 61 mg/dL (ref >=40)
LDL Cholesterol: 65 mg/dL (ref <130)
Total CHOL/HDL Ratio: 2.3 Ratio (ref <=5.0)
Triglycerides: 81 mg/dL (ref <150)
VLDL: 16 mg/dL (ref <30)

## 2016-01-14 LAB — COMPLETE METABOLIC PANEL WITH GFR
ALT: 30 U/L (ref 9–46)
AST: 30 U/L (ref 10–35)
Albumin: 4.4 g/dL (ref 3.6–5.1)
Alkaline Phosphatase: 62 U/L (ref 40–115)
BUN: 14 mg/dL (ref 7–25)
CO2: 28 mmol/L (ref 20–31)
Calcium: 9.5 mg/dL (ref 8.6–10.3)
Chloride: 103 mmol/L (ref 98–110)
Creat: 1.47 mg/dL — ABNORMAL HIGH (ref 0.70–1.18)
GFR, Est African American: 55 mL/min — ABNORMAL LOW (ref >=60)
GFR, Est Non African American: 48 mL/min — ABNORMAL LOW (ref >=60)
Glucose, Bld: 97 mg/dL (ref 65–99)
Potassium: 5 mmol/L (ref 3.5–5.3)
Sodium: 139 mmol/L (ref 135–146)
Total Bilirubin: 0.9 mg/dL (ref 0.2–1.2)
Total Protein: 7 g/dL (ref 6.1–8.1)

## 2016-01-14 LAB — CBC WITH DIFFERENTIAL/PLATELET
Basophils Absolute: 69 cells/uL (ref 0–200)
Basophils Relative: 1 %
Eosinophils Absolute: 138 cells/uL (ref 15–500)
Eosinophils Relative: 2 %
HCT: 45.7 % (ref 38.5–50.0)
Hemoglobin: 15.4 g/dL (ref 13.2–17.1)
Lymphocytes Relative: 29 %
Lymphs Abs: 2001 cells/uL (ref 850–3900)
MCH: 30.9 pg (ref 27.0–33.0)
MCHC: 33.7 g/dL (ref 32.0–36.0)
MCV: 91.8 fL (ref 80.0–100.0)
MPV: 11.2 fL (ref 7.5–12.5)
Monocytes Absolute: 690 cells/uL (ref 200–950)
Monocytes Relative: 10 %
Neutro Abs: 4002 cells/uL (ref 1500–7800)
Neutrophils Relative %: 58 %
Platelets: 186 10*3/uL (ref 140–400)
RBC: 4.98 MIL/uL (ref 4.20–5.80)
RDW: 13.6 % (ref 11.0–15.0)
WBC: 6.9 10*3/uL (ref 3.8–10.8)

## 2016-01-14 MED ORDER — PNEUMOCOCCAL 13-VAL CONJ VACC IM SUSP
0.5000 mL | INTRAMUSCULAR | Status: AC | PRN
  Administered 2016-01-14: 0.5 mL via INTRAMUSCULAR

## 2016-01-14 NOTE — Progress Notes (Signed)
Patient is here for Wellness Check  Patient denies pain at this time.  Patient has not taken medication today and patient has not eaten today.  Patient would like the flu vaccine today. Patient tolerated flu vaccine well today.  Patient tolerated Pneumococcal vaccine well today.

## 2016-01-17 NOTE — Progress Notes (Signed)
Gilbert Harris, is a 70 y.o. male  WUJ:[id number]  NFA:[id number]  DOB - [birth year]  CC:  Chief Complaint  Patient presents with  . Medicare Wellness       HPI: Gilbert Harris is a 70 y.o. male here for Medicare Wellness Check. He has a history of carotid stenosis, CAD, CABG X 4, MI, hypertension, hyperlipidemia and schizophrenia. All of his cardiac and hypertension related drugs are managed by cardiology.   Past Surgical Hisotry  Testicular surgery at 9 years, jaw surgery at age 34.  Hospitalizations: for above surgeries, for MI and cardiac surgeries.  He reports eye exam 2 years ago.  Marital Status: Divorced Education Background. BS with post BS classes. has 1 living brother, 2 living sisters, one brother deceased.  Children: 2 grown children Alcohol: None, Tobacco: None, stopped smoking 10 years ago. Drug Use: None Exercise: Several times a week.  Do you drive:  No. By choice. Colonoscopy:  3 years ago.  Bowel Habits:  Daily.  Falls:  None, no concerns. ADL: indepentent Advances Directives:  NO.  Other  Providers: Norma Fredrickson, NP, Cardiology, Psy: [place].  Recent A!C 6.1 To receive Influenza and Pneumoccal 23 today.  Hepatitis screening today. Has been screened for HIV.   Allergies  Allergen Reactions  . Haloperidol Decanoate Shortness Of Breath   Past Medical History:  Diagnosis Date  . Carotid stenosis 01/05/2016  . Coronary artery disease    CABG x4 LIMA to LAD, SV graft to RCA, sequential saphenous vein graft to diagnal and OM1  . HTN (hypertension)   . Hyperlipidemia   . MI (myocardial infarction)   . Schizophrenia [facility])    Current Outpatient Prescriptions on File Prior to Visit  Medication Sig Dispense Refill  . aspirin (ASPIRIN EC) 81 MG EC tablet Take 81 mg by mouth daily. Swallow whole.    Marland Kitchen atorvastatin (LIPITOR) 20 MG tablet Take 1 tablet (20 mg total) by mouth daily. 90 tablet 3  . busPIRone (BUSPAR) 5 MG tablet Take 1 tablet (5 mg total) by mouth  2 (two) times daily. 60 tablet 7  . hydroxypropyl methylcellulose / hypromellose (ISOPTO TEARS / GONIOVISC) 2.5 % ophthalmic solution Place 1 drop into both eyes 4 (four) times daily as needed for dry eyes.    Marland Kitchen losartan (COZAAR) 100 MG tablet Take 1 tablet (100 mg total) by mouth daily. 90 tablet 3  . metoprolol tartrate (LOPRESSOR) 25 MG tablet Take 1 tablet by mouth two  times daily 180 tablet 3  . Multiple Vitamin (MULTIVITAMIN) tablet Take 1 tablet by mouth daily.      Marland Kitchen NITROSTAT 0.4 MG SL tablet Dissolve 1 tablet under the tongue as needed for chest  pain every 5 minutes 75 tablet 3  . thioridazine (MELLARIL) 25 MG tablet Take 1 tablet (25 mg total) by mouth 2 (two) times daily. 60 tablet 7   No current facility-administered medications on file prior to visit.    Family History  Problem Relation Age of Onset  . Heart disease Mother   . Heart attack Mother   . Heart disease Father   . Heart attack Father   . Heart disease Maternal Grandmother   . Stroke Maternal Grandfather   . Heart failure Maternal Aunt    Social History   Social History  . Marital status: Single    Spouse name: N/A  . Number of children: N/A  . Years of education: N/A   Occupational History  . Not on file.  Social History Main Topics  . Smoking status: Former Smoker    Types: Cigarettes    Quit date: 02/03/2006  . Smokeless tobacco: Never Used  . Alcohol use No  . Drug use: No  . Sexual activity: Not Currently   Other Topics Concern  . Not on file   Social History Narrative  . No narrative on file    Review of Systems: Constitutional: Negative Skin: Negative HENT: Negative  Eyes: Negative  Neck: Negative Respiratory: Negative Cardiovascular: Negative Gastrointestinal: Negative Genitourinary: Negative  Musculoskeletal: Negative   Neurological: Negative for Hematological: Negative  Psychiatric/Behavioral: Negative    Objective:   Vitals:   01/14/16 0956  BP: 117/88  Pulse:  71  Resp: 18  Temp: 98.1 F (36.7 C)    Physical Exam: Constitutional: Patient appears well-developed and well-nourished. No distress. HENT: Normocephalic, atraumatic, External right and left ear normal. Oropharynx is clear and moist.  Eyes: Conjunctivae and EOM are normal. PERRLA, no scleral icterus. Neck: Normal ROM. Neck supple. No lymphadenopathy, No thyromegaly. CVS: RRR, S1/S2 +, no murmurs, no gallops, no rubs Pulmonary: Effort and breath sounds normal, no stridor, rhonchi, wheezes, rales.  Abdominal: Soft. Normoactive BS,, no distension, tenderness, rebound or guarding.  Musculoskeletal: Normal range of motion. No edema and no tenderness.  Neuro: Alert.Normal muscle tone coordination. Non-focal Skin: Skin is warm and dry. No rash noted. Not diaphoretic. No erythema. No pallor. Psychiatric: Normal mood and affect. Behavior, judgment, thought content normal.  Lab Results  Component Value Date   WBC 6.9 01/14/2016   HGB 15.4 01/14/2016   HCT 45.7 01/14/2016   MCV 91.8 01/14/2016   PLT 186 01/14/2016   Lab Results  Component Value Date   CREATININE 1.47 (H) 01/14/2016   BUN 14 01/14/2016   NA 139 01/14/2016   K 5.0 01/14/2016   CL 103 01/14/2016   CO2 28 01/14/2016    Lab Results  Component Value Date   HGBA1C 6.1 (H) 07/15/2015   Lipid Panel     Component Value Date/Time   CHOL 142 01/14/2016 1042   TRIG 81 01/14/2016 1042   HDL 61 01/14/2016 1042   CHOLHDL 2.3 01/14/2016 1042   VLDL 16 01/14/2016 1042   LDLCALC 65 01/14/2016 1042       Assessment and plan:   1. Needs flu shot  - Flu Vaccine QUAD 36+ mos PF IM (Fluarix & Fluzone Quad PF)  2. Essential hypertension  - COMPLETE METABOLIC PANEL WITH GFR - CBC with Differential - Lipid panel  3. Need for prophylactic vaccination against Streptococcus pneumoniae (pneumococcus)  - pneumococcal 13-valent conjugate vaccine (PREVNAR 13) injection 0.5 mL; Inject 0.5 mLs into the muscle Prior to discharge  for immunization.   No Follow-up on file.  The patient was given clear instructions to go to ER or return to medical center if symptoms don't improve, worsen or new problems develop. The patient verbalized understanding.    Henrietta HooverLinda C Avabella Wailes FNP  01/17/2016, 1:12 PM

## 2016-01-19 NOTE — Patient Instructions (Signed)
Continue current treatment regimine. Follow-up in 3 months.

## 2016-02-25 ENCOUNTER — Ambulatory Visit (HOSPITAL_COMMUNITY): Payer: Self-pay | Admitting: Psychiatry

## 2016-03-01 ENCOUNTER — Ambulatory Visit (INDEPENDENT_AMBULATORY_CARE_PROVIDER_SITE_OTHER): Payer: Medicare Other | Admitting: Psychiatry

## 2016-03-01 ENCOUNTER — Encounter (HOSPITAL_COMMUNITY): Payer: Self-pay | Admitting: Psychiatry

## 2016-03-01 VITALS — BP 124/82 | HR 81 | Ht 72.0 in | Wt 218.0 lb

## 2016-03-01 DIAGNOSIS — Z79899 Other long term (current) drug therapy: Secondary | ICD-10-CM

## 2016-03-01 DIAGNOSIS — Z9889 Other specified postprocedural states: Secondary | ICD-10-CM | POA: Diagnosis not present

## 2016-03-01 DIAGNOSIS — Z823 Family history of stroke: Secondary | ICD-10-CM | POA: Diagnosis not present

## 2016-03-01 DIAGNOSIS — Z8249 Family history of ischemic heart disease and other diseases of the circulatory system: Secondary | ICD-10-CM

## 2016-03-01 DIAGNOSIS — F200 Paranoid schizophrenia: Secondary | ICD-10-CM

## 2016-03-01 DIAGNOSIS — Z7982 Long term (current) use of aspirin: Secondary | ICD-10-CM

## 2016-03-01 DIAGNOSIS — Z87891 Personal history of nicotine dependence: Secondary | ICD-10-CM

## 2016-03-01 MED ORDER — BUSPIRONE HCL 5 MG PO TABS: 5.0000 mg | ORAL_TABLET | Freq: Two times a day (BID) | ORAL | 7 refills | Status: DC

## 2016-03-01 MED ORDER — THIORIDAZINE HCL 25 MG PO TABS: 25.0000 mg | ORAL_TABLET | Freq: Two times a day (BID) | ORAL | 7 refills | Status: DC

## 2016-03-01 NOTE — Progress Notes (Signed)
Patient ID: Gilbert Harris, male   DOB: 11/17/1945, 70 y.o.   MRN: [id number] [facility] MD Progress Note  03/01/2016 1:39 PM Gilbert Harris  MRN:  [id number] Subjective: Doing well Principal Problem: Schizophrenia residual Diagnosis:  Schizophrenia residual Today the patient is doing well. As usually is well dressed. He is very articulate. He's very engaging. The patient denies any depression. He denies any anxiety. He denies any evidence of psychosis. 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 The patient is doing very well in his environment. Physically stable. He has a new cardiologist. He is well-controlled hypertension. He denies any chest pain or shortness of breath or any other cardiac symptoms patient is sleeping and eating very well. He's got good energy. At this time the patient is not in any relationships. The patient has good plans for Christmas. He'll be with all his family. He enjoyed Thanksgiving. Patient denies the use of alcohol or drugs.  Total Time spent with patient: 30 minutes  Past Psychiatric History:   Past Medical History:  Past Medical History:  Diagnosis Date  . Carotid stenosis 01/05/2016  . Coronary artery disease    CABG x4 LIMA to LAD, SV graft to RCA, sequential saphenous vein graft to diagnal and OM1  . HTN (hypertension)   . Hyperlipidemia   . MI (myocardial infarction)   . Schizophrenia [facility](H[facility])     Past Surgical History:  Procedure Laterality Date  . CARDIAC CATHETERIZATION     EF 50%--severe two vessel obstructive atherosclerotic coronary artery disease--Low normal left ventricle funcrtion  . CORONARY ARTERY BYPASS GRAFT  2007  . HERNIA REPAIR    . TONSILLECTOMY AND ADENOIDECTOMY Bilateral y-59   Family History:  Family History  Problem Relation Age of Onset  . Heart disease Mother   . Heart attack Mother   . Heart disease Father   . Heart attack Father   . Heart disease Maternal Grandmother   . Stroke Maternal Grandfather   . Heart failure Maternal Aunt     Family Psychiatric  History:  Social History:  History  Alcohol Use No     History  Drug Use No    Social History   Social History  . Marital status: Single    Spouse name: N/A  . Number of children: N/A  . Years of education: N/A   Social History Main Topics  . Smoking status: Former Smoker    Types: Cigarettes    Quit date: 02/03/2006  . Smokeless tobacco: Never Used  . Alcohol use No  . Drug use: No  . Sexual activity: Not Currently   Other Topics Concern  . None   Social History Narrative  . None   Additional Social History:                         Sleep: Good  Appetite:  Good  Current Medications: Current Outpatient Prescriptions  Medication Sig Dispense Refill  . aspirin (ASPIRIN EC) 81 MG EC tablet Take 81 mg by mouth daily. Swallow whole.    Marland Kitchen. atorvastatin (LIPITOR) 20 MG tablet Take 1 tablet (20 mg total) by mouth daily. 90 tablet 3  . busPIRone (BUSPAR) 5 MG tablet Take 1 tablet (5 mg total) by mouth 2 (two) times daily. 60 tablet 7  . hydroxypropyl methylcellulose / hypromellose (ISOPTO TEARS / GONIOVISC) 2.5 % ophthalmic solution Place 1 drop into both eyes 4 (four) times daily as needed for dry eyes.    .Marland Kitchen  metoprolol tartrate (LOPRESSOR) 25 MG tablet Take 1 tablet by mouth two  times daily 180 tablet 3  . Multiple Vitamin (MULTIVITAMIN) tablet Take 1 tablet by mouth daily.      Marland Kitchen. NITROSTAT 0.4 MG SL tablet Dissolve 1 tablet under the tongue as needed for chest  pain every 5 minutes 75 tablet 3  . thioridazine (MELLARIL) 25 MG tablet Take 1 tablet (25 mg total) by mouth 2 (two) times daily. 60 tablet 7  . Vitamin D, Cholecalciferol, 400 units CHEW Chew 400 Units by mouth daily.    Marland Kitchen. losartan (COZAAR) 100 MG tablet Take 1 tablet (100 mg total) by mouth daily. 90 tablet 3   No current facility-administered medications for this visit.     Lab Results: No results found for this or any previous visit (from the past 48 hour(s)).  Physical  Findings: AIMS:  , ,  ,  ,    CIWA:    COWS:     Musculoskeletal: Strength & Muscle Tone: within normal limits Gait & Station: normal Patient leans:  Psychiatric Specialty Exam: ROS  Blood pressure 124/82, pulse 81, height 6' (1.829 m), weight 218 lb (98.9 kg).Body mass index is 29.57 kg/m.  General Appearance: Meticulous  Eye Contact::  Good  Speech:  Clear and Coherent  Volume:  Normal  Mood:  Euthymic  Affect:  Appropriate  Thought Process:  Coherent  Orientation:  Full (Time, Place, and Person)  Thought Content:  WDL  Suicidal Thoughts:  No  Homicidal Thoughts:  No  Memory:  NA  Judgement:  NA  Insight:  Good  Psychomotor Activity:  Normal  Concentration:  Good  Recall:  Good  Fund of Knowledge:Good  Language: Good  Akathisia:  No  Handed:  Right  AIMS (if indicated):     Assets:  Communication Skills  ADL's:  Intact  Cognition: WNL  Sleep:      Treatment Plan Summary: 03/01/2016, 1:39 PM  At this time the patient is doing very well. He shows no evidence of psychosis. Continue taking is no role in his BuSpar as prescribed. He demonstrates no evidence of tardive dyskinesia or EPS. He is physically very healthy. He denies chest pain or shortness of breath or any neurological symptoms is getting good medical care in the community by primary care doctor as well as a cardiologist. He'll return to see me in 6 months or 30 minute visit.

## 2016-03-28 NOTE — Progress Notes (Signed)
Cardiology Office Note   Date:  03/31/2016   ID:  Gilbert Harris, DOB 1945/07/03, MRN [id number]  PCP:  Massie Maroon, FNP  Cardiologist:   Chilton Si, MD   Chief Complaint  Patient presents with  . Follow-up      History of Present Illness: Gilbert Harris is a 71 y.o. male with hypertension, hyperlipidemia, CAD s/p MI and CABG in 2007, mild carotid stenosis, syncope, and schizophrenia who presents for follow up.  Gilbert Harris was previously a patient of Dr. Patty Sermons. He has been doing very well.  He continues to exercise twice per week.  He denies chest pain or shortness of breath with this activity.  He also denies lower extremity edema, orthopnea or PND.  He has noted a cough for the last several days.  This occurs intermittently for the last 4-5 years.  He denies GERD or a sour taste in his mouth.  He also denies fever or chills and the cough is not productive.  Cough syrup has been helpful.    Past Medical History:  Diagnosis Date  . Carotid stenosis 01/05/2016  . Coronary artery disease    CABG x4 LIMA to LAD, SV graft to RCA, sequential saphenous vein graft to diagnal and OM1  . HTN (hypertension)   . Hyperlipidemia   . MI (myocardial infarction)   . Schizophrenia [facility])     Past Surgical History:  Procedure Laterality Date  . CARDIAC CATHETERIZATION     EF 50%--severe two vessel obstructive atherosclerotic coronary artery disease--Low normal left ventricle funcrtion  . CORONARY ARTERY BYPASS GRAFT  2007  . HERNIA REPAIR    . TONSILLECTOMY AND ADENOIDECTOMY Bilateral y-59     Current Outpatient Prescriptions  Medication Sig Dispense Refill  . aspirin (ASPIRIN EC) 81 MG EC tablet Take 81 mg by mouth daily. Swallow whole.    Marland Kitchen atorvastatin (LIPITOR) 20 MG tablet Take 1 tablet (20 mg total) by mouth daily. 90 tablet 3  . busPIRone (BUSPAR) 5 MG tablet Take 1 tablet (5 mg total) by mouth 2 (two) times daily. 60 tablet 7  . hydroxypropyl methylcellulose /  hypromellose (ISOPTO TEARS / GONIOVISC) 2.5 % ophthalmic solution Place 1 drop into both eyes 4 (four) times daily as needed for dry eyes.    Marland Kitchen LORazepam (ATIVAN) 1 MG tablet Take 1 mg by mouth daily.    Marland Kitchen losartan (COZAAR) 100 MG tablet Take 1 tablet (100 mg total) by mouth daily. 90 tablet 3  . metoprolol tartrate (LOPRESSOR) 50 MG tablet Take 1 tablet by mouth two  times daily 180 tablet 3  . Multiple Vitamin (MULTIVITAMIN) tablet Take 1 tablet by mouth daily.      Marland Kitchen NITROSTAT 0.4 MG SL tablet Dissolve 1 tablet under the tongue as needed for chest  pain every 5 minutes 75 tablet 3  . thioridazine (MELLARIL) 25 MG tablet Take 1 tablet (25 mg total) by mouth 2 (two) times daily. 60 tablet 7  . Vitamin D, Cholecalciferol, 400 units CHEW Chew 400 Units by mouth daily.     No current facility-administered medications for this visit.     Allergies:   Haloperidol decanoate    Social History:  The patient  reports that he quit smoking about 10 years ago. His smoking use included Cigarettes. He has never used smokeless tobacco. He reports that he does not drink alcohol or use drugs.   Family History:  The patient's family history includes Heart attack in his father  and mother; Heart disease in his father, maternal grandmother, and mother; Heart failure in his maternal aunt; Stroke in his maternal grandfather.    ROS:  Please see the history of present illness.   Otherwise, review of systems are positive for none.   All other systems are reviewed and negative.    PHYSICAL EXAM: VS:  BP (!) 144/88   Pulse 80   Ht 6' (1.829 m)   Wt 97.1 kg (214 lb)   BMI 29.02 kg/m  , BMI Body mass index is 29.02 kg/m. GENERAL:  Well appearing HEENT:  Pupils equal round and reactive, fundi not visualized, oral mucosa unremarkable NECK:  No jugular venous distention, waveform within normal limits, carotid upstroke brisk and symmetric, +R carotid bruit LYMPHATICS:  No cervical adenopathy LUNGS:  Clear to  auscultation bilaterally HEART:  RRR.  PMI not displaced or sustained,S1 and S2 within normal limits, no S3, no S4, no clicks, no rubs, no murmurs ABD:  Flat, positive bowel sounds normal in frequency in pitch, no bruits, no rebound, no guarding, no midline pulsatile mass, no hepatomegaly, no splenomegaly EXT:  2 plus pulses throughout, no edema, no cyanosis no clubbing SKIN:  No rashes no nodules NEURO:  Cranial nerves II through XII grossly intact, motor grossly intact throughout PSYCH:  Cognitively intact, oriented to person place and time   EKG:  EKG is  Not ordered today. The ekg ordered 01/05/16 demonstrates sinus rhythm. Rate 64 bpm. nonspecific T wave abnormalities.   Recent Labs: 01/14/2016: ALT 30; BUN 14; Creat 1.47; Hemoglobin 15.4; Platelets 186; Potassium 5.0; Sodium 139   Carotid DOppler 07/2015: 1-39% stenosis bilaterally   Lipid Panel    Component Value Date/Time   CHOL 142 01/14/2016 1042   TRIG 81 01/14/2016 1042   HDL 61 01/14/2016 1042   CHOLHDL 2.3 01/14/2016 1042   VLDL 16 01/14/2016 1042   LDLCALC 65 01/14/2016 1042      Wt Readings from Last 3 Encounters:  03/31/16 97.1 kg (214 lb)  01/14/16 96.6 kg (213 lb)  01/05/16 94.5 kg (208 lb 6.4 oz)      ASSESSMENT AND PLAN:  # Hypertension:  Blood pressure is elevated today and has been elevated on previous visits.  We will increase metoprolol to 50mg  bid. Continue losartan 100mg  daily.    #  CAD s/p CABG: Currently asymptomatic.   Continue aspirin, metoprolol, and atorvastatin. He was congratulated on his exercise.   # Hyperlipidemia: LDL 65 on 12/2015.  Continue atorvastatin.   # Carotid stenosis: Mild bilateral carotid stenosis 07/2015.  Continue aspirin and atorvastatin.  Time spent: 25 minutes-Greater than 50% of this time was spent in counseling, explanation of diagnosis, planning of further management, and coordination of care.   Current medicines are reviewed at length with the patient today.   The patient does not have concerns regarding medicines.  The following changes have been made:  no change  Labs/ tests ordered today include:  No orders of the defined types were placed in this encounter.    Disposition:   FU with Tanya Crothers C. Duke Salviaandolph, MD, [facility] in  3 months.    This note was written with the assistance of speech recognition software.  Please excuse any transcriptional errors.  Signed, Kayan Blissett C. Duke Salviaandolph, MD, [facility]  03/31/2016 5:25 PM     [facility]

## 2016-03-31 ENCOUNTER — Encounter: Payer: Self-pay | Admitting: Cardiovascular Disease

## 2016-03-31 ENCOUNTER — Ambulatory Visit (INDEPENDENT_AMBULATORY_CARE_PROVIDER_SITE_OTHER): Payer: Medicare Other | Admitting: Cardiovascular Disease

## 2016-03-31 VITALS — BP 144/88 | HR 80 | Ht 72.0 in | Wt 214.0 lb

## 2016-03-31 DIAGNOSIS — Z951 Presence of aortocoronary bypass graft: Secondary | ICD-10-CM | POA: Diagnosis not present

## 2016-03-31 DIAGNOSIS — I119 Hypertensive heart disease without heart failure: Secondary | ICD-10-CM

## 2016-03-31 DIAGNOSIS — I6523 Occlusion and stenosis of bilateral carotid arteries: Secondary | ICD-10-CM

## 2016-03-31 DIAGNOSIS — E7800 Pure hypercholesterolemia, unspecified: Secondary | ICD-10-CM

## 2016-03-31 MED ORDER — METOPROLOL TARTRATE 50 MG PO TABS: ORAL_TABLET | ORAL | 3 refills | Status: DC

## 2016-03-31 NOTE — Patient Instructions (Addendum)
Medication Instructions:  INCREASE YOUR METOPROLOL TO 50 MG TWICE A DAY.  TAKE 2 OF YOUR 25 MG TABLETS UNTIL YOU FINISH YOUR CURRENT SUPPLY. A NEW PRESCRIPTION OF THE 50 MG TABLETS HAS BEEN SENT TO [facility]   Labwork: NONE  Testing/Procedures: NONE  Follow-Up: Your physician wants you to follow-up in: 3 MONTH OV You will receive a reminder letter in the mail two months in advance. If you don't receive a letter, please call our office to schedule the follow-up appointment.  If you need a refill on your cardiac medications before your next appointment, please call your pharmacy.

## 2016-04-21 ENCOUNTER — Ambulatory Visit (INDEPENDENT_AMBULATORY_CARE_PROVIDER_SITE_OTHER): Payer: Medicare Other | Admitting: Family Medicine

## 2016-04-21 ENCOUNTER — Encounter: Payer: Self-pay | Admitting: Family Medicine

## 2016-04-21 VITALS — BP 126/98 | HR 68 | Temp 97.7°F | Resp 16 | Ht 72.0 in | Wt 218.0 lb

## 2016-04-21 DIAGNOSIS — Z1159 Encounter for screening for other viral diseases: Secondary | ICD-10-CM | POA: Diagnosis not present

## 2016-04-21 DIAGNOSIS — I10 Essential (primary) hypertension: Secondary | ICD-10-CM | POA: Diagnosis not present

## 2016-04-21 DIAGNOSIS — E7800 Pure hypercholesterolemia, unspecified: Secondary | ICD-10-CM

## 2016-04-21 DIAGNOSIS — R7303 Prediabetes: Secondary | ICD-10-CM

## 2016-04-21 LAB — POCT GLYCOSYLATED HEMOGLOBIN (HGB A1C): Hemoglobin A1C: 5.7

## 2016-04-21 NOTE — Patient Instructions (Addendum)
Hypertension Hypertension is another name for high blood pressure. High blood pressure forces your heart to work harder to pump blood. A blood pressure reading has two numbers, which includes a higher number over a lower number (example: 110/72). Follow these instructions at home:  Have your blood pressure rechecked by your doctor.  Only take medicine as told by your doctor. Follow the directions carefully. The medicine does not work as well if you skip doses. Skipping doses also puts you at risk for problems.  Do not smoke.  Monitor your blood pressure at home as told by your doctor. Contact a doctor if:  You think you are having a reaction to the medicine you are taking.  You have repeat headaches or feel dizzy.  You have puffiness (swelling) in your ankles.  You have trouble with your vision. Get help right away if:  You get a very bad headache and are confused.  You feel weak, numb, or faint.  You get chest or belly (abdominal) pain.  You throw up (vomit).  You cannot breathe very well. This information is not intended to replace advice given to you by your health care provider. Make sure you discuss any questions you have with your health care provider. Document Released: 08/30/2007 Document Revised: 08/19/2015 Document Reviewed: 01/03/2013 Elsevier Interactive Patient Education  2017 [employer]  High Cholesterol High cholesterol is a condition in which the blood has high levels of a white, waxy, fat-like substance (cholesterol). The human body needs small amounts of cholesterol. The liver makes all the cholesterol that the body needs. Extra (excess) cholesterol comes from the food that we eat. Cholesterol is carried from the liver by the blood through the blood vessels. If you have high cholesterol, deposits (plaques) may build up on the walls of your blood vessels (arteries). Plaques make the arteries narrower and stiffer. Cholesterol plaques increase your risk for  heart attack and stroke. Work with your health care provider to keep your cholesterol levels in a healthy range. What increases the risk? This condition is more likely to develop in people who:  Eat foods that are high in animal fat (saturated fat) or cholesterol.  Are overweight.  Are not getting enough exercise.  Have a family history of high cholesterol. What are the signs or symptoms? There are no symptoms of this condition. How is this diagnosed? This condition may be diagnosed from the results of a blood test.  If you are older than age 71, your health care provider may check your cholesterol every 4-6 years.  You may be checked more often if you already have high cholesterol or other risk factors for heart disease. The blood test for cholesterol measures:  "Bad" cholesterol (LDL cholesterol). This is the main type of cholesterol that causes heart disease. The desired level for LDL is less than 100.  "Good" cholesterol (HDL cholesterol). This type helps to protect against heart disease by cleaning the arteries and carrying the LDL away. The desired level for HDL is 60 or higher.  Triglycerides. These are fats that the body can store or burn for energy. The desired number for triglycerides is lower than 150.  Total cholesterol. This is a measure of the total amount of cholesterol in your blood, including LDL cholesterol, HDL cholesterol, and triglycerides. A healthy number is less than 200. How is this treated? This condition is treated with diet changes, lifestyle changes, and medicines. Diet changes  This may include eating more whole grains, fruits, vegetables, nuts, and  fish.  This may also include cutting back on red meat and foods that have a lot of added sugar. Lifestyle changes  Changes may include getting at least 40 minutes of aerobic exercise 3 times a week. Aerobic exercises include walking, biking, and swimming. Aerobic exercise along with a healthy diet can  help you maintain a healthy weight.  Changes may also include quitting smoking. Medicines  Medicines are usually given if diet and lifestyle changes have failed to reduce your cholesterol to healthy levels.  Your health care provider may prescribe a statin medicine. Statin medicines have been shown to reduce cholesterol, which can reduce the risk of heart disease. Follow these instructions at home: Eating and drinking If told by your health care provider:  Eat chicken (without skin), fish, veal, shellfish, ground [place] breast, and round or loin cuts of red meat.  Do not eat fried foods or fatty meats, such as hot dogs and salami.  Eat plenty of fruits, such as apples.  Eat plenty of vegetables, such as broccoli, potatoes, and carrots.  Eat beans, peas, and lentils.  Eat grains such as barley, rice, couscous, and bulgur wheat.  Eat pasta without cream sauces.  Use skim or nonfat milk, and eat low-fat or nonfat yogurt and cheeses.  Do not eat or drink whole milk, cream, ice cream, egg yolks, or hard cheeses.  Do not eat stick margarine or tub margarines that contain trans fats (also called partially hydrogenated oils).  Do not eat saturated tropical oils, such as coconut oil and palm oil.  Do not eat cakes, cookies, crackers, or other baked goods that contain trans fats. General instructions  Exercise as directed by your health care provider. Increase your activity level with activities such as gardening, walking, and taking the stairs.  Take over-the-counter and prescription medicines only as told by your health care provider.  Do not use any products that contain nicotine or tobacco, such as cigarettes and e-cigarettes. If you need help quitting, ask your health care provider.  Keep all follow-up visits as told by your health care provider. This is important. Contact a health care provider if:  You are struggling to maintain a healthy diet or weight.  You need help to  start on an exercise program.  You need help to stop smoking. Get help right away if:  You have chest pain.  You have trouble breathing. This information is not intended to replace advice given to you by your health care provider. Make sure you discuss any questions you have with your health care provider. Document Released: 03/13/2005 Document Revised: 10/09/2015 Document Reviewed: 09/11/2015 Elsevier Interactive Patient Education  2017 [employer].

## 2016-04-21 NOTE — Progress Notes (Signed)
Subjective:    Patient ID: Gilbert Harris, male    DOB: November 30, 1945, 71 y.o.   MRN: [id number] [name], a 71 year old patient with hypertension and hyperlipidemia presents for a 6 month follow up.  Patient states that he was seen by cardiologist and had an EKG performed 6 months ago. Reports that he is taking medications consistently and is feeling good. Reports that he has changed diet and started a regular exercise routine Patient currently denies shortness of breath, dizziness, edema, claudication, chest pains, or nausea.   Hypertension  This is a chronic problem. The problem is controlled. Pertinent negatives include no blurred vision, chest pain, headaches, malaise/fatigue, neck pain, orthopnea, palpitations, peripheral edema, PND, shortness of breath or sweats. Risk factors for coronary artery disease include male gender. The current treatment provides moderate improvement. There are no compliance problems.  There is no history of angina, kidney disease, CAD/MI, CVA, heart failure, left ventricular hypertrophy or retinopathy.  Hyperlipidemia  This is a chronic problem. The problem is controlled. Recent lipid tests were reviewed and are normal. Associated symptoms include leg pain. Pertinent negatives include no chest pain, focal sensory loss, focal weakness, myalgias or shortness of breath. Current antihyperlipidemic treatment includes statins. The current treatment provides significant improvement of lipids. There are no compliance problems.  There are no known risk factors for coronary artery disease.    Immunization History  Administered Date(s) Administered  . Influenza Whole 12/22/2010  . Influenza,inj,Quad PF,36+ Mos 01/14/2015, 01/14/2016  . Pneumococcal Conjugate-13 01/14/2016  . Pneumococcal Polysaccharide-23 02/12/2014  . Tdap 07/10/2013  . Zoster 02/12/2014   Past Medical History:  Diagnosis Date  . Carotid stenosis 01/05/2016  . Coronary artery disease    CABG x4  LIMA to LAD, SV graft to RCA, sequential saphenous vein graft to diagnal and OM1  . HTN (hypertension)   . Hyperlipidemia   . MI (myocardial infarction)   . Schizophrenia (HCC)    Allergies as of 04/21/2016      Reactions   Haloperidol Decanoate Shortness Of Breath      Medication List       Accurate as of 04/21/16 10:20 AM. Always use your most recent med list.          aspirin EC 81 MG EC tablet Generic drug:  aspirin Take 81 mg by mouth daily. Swallow whole.   atorvastatin 20 MG tablet Commonly known as:  LIPITOR Take 1 tablet (20 mg total) by mouth daily.   busPIRone 5 MG tablet Commonly known as:  BUSPAR Take 1 tablet (5 mg total) by mouth 2 (two) times daily.   hydroxypropyl methylcellulose / hypromellose 2.5 % ophthalmic solution Commonly known as:  ISOPTO TEARS / GONIOVISC Place 1 drop into both eyes 4 (four) times daily as needed for dry eyes.   LORazepam 1 MG tablet Commonly known as:  ATIVAN Take 1 mg by mouth daily.   losartan 100 MG tablet Commonly known as:  COZAAR Take 1 tablet (100 mg total) by mouth daily.   metoprolol 50 MG tablet Commonly known as:  LOPRESSOR Take 1 tablet by mouth two  times daily   multivitamin tablet Take 1 tablet by mouth daily.   NITROSTAT 0.4 MG SL tablet Generic drug:  nitroGLYCERIN Dissolve 1 tablet under the tongue as needed for chest  pain every 5 minutes   thioridazine 25 MG tablet Commonly known as:  MELLARIL Take 1 tablet (25 mg total) by mouth 2 (two) times daily.  Vitamin D (Cholecalciferol) 400 units Chew Chew 400 Units by mouth daily.      Review of Systems  Constitutional: Negative.  Negative for fatigue, fever, malaise/fatigue and unexpected weight change.  HENT: Negative.   Eyes: Negative.  Negative for blurred vision.  Respiratory: Negative.  Negative for shortness of breath.   Cardiovascular: Negative.  Negative for chest pain, palpitations, orthopnea and PND.  Gastrointestinal: Negative.   Negative for diarrhea, nausea and rectal pain.  Endocrine: Negative.  Negative for polydipsia, polyphagia and polyuria.  Genitourinary: Negative.   Musculoskeletal: Negative.  Negative for myalgias and neck pain.  Skin: Negative.   Allergic/Immunologic: Negative.   Neurological: Negative for dizziness, focal weakness, syncope, weakness and headaches.  Hematological: Negative.   Psychiatric/Behavioral: Negative.    BP (!) 126/98 (BP Location: Right Arm, Patient Position: Sitting, Cuff Size: Normal)   Pulse 68   Temp 97.7 F (36.5 C) (Oral)   Resp 16   Ht 6' (1.829 m)   Wt 218 lb (98.9 kg)   SpO2 100%   BMI 29.57 kg/m     Objective:   Physical Exam  Constitutional: He is oriented to person, place, and time. He appears well-developed and well-nourished.  HENT:  Head: Normocephalic and atraumatic.  Eyes: Pupils are equal, round, and reactive to light.  Neck: Normal range of motion. Neck supple.  Cardiovascular: Normal rate, regular rhythm and normal heart sounds.   Pulmonary/Chest: Effort normal and breath sounds normal.  Abdominal: Soft. Bowel sounds are normal.  Musculoskeletal: Normal range of motion.  Neurological: He is alert and oriented to person, place, and time.  Skin: Skin is warm and dry.  Psychiatric: He has a normal mood and affect. His behavior is normal. Judgment and thought content normal.    BP (!) 126/98 (BP Location: Right Arm, Patient Position: Sitting, Cuff Size: Normal)   Pulse 68   Temp 97.7 F (36.5 C) (Oral)   Resp 16   Ht 6' (1.829 m)   Wt 218 lb (98.9 kg)   SpO2 100%   BMI 29.57 kg/m      Assessment & Plan:   1. Essential hypertension Blood pressure is at goal on current medication regimen. Will continue medications as previously prescribed. No proteinuria present.  Patient taking a Yoga and Bettter Balance course, at [employer] 3 times per week. Patient is currently drinks supplemental shakes and meals to maintain weight. Reports that  he drinks 4-5 glasses of water per day. - COMPLETE METABOLIC PANEL WITH GFR  2. Prediabetes Recommend a lowfat, low carbohydrate diet divided over 5-6 small meals, increase water intake to 6-8 glasses, and 150 minutes per week of cardiovascular exercise.   - HgB A1c  3. Need for hepatitis C screening test - Hepatitis panel, acute  4. HYPERCHOLESTEROLEMIA - Lipid Panel  Preventative Care:  Cardiologist: Heart and cholesterol.  Dr. Brunilda PayorNesi; Urologist, Digital prostate, 1 year ago  Colonoscopy was 1 year ago; Dr. Jeani HawkingPatrick Hung   Patient will be [facility] due to recent changes insurance. Will not schedule a return visit.   The patient was given clear instructions to go to ER or return to medical center if symptoms do not improve, worsen or new problems develop. The patient verbalized understanding. Will notify patient with laboratory results.  Massie MaroonHollis,Kylan Liberati M, FNP

## 2016-04-22 LAB — HEPATITIS PANEL, ACUTE
HCV Ab: NEGATIVE
Hep A IgM: NONREACTIVE
Hep B C IgM: NONREACTIVE
Hepatitis B Surface Ag: NEGATIVE

## 2016-04-22 LAB — COMPLETE METABOLIC PANEL WITH GFR
ALT: 22 U/L (ref 9–46)
AST: 23 U/L (ref 10–35)
Albumin: 4.2 g/dL (ref 3.6–5.1)
Alkaline Phosphatase: 67 U/L (ref 40–115)
BUN: 10 mg/dL (ref 7–25)
CO2: 25 mmol/L (ref 20–31)
Calcium: 9.6 mg/dL (ref 8.6–10.3)
Chloride: 102 mmol/L (ref 98–110)
Creat: 1.3 mg/dL — ABNORMAL HIGH (ref 0.70–1.18)
GFR, Est African American: 64 mL/min (ref >=60)
GFR, Est Non African American: 55 mL/min — ABNORMAL LOW (ref >=60)
Glucose, Bld: 132 mg/dL — ABNORMAL HIGH (ref 65–99)
Potassium: 4.7 mmol/L (ref 3.5–5.3)
Sodium: 137 mmol/L (ref 135–146)
Total Bilirubin: 0.8 mg/dL (ref 0.2–1.2)
Total Protein: 7 g/dL (ref 6.1–8.1)

## 2016-04-22 LAB — LIPID PANEL
Cholesterol: 111 mg/dL (ref <200)
HDL: 48 mg/dL (ref >40)
LDL Cholesterol: 46 mg/dL (ref <100)
Total CHOL/HDL Ratio: 2.3 Ratio (ref <5.0)
Triglycerides: 86 mg/dL (ref <150)
VLDL: 17 mg/dL (ref <30)

## 2016-06-01 IMAGING — CT CT RENAL STONE PROTOCOL
2 of 4 series · 5 of 46 positions shown, 7 images · non-contrast
Comparison: MR liver 06/28/2013

CLINICAL DATA: Hematuria, no pain

EXAM:
CT ABDOMEN AND PELVIS WITHOUT CONTRAST
TECHNIQUE: Multidetector CT imaging of the abdomen and pelvis was performed
following the standard protocol without IV contrast.

[Series 204: coronal · coronal · 0.45mm/px · 4 of 120 slices shown, 5 images]
[im 27/120  soft-tissue]
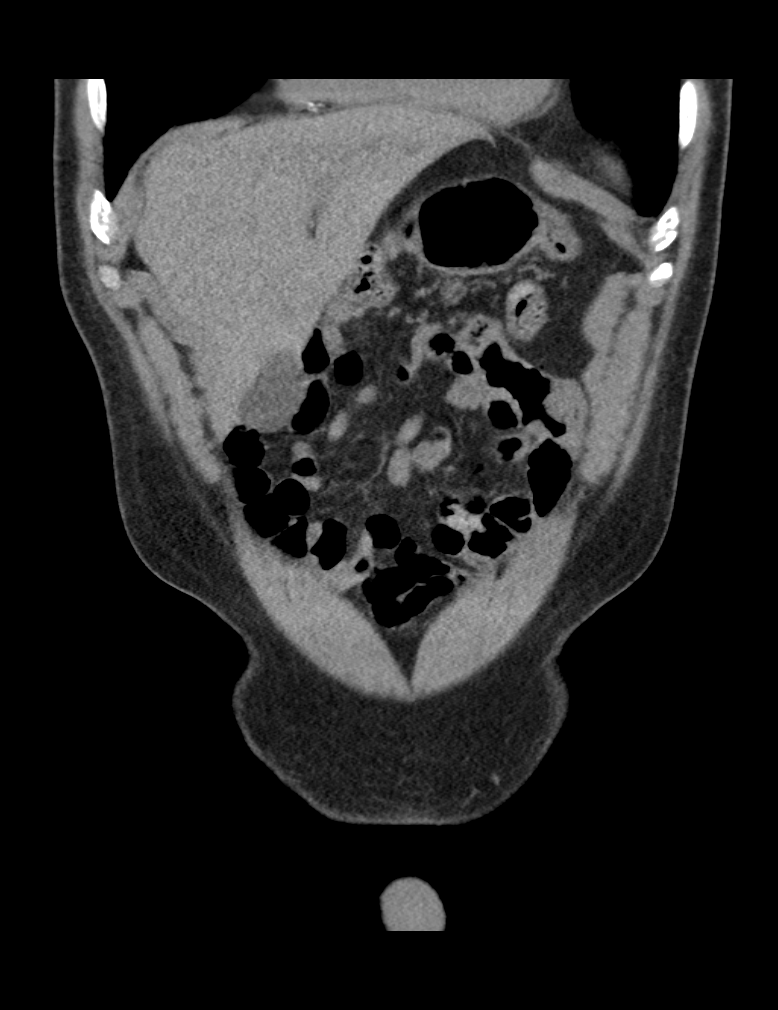
[im 27/120  bone]
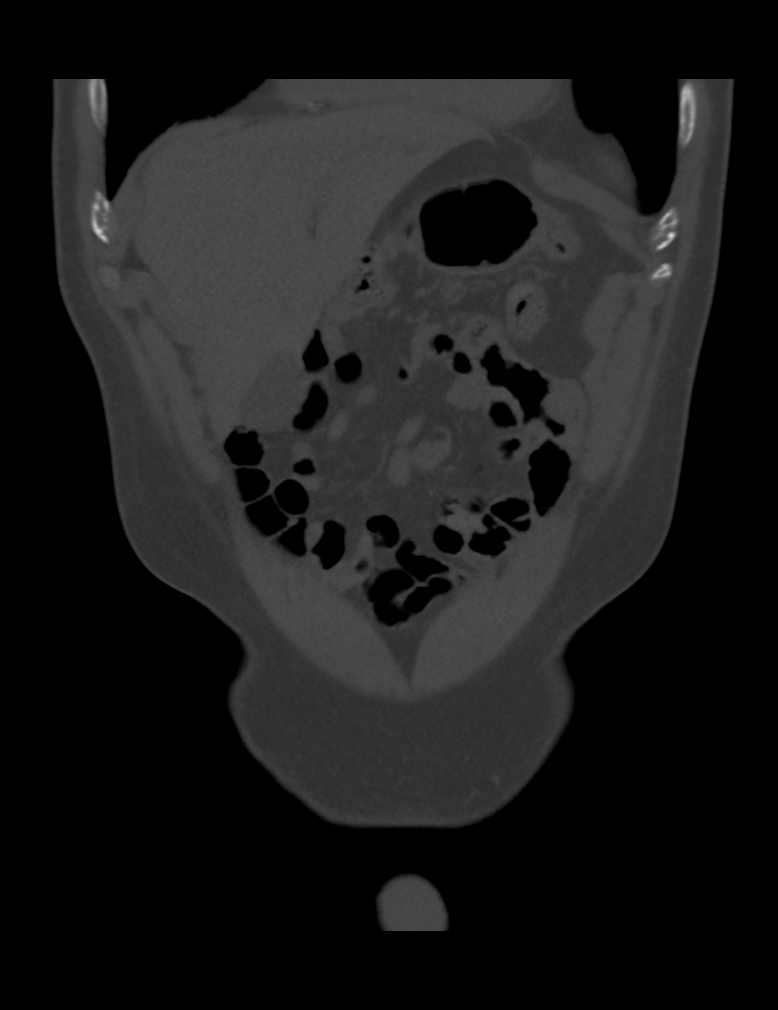
[im 53/120  soft-tissue]
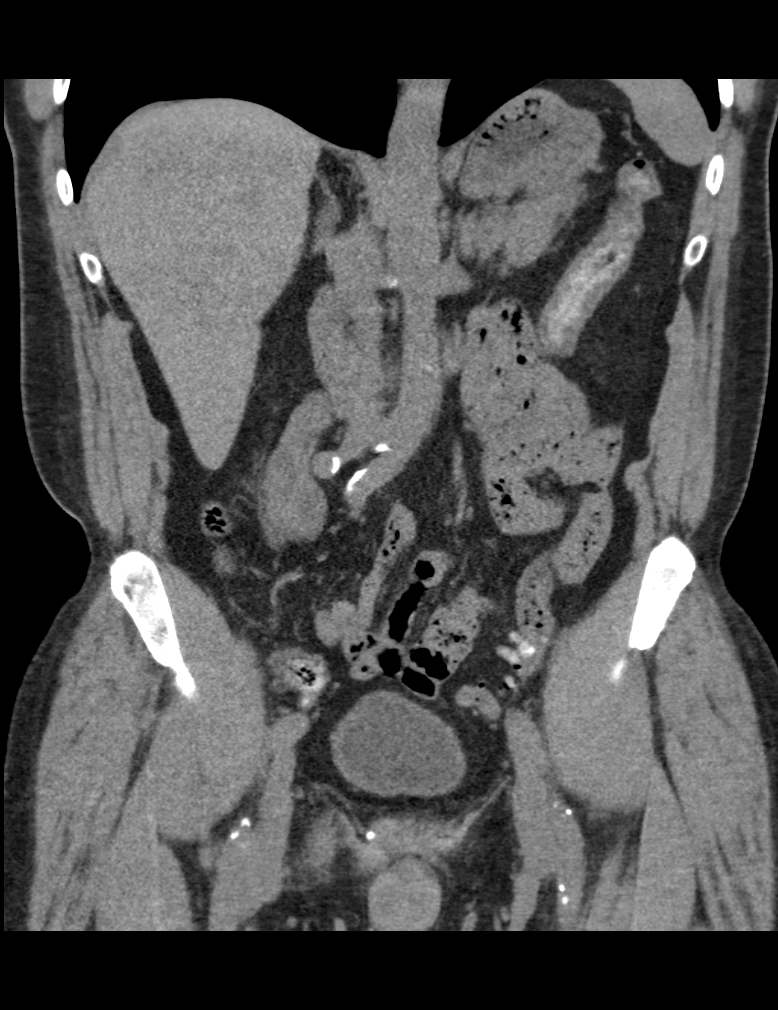
[im 80/120  soft-tissue]
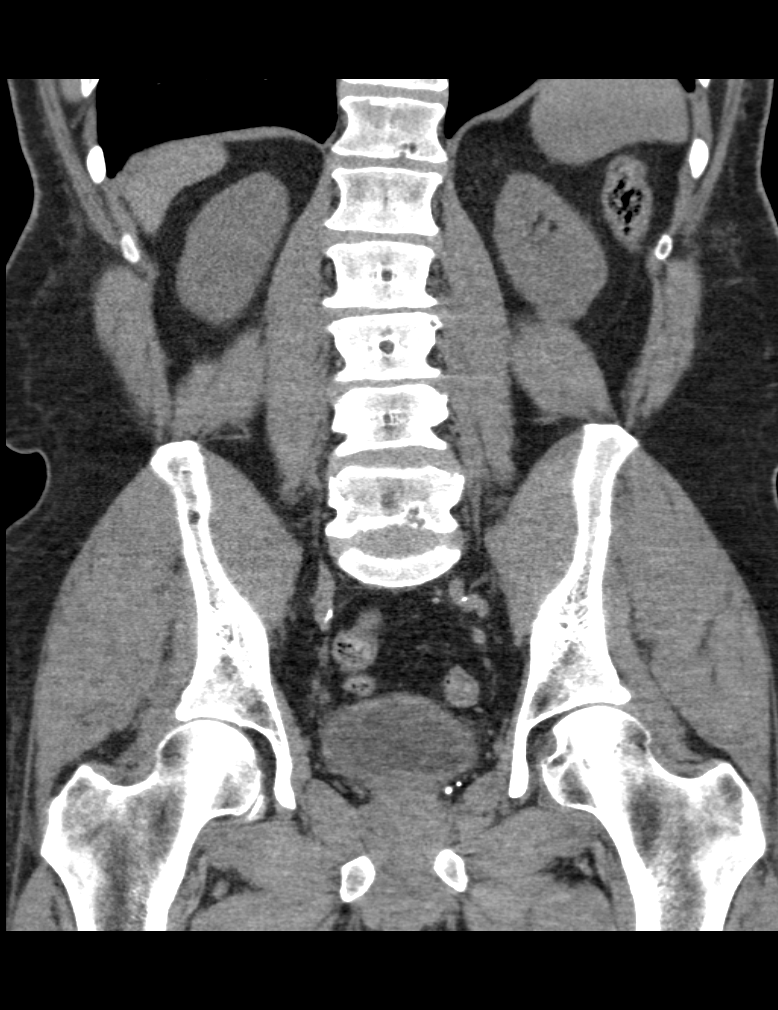
[im 106/120  soft-tissue]
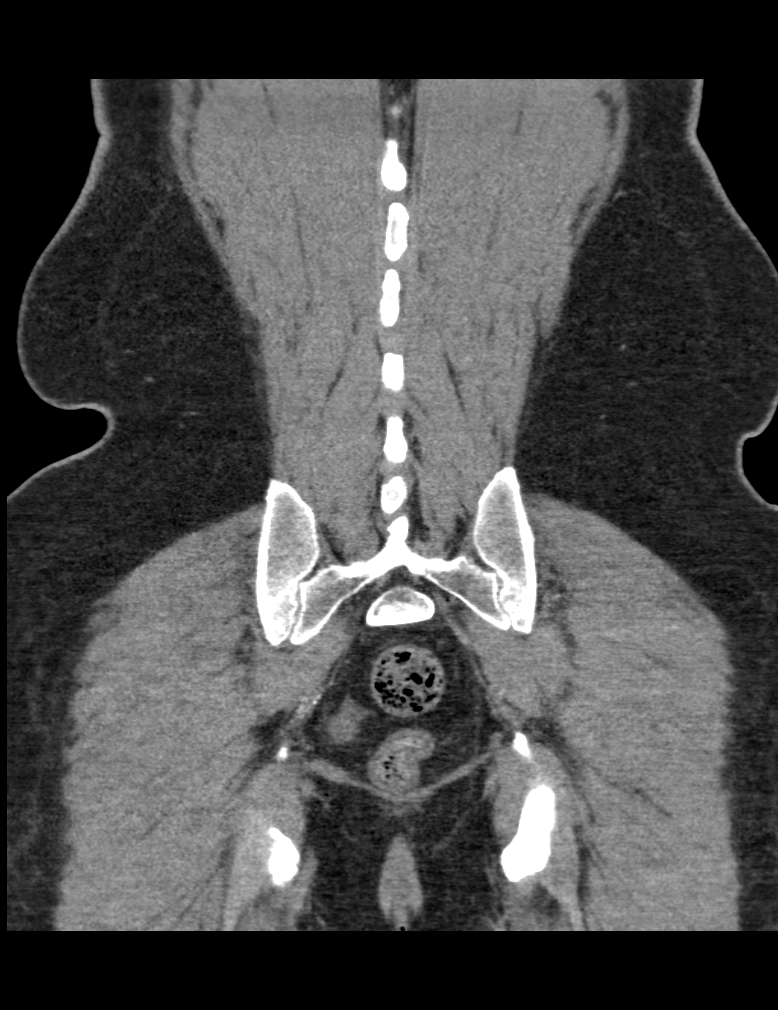

[Series 205: sagittal · sagittal · 0.45mm/px · 1 of 152 slices shown, 2 images]
[im 51/152  soft-tissue]
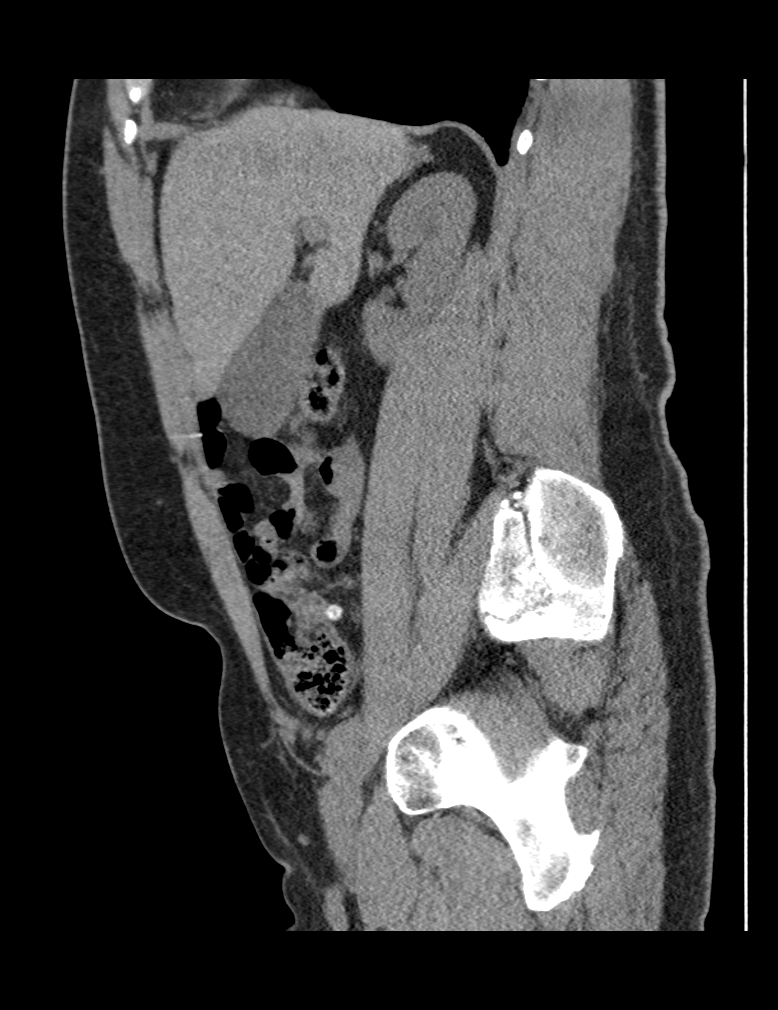
[im 51/152  bone]
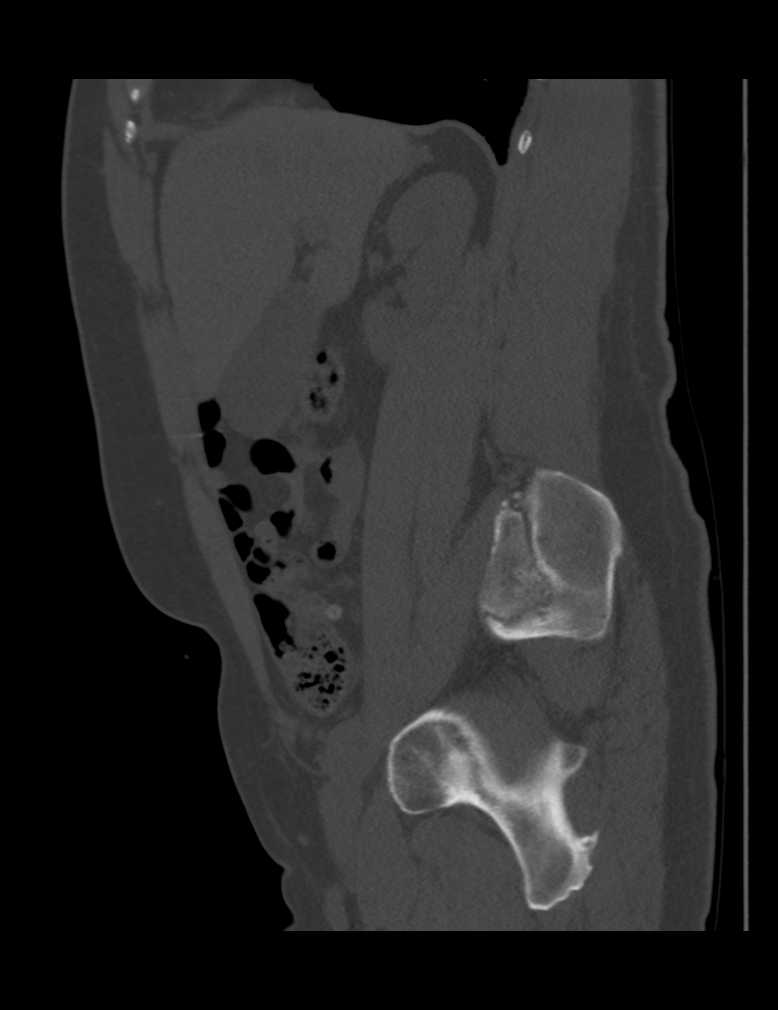

[5 of 46 positions shown; findings below may reference images not displayed]

FINDINGS: 4 mm nonobstructing stone right lower pole. No other renal stone. No
hydronephrosis or renal mass. Bladder wall thickening with mild
prostate enlargement.

Lung bases are clear

4 cm mass in the dome of the liver on the right posteriorly. This
has been demonstrated as hemangioma by liver. No other liver mass
lesion. Gallbladder and bile ducts normal. Pancreas and spleen
normal.

Negative for bowel obstruction or bowel thickening. Normal appendix.
Sigmoid diverticulosis.

Negative for free fluid.  No mass or adenopathy.

Schmorl's nodes L2-3 and L3-4.  No lumbar fracture
IMPRESSION: 4 mm nonobstructing stone right kidney. No other renal stone or
hydronephrosis

Prostate enlargement with bladder wall thickening

4 cm hemangioma in the liver is stable from prior MRI.

Sigmoid diverticulosis.

## 2016-07-02 NOTE — Progress Notes (Signed)
Cardiology Office Note   Date:  07/03/2016   ID:  Gilbert Harris, DOB 1945/04/11, MRN [id number]  PCP:  Massie Maroon, FNP  Cardiologist:   Chilton Si, MD   Chief Complaint  Patient presents with  . Follow-up    Pt states no Sx.       History of Present Illness: Gilbert Harris is a 71 y.o. male with hypertension, hyperlipidemia, CAD s/p MI and CABG in 2007, mild carotid stenosis, syncope, and schizophrenia who presents for follow up.  Mr. Furches was previously a patient of Dr. Patty Sermons. He has been doing very well.  He continues to exercise twice per week.  He does mostly chair exercises and lifts light weights. He has no chest pain or shortness of breath with this activity.  He also denies lower extremity edema, orthopnea or PND.  At his last appointment Mr. Attridge blood pressure was elevated.  Metoprolol was increased.  He thinks that his blood pressure has been better-controlled.  He recently ran out of his metoprolol and has not yet taken it today. He called and a refill and it is on its way to his home at this time.  Overall he is well and without complaint at this time   Past Medical History:  Diagnosis Date  . Carotid stenosis 01/05/2016  . Coronary artery disease    CABG x4 LIMA to LAD, SV graft to RCA, sequential saphenous vein graft to diagnal and OM1  . HTN (hypertension)   . Hyperlipidemia   . MI (myocardial infarction)   . Schizophrenia [facility])     Past Surgical History:  Procedure Laterality Date  . CARDIAC CATHETERIZATION     EF 50%--severe two vessel obstructive atherosclerotic coronary artery disease--Low normal left ventricle funcrtion  . CORONARY ARTERY BYPASS GRAFT  2007  . HERNIA REPAIR    . TONSILLECTOMY AND ADENOIDECTOMY Bilateral y-59     Current Outpatient Prescriptions  Medication Sig Dispense Refill  . aspirin (ASPIRIN EC) 81 MG EC tablet Take 81 mg by mouth daily. Swallow whole.    Marland Kitchen atorvastatin (LIPITOR) 20 MG tablet Take 1 tablet (20 mg  total) by mouth daily. 90 tablet 3  . busPIRone (BUSPAR) 5 MG tablet Take 1 tablet (5 mg total) by mouth 2 (two) times daily. 60 tablet 7  . hydroxypropyl methylcellulose / hypromellose (ISOPTO TEARS / GONIOVISC) 2.5 % ophthalmic solution Place 1 drop into both eyes 4 (four) times daily as needed for dry eyes.    Marland Kitchen LORazepam (ATIVAN) 1 MG tablet Take 1 mg by mouth daily.    Marland Kitchen losartan (COZAAR) 100 MG tablet Take 1 tablet (100 mg total) by mouth daily. 90 tablet 3  . metoprolol tartrate (LOPRESSOR) 50 MG tablet Take 1 tablet by mouth two  times daily 180 tablet 3  . Multiple Vitamin (MULTIVITAMIN) tablet Take 1 tablet by mouth daily.      Marland Kitchen NITROSTAT 0.4 MG SL tablet Dissolve 1 tablet under the tongue as needed for chest  pain every 5 minutes 75 tablet 3  . thioridazine (MELLARIL) 25 MG tablet Take 1 tablet (25 mg total) by mouth 2 (two) times daily. 60 tablet 7  . Vitamin D, Cholecalciferol, 400 units CHEW Chew 400 Units by mouth daily.     No current facility-administered medications for this visit.     Allergies:   Haloperidol decanoate    Social History:  The patient  reports that he quit smoking about 10 years ago. His smoking  use included Cigarettes. He has never used smokeless tobacco. He reports that he does not drink alcohol or use drugs.   Family History:  The patient's family history includes Heart attack in his father and mother; Heart disease in his father, maternal grandmother, and mother; Heart failure in his maternal aunt; Stroke in his maternal grandfather.    ROS:  Please see the history of present illness.   Otherwise, review of systems are positive for none.   All other systems are reviewed and negative.    PHYSICAL EXAM: VS:  BP 128/89   Pulse 74   Ht 6' (1.829 m)   Wt 97.5 kg (215 lb)   BMI 29.16 kg/m  , BMI Body mass index is 29.16 kg/m. GENERAL:  Well appearing HEENT:  Pupils equal round and reactive, fundi not visualized, oral mucosa unremarkable NECK:  No  jugular venous distention, waveform within normal limits, carotid upstroke brisk and symmetric, +R carotid bruit LYMPHATICS:  No cervical adenopathy LUNGS:  Clear to auscultation bilaterally HEART:  RRR.  PMI not displaced or sustained,S1 and S2 within normal limits, no S3, no S4, no clicks, no rubs, no murmurs ABD:  Flat, positive bowel sounds normal in frequency in pitch, no bruits, no rebound, no guarding, no midline pulsatile mass, no hepatomegaly, no splenomegaly EXT:  2 plus pulses throughout, no edema, no cyanosis no clubbing SKIN:  No rashes no nodules NEURO:  Cranial nerves II through XII grossly intact, motor grossly intact throughout PSYCH:  Cognitively intact, oriented to person place and time   EKG:  EKG is  ordered today. The ekg ordered 01/05/16 demonstrates sinus rhythm. Rate 64 bpm. nonspecific T wave abnormalities. 07/03/16: Sinus rhythm.  Rate 74 bpm.  Lateral T wave abnormalities  Recent Labs: 01/14/2016: Hemoglobin 15.4; Platelets 186 04/21/2016: ALT 22; BUN 10; Creat 1.30; Potassium 4.7; Sodium 137   Carotid Doppler 07/2015: 1-39% stenosis bilaterally   Lipid Panel    Component Value Date/Time   CHOL 111 04/21/2016 1032   TRIG 86 04/21/2016 1032   HDL 48 04/21/2016 1032   CHOLHDL 2.3 04/21/2016 1032   VLDL 17 04/21/2016 1032   LDLCALC 46 04/21/2016 1032      Wt Readings from Last 3 Encounters:  07/03/16 97.5 kg (215 lb)  04/21/16 98.9 kg (218 lb)  03/31/16 97.1 kg (214 lb)      ASSESSMENT AND PLAN:  # Hypertension:  Diastolic blood pressure slightly above goal. He should be less than 130/80. However, he has not yet taken his metoprolol today. He also recently reordered a 3 month supply. We'll have him come back in 4 months and recheck. If it remains elevated we will switch metoprolol to carvedilol at that time. Continue Tylenol and losartan for now.  #  CAD s/p CABG: Currently asymptomatic.   Continue aspirin, metoprolol, and atorvastatin. He was  congratulated on his exercise.   # Hyperlipidemia: LDL 46 03/2016.  Goal is <70.  Continue atorvastatin.   # Carotid stenosis: Mild bilateral carotid stenosis 07/2015.  Continue aspirin and atorvastatin.     Current medicines are reviewed at length with the patient today.  The patient does not have concerns regarding medicines.  The following changes have been made:  no change  Labs/ tests ordered today include:  No orders of the defined types were placed in this encounter.    Disposition:   FU with Floye Fesler C. Duke Salvia, MD, [facility] in  4 months.    This note was written with the  assistance of speech recognition software.  Please excuse any transcriptional errors.  Signed, Tj Kitchings C. Duke Salvia, MD, [facility]  07/03/2016 9:23 AM    [facility]

## 2016-07-03 ENCOUNTER — Ambulatory Visit (INDEPENDENT_AMBULATORY_CARE_PROVIDER_SITE_OTHER): Payer: Medicare Other | Admitting: Cardiovascular Disease

## 2016-07-03 ENCOUNTER — Encounter: Payer: Self-pay | Admitting: Cardiovascular Disease

## 2016-07-03 VITALS — BP 128/89 | HR 74 | Ht 72.0 in | Wt 215.0 lb

## 2016-07-03 DIAGNOSIS — I119 Hypertensive heart disease without heart failure: Secondary | ICD-10-CM

## 2016-07-03 DIAGNOSIS — Z951 Presence of aortocoronary bypass graft: Secondary | ICD-10-CM

## 2016-07-03 DIAGNOSIS — E7800 Pure hypercholesterolemia, unspecified: Secondary | ICD-10-CM | POA: Diagnosis not present

## 2016-07-03 DIAGNOSIS — I6523 Occlusion and stenosis of bilateral carotid arteries: Secondary | ICD-10-CM | POA: Diagnosis not present

## 2016-07-03 NOTE — Patient Instructions (Signed)
Medication Instructions:  Your physician recommends that you continue on your current medications as directed. Please refer to the Current Medication list given to you today.  Labwork: NONE  Testing/Procedures: NONE  Follow-Up: Your physician wants you to follow-up in: 4 MONTH OV You will receive a reminder letter in the mail two months in advance. If you don't receive a letter, please call our office to schedule the follow-up appointment.  If you need a refill on your cardiac medications before your next appointment, please call your pharmacy.  

## 2016-08-30 ENCOUNTER — Encounter (HOSPITAL_COMMUNITY): Payer: Self-pay | Admitting: Psychiatry

## 2016-08-30 ENCOUNTER — Ambulatory Visit (INDEPENDENT_AMBULATORY_CARE_PROVIDER_SITE_OTHER): Payer: Medicare Other | Admitting: Psychiatry

## 2016-08-30 VITALS — BP 108/66 | HR 66 | Ht 72.0 in | Wt 213.0 lb

## 2016-08-30 DIAGNOSIS — Z87891 Personal history of nicotine dependence: Secondary | ICD-10-CM

## 2016-08-30 DIAGNOSIS — F200 Paranoid schizophrenia: Secondary | ICD-10-CM | POA: Diagnosis not present

## 2016-08-30 MED ORDER — THIORIDAZINE HCL 25 MG PO TABS: ORAL_TABLET | ORAL | 7 refills | Status: DC

## 2016-08-30 MED ORDER — BUSPIRONE HCL 5 MG PO TABS: 5.0000 mg | ORAL_TABLET | Freq: Two times a day (BID) | ORAL | 7 refills | Status: DC

## 2016-08-30 NOTE — Progress Notes (Signed)
Patient ID: Gilbert Harris, male   DOB: 1945/10/09, 71 y.o.   MR[facility]: [id number] [facility]  08/30/2016 1:28 PM Gilbert Harris  MRN:  [id number] Subjective: Doing well Principal Problem: Schizophrenia residual Diagnosis:  Schizophrenia residual Today the patient is seen on time. He is doing very well. He is well dressed. Is calm and pleasant is engaging. Today he brought in 2 pieces of art work to put up in our room today the patient denies depression. Denies any anxiety. The patient is sleeping and eating well. Patient has good energy. Medically he is stable. Is on 2 blood pressure medicines and his blood pressure is fairly well controlled. He demonstrates no evidence of psychosis. Is active he is functioning well. He drinks no alcohol uses no illicit drugs. Clarify these taking BuSpar twice a day and is actually not taking any more Ativan. He shows no evidence of EPS. He shows no evidence of tardive dyskinesia. His thinking is clear and organized. He shows no evidence of a thought disorder or any forms of psychosis. [place].  Past Psychiatric History:   Past Medical History:  Past Medical History:  Diagnosis Date  . Carotid stenosis 01/05/2016  . Coronary artery disease    CABG x4 LIMA to LAD, SV graft to RCA, sequential saphenous vein graft to diagnal and OM1  . HTN (hypertension)   . Hyperlipidemia   . MI (myocardial infarction) (HCC)   . Schizophrenia [facility](HCC)     Past Surgical History:  Procedure Laterality Date  . CARDIAC CATHETERIZATION     EF 50%--severe two vessel obstructive atherosclerotic coronary artery disease--Low normal left ventricle funcrtion  . CORONARY ARTERY BYPASS GRAFT  2007  . HERNIA REPAIR    . TONSILLECTOMY AND ADENOIDECTOMY Bilateral y-59   Family History:  Family History  Problem Relation Age of Onset  . Heart disease Mother   . Heart attack Mother   . Heart disease Father   . Heart attack Father   . Heart disease  Maternal Grandmother   . Stroke Maternal Grandfather   . Heart failure Maternal Aunt    Family Psychiatric  History:  Social History:  History  Alcohol Use No     History  Drug Use No    Social History   Social History  . Marital status: Single    Spouse name: N/A  . Number of children: N/A  . Years of education: N/A   Social History Main Topics  . Smoking status: Former Smoker    Types: Cigarettes    Quit date: 02/03/2006  . Smokeless tobacco: Never Used  . Alcohol use No  . Drug use: No  . Sexual activity: Not Currently   Other Topics Concern  . None   Social History Narrative  . None   Additional Social History:                         Sleep: Good  Appetite:  Good  Current Medications: Current Outpatient Prescriptions  Medication Sig Dispense Refill  . aspirin (ASPIRIN EC) 81 MG EC tablet Take 81 mg by mouth daily. Swallow whole.    Marland Kitchen. atorvastatin (LIPITOR) 20 MG tablet Take 1 tablet (20 mg total) by mouth daily. 90 tablet 3  . busPIRone (BUSPAR) 5 MG tablet Take 1 tablet (5 mg total) by mouth 2 (two) times daily. 60 tablet 7  . hydroxypropyl methylcellulose / hypromellose (ISOPTO TEARS / GONIOVISC) 2.5 %  ophthalmic solution Place 1 drop into both eyes 4 (four) times daily as needed for dry eyes.    Marland Kitchen losartan (COZAAR) 100 MG tablet Take 1 tablet (100 mg total) by mouth daily. 90 tablet 3  . metoprolol tartrate (LOPRESSOR) 50 MG tablet Take 1 tablet by mouth two  times daily 180 tablet 3  . Multiple Vitamin (MULTIVITAMIN) tablet Take 1 tablet by mouth daily.      Marland Kitchen NITROSTAT 0.4 MG SL tablet Dissolve 1 tablet under the tongue as needed for chest  pain every 5 minutes 75 tablet 3  . thioridazine (MELLARIL) 25 MG tablet 1  qhs 30 tablet 7  . Vitamin D, Cholecalciferol, 400 units CHEW Chew 400 Units by mouth daily.     No current facility-administered medications for this visit.     Lab Results: No results found for this or any previous visit (from  the past 48 hour(s)).  Physical Findings: AIMS:  , ,  ,  ,    CIWA:    COWS:     Musculoskeletal: Strength & Muscle Tone: within normal limits Gait & Station: normal Patient leans:  Psychiatric Specialty Exam: ROS  Blood pressure 108/66, pulse 66, height 6' (1.829 m), weight 213 lb (96.6 kg).Body mass index is 28.89 kg/m.  General Appearance: Meticulous  Eye Contact::  Good  Speech:  Clear and Coherent  Volume:  Normal  Mood:  Euthymic  Affect:  Appropriate  Thought Process:  Coherent  Orientation:  Full (Time, Place, and Person)  Thought Content:  WDL  Suicidal Thoughts:  No  Homicidal Thoughts:  No  Memory:  NA  Judgement:  NA  Insight:  Good  Psychomotor Activity:  Normal  Concentration:  Good  Recall:  Good  Fund of Knowledge:Good  Language: Good  Akathisia:  No  Handed:  Right  AIMS (if indicated):     Assets:  Communication Skills  ADL's:  Intact  Cognition: WNL  Sleep:      Treatment Plan Summary: 08/30/2016, 1:28 PM   Today the patient is doing very well. His mood is good. He shows no signs of psychosis. He shows no side effects from his Mellaril 25 mg at night and his BuSpar 5 mg twice a day. The patient continue these medications. Is being followed closely by his cardiologist and primary care doctor. The patient denies chest pain or shortness of breath. He denies any neurological symptoms return to see me in 5 months.

## 2016-11-07 NOTE — Progress Notes (Signed)
Cardiology Office Note   Date:  11/08/2016   ID:  Gilbert DivineWilliam E Job, DOB 1945-10-07, MRN [id number]  PCP:  Massie MaroonHollis, Lachina M, FNP  Cardiologist:   Chilton Siiffany Lavelle, MD   No chief complaint on file.     History of Present Illness: Gilbert Harris is a 71 y.o. male with hypertension, hyperlipidemia, CAD s/p MI and CABG in 2007, mild carotid stenosis, syncope, and schizophrenia who presents for follow up.  At his last appointment [name]'s blood pressure was elevated.  However he had not taken his medication yet that day. Since his last appointment [name] has been doing well.  He continues to exercise regularly. He has no chest pain or shortness of breath with this activity. He also denies lower extremity edema, orthopnea, or PND. He has not noted any palpitations, lightheadedness, or dizziness. He does not check his blood pressure regularly at home.  Past Medical History:  Diagnosis Date  . Carotid stenosis 01/05/2016  . Coronary artery disease    CABG x4 LIMA to LAD, SV graft to RCA, sequential saphenous vein graft to diagnal and OM1  . HTN (hypertension)   . Hyperlipidemia   . MI (myocardial infarction) (HCC)   . Schizophrenia [facility](HCC)     Past Surgical History:  Procedure Laterality Date  . CARDIAC CATHETERIZATION     EF 50%--severe two vessel obstructive atherosclerotic coronary artery disease--Low normal left ventricle funcrtion  . CORONARY ARTERY BYPASS GRAFT  2007  . HERNIA REPAIR    . TONSILLECTOMY AND ADENOIDECTOMY Bilateral y-59     Current Outpatient Prescriptions  Medication Sig Dispense Refill  . aspirin (ASPIRIN EC) 81 MG EC tablet Take 81 mg by mouth daily. Swallow whole.    Marland Kitchen. atorvastatin (LIPITOR) 20 MG tablet Take 1 tablet (20 mg total) by mouth daily. 90 tablet 3  . busPIRone (BUSPAR) 5 MG tablet Take 1 tablet (5 mg total) by mouth 2 (two) times daily. 60 tablet 7  . hydroxypropyl methylcellulose / hypromellose (ISOPTO TEARS / GONIOVISC) 2.5 % ophthalmic  solution Place 1 drop into both eyes 4 (four) times daily as needed for dry eyes.    Marland Kitchen. losartan-hydrochlorothiazide (HYZAAR) 100-25 MG tablet Take 1 tablet by mouth daily.    . metoprolol tartrate (LOPRESSOR) 50 MG tablet Take 1 tablet by mouth two  times daily 180 tablet 3  . Multiple Vitamin (MULTIVITAMIN) tablet Take 1 tablet by mouth daily.      Marland Kitchen. NITROSTAT 0.4 MG SL tablet Dissolve 1 tablet under the tongue as needed for chest  pain every 5 minutes 75 tablet 3  . thioridazine (MELLARIL) 25 MG tablet 1  qhs 30 tablet 7  . Vitamin D, Cholecalciferol, 400 units CHEW Chew 400 Units by mouth daily.     No current facility-administered medications for this visit.     Allergies:   Haloperidol decanoate    Social History:  The patient  reports that he quit smoking about 10 years ago. His smoking use included Cigarettes. He has never used smokeless tobacco. He reports that he does not drink alcohol or use drugs.   Family History:  The patient's family history includes Heart attack in his father and mother; Heart disease in his father, maternal grandmother, and mother; Heart failure in his maternal aunt; Stroke in his maternal grandfather.    ROS:  Please see the history of present illness.   Otherwise, review of systems are positive for none.   All other systems are reviewed  and negative.    PHYSICAL EXAM: VS:  BP 120/82   Pulse 72   Ht 6' (1.829 m)   Wt 95 kg (209 lb 6.4 oz)   BMI 28.40 kg/m  , BMI Body mass index is 28.4 kg/m. GENERAL:  Well appearing.  No acute distress. HEENT: Pupils equal round and reactive, fundi not visualized, oral mucosa unremarkable NECK:  No jugular venous distention, waveform within normal limits, carotid upstroke brisk and symmetric LUNGS:  Clear to auscultation bilaterally HEART:  RRR.  PMI not displaced or sustained,S1 and S2 within normal limits, no S3, no S4, no clicks, no rubs, no murmurs ABD:  Flat, positive bowel sounds normal in frequency in pitch,  no bruits, no rebound, no guarding, no midline pulsatile mass, no hepatomegaly, no splenomegaly EXT:  2 plus pulses throughout, no edema, no cyanosis no clubbing SKIN:  No rashes no nodules NEURO:  Cranial nerves II through XII grossly intact, motor grossly intact throughout PSYCH:  Cognitively intact, oriented to person place and time   EKG:  EKG is not ordered today. The ekg ordered 01/05/16 demonstrates sinus rhythm. Rate 64 bpm. nonspecific T wave abnormalities. 07/03/16: Sinus rhythm.  Rate 74 bpm.  Lateral T wave abnormalities  Recent Labs: 01/14/2016: Hemoglobin 15.4; Platelets 186 04/21/2016: ALT 22; BUN 10; Creat 1.30; Potassium 4.7; Sodium 137   Carotid Doppler 07/2015: 1-39% stenosis bilaterally   Lipid Panel    Component Value Date/Time   CHOL 111 04/21/2016 1032   TRIG 86 04/21/2016 1032   HDL 48 04/21/2016 1032   CHOLHDL 2.3 04/21/2016 1032   VLDL 17 04/21/2016 1032   LDLCALC 46 04/21/2016 1032      Wt Readings from Last 3 Encounters:  11/08/16 95 kg (209 lb 6.4 oz)  07/03/16 97.5 kg (215 lb)  04/21/16 98.9 kg (218 lb)      ASSESSMENT AND PLAN:  # Hypertension:  Blood pressure is better-controlled. Ideally his diastolic would be less than 80. However it is close and his systolic is only 528, so we will not make any changes at this time. Continue losartan, hydrochlorothiazide, and metoprolol.  #  CAD s/p CABG: [name] continues to do well.  He exercises regularly and has no angina.  # Hyperlipidemia: LDL 46 03/2016.  Goal is <70.  Continue atorvastatin.  Repeat lipids at follow up.  # Carotid stenosis: Mild bilateral carotid stenosis 07/2015.  Continue aspirin and atorvastatin.   Current medicines are reviewed at length with the patient today.  The patient does not have concerns regarding medicines.  The following changes have been made:  no change  Labs/ tests ordered today include:  No orders of the defined types were placed in this  encounter.    Disposition:   FU with Barre Aydelott C. Duke Salvia, MD, [facility] in  6 months.    This note was written with the assistance of speech recognition software.  Please excuse any transcriptional errors.  Signed, Lyncoln Ledgerwood C. Duke Salvia, MD, [facility]  11/08/2016 9:00 AM    [facility]

## 2016-11-08 ENCOUNTER — Ambulatory Visit (INDEPENDENT_AMBULATORY_CARE_PROVIDER_SITE_OTHER): Payer: Medicare Other | Admitting: Cardiovascular Disease

## 2016-11-08 ENCOUNTER — Encounter: Payer: Self-pay | Admitting: Cardiovascular Disease

## 2016-11-08 VITALS — BP 120/82 | HR 72 | Ht 72.0 in | Wt 209.4 lb

## 2016-11-08 DIAGNOSIS — Z951 Presence of aortocoronary bypass graft: Secondary | ICD-10-CM | POA: Diagnosis not present

## 2016-11-08 DIAGNOSIS — I119 Hypertensive heart disease without heart failure: Secondary | ICD-10-CM | POA: Diagnosis not present

## 2016-11-08 DIAGNOSIS — E7800 Pure hypercholesterolemia, unspecified: Secondary | ICD-10-CM | POA: Diagnosis not present

## 2016-11-08 DIAGNOSIS — I6523 Occlusion and stenosis of bilateral carotid arteries: Secondary | ICD-10-CM

## 2016-11-08 NOTE — Patient Instructions (Signed)
We will see you again in 6 months.  There are no changes to your medications today.

## 2016-12-01 ENCOUNTER — Other Ambulatory Visit: Payer: Self-pay | Admitting: Cardiovascular Disease

## 2016-12-01 NOTE — Telephone Encounter (Signed)
Please review for refill. Thanks!  

## 2016-12-01 NOTE — Telephone Encounter (Signed)
Rx has been sent to the pharmacy electronically. ° °

## 2016-12-18 ENCOUNTER — Other Ambulatory Visit: Payer: Self-pay | Admitting: Nurse Practitioner

## 2017-01-26 ENCOUNTER — Encounter (HOSPITAL_COMMUNITY): Payer: Self-pay | Admitting: Psychiatry

## 2017-01-26 ENCOUNTER — Ambulatory Visit (INDEPENDENT_AMBULATORY_CARE_PROVIDER_SITE_OTHER): Payer: Medicare Other | Admitting: Psychiatry

## 2017-01-26 VITALS — BP 110/82 | HR 59 | Ht 72.0 in | Wt 212.2 lb

## 2017-01-26 DIAGNOSIS — F200 Paranoid schizophrenia: Secondary | ICD-10-CM

## 2017-01-26 DIAGNOSIS — Z87891 Personal history of nicotine dependence: Secondary | ICD-10-CM | POA: Diagnosis not present

## 2017-01-26 MED ORDER — BUSPIRONE HCL 5 MG PO TABS: 5.0000 mg | ORAL_TABLET | Freq: Two times a day (BID) | ORAL | 7 refills | Status: DC

## 2017-01-26 MED ORDER — THIORIDAZINE HCL 25 MG PO TABS: ORAL_TABLET | ORAL | 7 refills | Status: DC

## 2017-01-26 NOTE — Progress Notes (Signed)
Patient ID: Gilbert DivineWilliam E Cromartie, male   DOB: Oct 30, 1945, 71 y.o.   MR[id number] [facility]  01/26/2017 9:09 AM Gilbert DivineWilliam E Blystone  MRN:  [id number] Subjective: Doing well Principal Problem: Schizophrenia residual Diagnosis:  Schizophrenia residual Today the patient is doing very well. He is very well dressed. 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 The patient is clearer thinking he shows no evidence of psychosis. He's in no distress. He's functioning extremely well. He denies being depressed. He uses no alcohol uses no drugs. He takes his medicines just as prescribed. Today we spent more than 50% of the time going over his medications and counseling him. He is very involved in his art work and has a [employer] at his own home. He's reading a great deal. Is able to think and concentrate without problems. His thought process is great.  Past Psychiatric History:   Past Medical History:  Past Medical History:  Diagnosis Date  . Carotid stenosis 01/05/2016  . Coronary artery disease    CABG x4 LIMA to LAD, SV graft to RCA, sequential saphenous vein graft to diagnal and OM1  . HTN (hypertension)   . Hyperlipidemia   . MI (myocardial infarction) (HCC)   . Schizophrenia [facility](HCC)     Past Surgical History:  Procedure Laterality Date  . CARDIAC CATHETERIZATION     EF 50%--severe two vessel obstructive atherosclerotic coronary artery disease--Low normal left ventricle funcrtion  . CORONARY ARTERY BYPASS GRAFT  2007  . HERNIA REPAIR    . TONSILLECTOMY AND ADENOIDECTOMY Bilateral y-59   Family History:  Family History  Problem Relation Age of Onset  . Heart disease Mother   . Heart attack Mother   . Heart disease Father   . Heart attack Father   . Heart disease Maternal Grandmother   . Stroke Maternal Grandfather   . Heart failure Maternal Aunt    Family Psychiatric  History:  Social History:  History  Alcohol Use No     History  Drug Use No    Social History   Social History  .  Marital status: Single    Spouse name: N/A  . Number of children: N/A  . Years of education: N/A   Social History Main Topics  . Smoking status: Former Smoker    Types: Cigarettes    Quit date: 02/03/2006  . Smokeless tobacco: Never Used  . Alcohol use No  . Drug use: No  . Sexual activity: Not Currently   Other Topics Concern  . None   Social History Narrative  . None   Additional Social History:                         Sleep: Good  Appetite:  Good  Current Medications: Current Outpatient Prescriptions  Medication Sig Dispense Refill  . aspirin (ASPIRIN EC) 81 MG EC tablet Take 81 mg by mouth daily. Swallow whole.    Marland Kitchen. atorvastatin (LIPITOR) 20 MG tablet TAKE 1 TABLET BY MOUTH  DAILY 90 tablet 3  . busPIRone (BUSPAR) 5 MG tablet Take 1 tablet (5 mg total) by mouth 2 (two) times daily. 60 tablet 7  . hydroxypropyl methylcellulose / hypromellose (ISOPTO TEARS / GONIOVISC) 2.5 % ophthalmic solution Place 1 drop into both eyes 4 (four) times daily as needed for dry eyes.    Marland Kitchen. losartan-hydrochlorothiazide (HYZAAR) 100-25 MG tablet Take 1 tablet by mouth daily.    . metoprolol tartrate (LOPRESSOR) 50 MG  tablet TAKE 1 TABLET BY MOUTH TWO  TIMES DAILY 180 tablet 3  . Multiple Vitamin (MULTIVITAMIN) tablet Take 1 tablet by mouth daily.      Marland Kitchen NITROSTAT 0.4 MG SL tablet Dissolve 1 tablet under the tongue as needed for chest  pain every 5 minutes 75 tablet 3  . thioridazine (MELLARIL) 25 MG tablet 1  qhs 30 tablet 7  . Vitamin D, Cholecalciferol, 400 units CHEW Chew 400 Units by mouth daily.     No current facility-administered medications for this visit.     Lab Results: No results found for this or any previous visit (from the past 48 hour(s)).  Physical Findings: AIMS:  , ,  ,  ,    CIWA:    COWS:     Musculoskeletal: Strength & Muscle Tone: within normal limits Gait & Station: normal Patient leans:  Psychiatric Specialty Exam: ROS  Blood pressure 110/82,  pulse (!) 59, height 6' (1.829 m), weight 212 lb 3.2 oz (96.3 kg).Body mass index is 28.78 kg/m.  General Appearance: Meticulous  Eye Contact::  Good  Speech:  Clear and Coherent  Volume:  Normal  Mood:  Euthymic  Affect:  Appropriate  Thought Process:  Coherent  Orientation:  Full (Time, Place, and Person)  Thought Content:  WDL  Suicidal Thoughts:  No  Homicidal Thoughts:  No  Memory:  NA  Judgement:  NA  Insight:  Good  Psychomotor Activity:  Normal  Concentration:  Good  Recall:  Good  Fund of Knowledge:Good  Language: Good  Akathisia:  No  Handed:  Right  AIMS (if indicated):     Assets:  Communication Skills  ADL's:  Intact  Cognition: WNL  Sleep:      Treatment Plan Summary: 01/26/2017, 9:08 AM   Today the patient is doing well. His major problem is that of a chronic psychotic disorder and is in remission. Today will continue his Mellaril as prescribed. Also continue taking BuSpar as ordered.he patient denies being suicidal or homicidal. He denies any neurological symptoms or any physical complaints at this time. He is functioning extremely well. Return to see me in 6 monthsfor a 15 minute visit.

## 2017-04-09 ENCOUNTER — Telehealth: Payer: Self-pay | Admitting: *Deleted

## 2017-04-09 NOTE — Telephone Encounter (Signed)
   [facility] Pre-operative Risk Assessment    Request for surgical clearance:  1. What type of surgery is being performed? COLONOSCOPY    2. When is this surgery scheduled? 04/12/2017   3. Are there any medications that need to be held prior to surgery and how long?     4. Practice name and name of physician performing surgery? [facility]   5. What is your office phone and fax number? PHONE [phone] FAX [phone]   6. Anesthesia type (None, local, MAC, general) ? PROPOFOL

## 2017-04-12 NOTE — Telephone Encounter (Signed)
Called to discuss pre op clearance but his procedure was already done today.  Corine ShelterLUKE Empress Newmann PA-C 04/12/2017 2:16 PM

## 2017-05-12 NOTE — Progress Notes (Signed)
Cardiology Office Note   Date:  05/14/2017   ID:  RANKIN COOLMAN, DOB Dec 20, 1945, MRN [id number]  PCP:  Salli Real, MD  Cardiologist:   Chilton Si, MD   No chief complaint on file.     History of Present Illness: Gilbert Harris is a 72 y.o. male with hypertension, hyperlipidemia, CAD s/p MI and CABG in 2007, mild carotid stenosis, syncope, and schizophrenia who presents for follow up.  [name] has been feeling well.  He has been exercising daily.  He walks at the gym and then does arm and leg weights.  He feels great with exercise and has no chest pain or shortness of breath.  He has been seen by his primary care since last appointment and was started on losartan/HCTZ.  However he stopped taking it because his blood pressure was getting too low.  He also had pain in the right side of his neck.  He does not check his blood pressure at home.  He has not experienced any lower extremity edema, orthopnea, or PND.   Past Medical History:  Diagnosis Date  . Carotid stenosis 01/05/2016  . Coronary artery disease    CABG x4 LIMA to LAD, SV graft to RCA, sequential saphenous vein graft to diagnal and OM1  . HTN (hypertension)   . Hyperlipidemia   . MI (myocardial infarction) (HCC)   . Schizophrenia [name])     Past Surgical History:  Procedure Laterality Date  . CARDIAC CATHETERIZATION     EF 50%--severe two vessel obstructive atherosclerotic coronary artery disease--Low normal left ventricle funcrtion  . CORONARY ARTERY BYPASS GRAFT  2007  . HERNIA REPAIR    . TONSILLECTOMY AND ADENOIDECTOMY Bilateral y-59     Current Outpatient Medications  Medication Sig Dispense Refill  . aspirin (ASPIRIN EC) 81 MG EC tablet Take 81 mg by mouth daily. Swallow whole.    Marland Kitchen atorvastatin (LIPITOR) 20 MG tablet TAKE 1 TABLET BY MOUTH  DAILY 90 tablet 3  . busPIRone (BUSPAR) 5 MG tablet Take 1 tablet (5 mg total) by mouth 2 (two) times daily. 60 tablet 7  . hydroxypropyl methylcellulose /  hypromellose (ISOPTO TEARS / GONIOVISC) 2.5 % ophthalmic solution Place 1 drop into both eyes 4 (four) times daily as needed for dry eyes.    . metoprolol tartrate (LOPRESSOR) 50 MG tablet TAKE 1 TABLET BY MOUTH TWO  TIMES DAILY 180 tablet 3  . Multiple Vitamin (MULTIVITAMIN) tablet Take 1 tablet by mouth daily.      Marland Kitchen NITROSTAT 0.4 MG SL tablet Dissolve 1 tablet under the tongue as needed for chest  pain every 5 minutes 75 tablet 3  . thioridazine (MELLARIL) 25 MG tablet 1  qhs 30 tablet 7  . Vitamin D, Cholecalciferol, 400 units CHEW Chew 400 Units by mouth daily.    . hydrochlorothiazide (HYDRODIURIL) 25 MG tablet Take 1 tablet (25 mg total) by mouth daily. 90 tablet 3   No current facility-administered medications for this visit.     Allergies:   Haloperidol decanoate    Social History:  The patient  reports that he quit smoking about 11 years ago. His smoking use included cigarettes. he has never used smokeless tobacco. He reports that he does not drink alcohol or use drugs.   Family History:  The patient's family history includes Heart attack in his father and mother; Heart disease in his father, maternal grandmother, and mother; Heart failure in his maternal aunt; Stroke in his  maternal grandfather.    ROS:  Please see the history of present illness.   Otherwise, review of systems are positive for none.   All other systems are reviewed and negative.    PHYSICAL EXAM: VS:  BP (!) 138/92   Pulse 69   Ht 6' (1.829 m)   Wt 211 lb (95.7 kg)   BMI 28.62 kg/m  , BMI Body mass index is 28.62 kg/m. GENERAL:  Well appearing.  No acute distress HEENT: Pupils equal round and reactive, fundi not visualized, oral mucosa unremarkable NECK:  No jugular venous distention, waveform within normal limits, carotid upstroke brisk and symmetric, no bruits LUNGS:  Clear to auscultation bilaterally HEART:  RRR.  PMI not displaced or sustained,S1 and S2 within normal limits, no S3, no S4, no clicks,  no rubs, no murmurs ABD:  Flat, positive bowel sounds normal in frequency in pitch, no bruits, no rebound, no guarding, no midline pulsatile mass, no hepatomegaly, no splenomegaly EXT:  2 plus pulses throughout, no edema, no cyanosis no clubbing SKIN:  No rashes no nodules NEURO:  Cranial nerves II through XII grossly intact, motor grossly intact throughout PSYCH:  Cognitively intact, oriented to person place and time   EKG:  EKG is ordered today. The ekg ordered 01/05/16 demonstrates sinus rhythm. Rate 64 bpm. nonspecific T wave abnormalities. 07/03/16: Sinus rhythm.  Rate 74 bpm.  Lateral T wave abnormalities 05/14/17: Sinus rhythm.  Rate 69 bpm.  Recent Labs: No results found for requested labs within last 8760 hours.   Carotid Doppler 07/2015: 1-39% stenosis bilaterally   Lipid Panel    Component Value Date/Time   CHOL 111 04/21/2016 1032   TRIG 86 04/21/2016 1032   HDL 48 04/21/2016 1032   CHOLHDL 2.3 04/21/2016 1032   VLDL 17 04/21/2016 1032   LDLCALC 46 04/21/2016 1032      Wt Readings from Last 3 Encounters:  05/14/17 211 lb (95.7 kg)  11/08/16 209 lb 6.4 oz (95 kg)  07/03/16 215 lb (97.5 kg)      ASSESSMENT AND PLAN:  # Hypertension:  Blood pressure is elevated today.  He has not been taking losartan/HCTZ because his blood pressure was getting too low.  Continue metoprolol 50 mg daily.  We will add back hydrochlorthiazide 25 mg daily.  He will return in 1 week for a CMP.  #  CAD s/p CABG: [name] continues to do well.  He exercises regularly and has no angina.  Continue aspirin, atorvastatin, and metoprolol.  # Hyperlipidemia: LDL 46 03/2016. He is due for repeat lipids.  We will check this in 1 week when he returns for his renal function testing.  Continue atorvastatin.  # Carotid stenosis: Mild bilateral carotid stenosis 07/2015.  Continue aspirin and atorvastatin.   Current medicines are reviewed at length with the patient today.  The patient does not have  concerns regarding medicines.  The following changes have been made:  no change  Labs/ tests ordered today include:   Orders Placed This Encounter  Procedures  . Comprehensive metabolic panel  . Lipid panel     Disposition:   FU with Margarethe Virgen C. Duke Salviaandolph, MD, [facility] in  3-4 months.    This note was written with the assistance of speech recognition software.  Please excuse any transcriptional errors.  Signed, Deniya Craigo C. Duke Salviaandolph, MD, [facility]  05/14/2017 11:59 AM    [facility]

## 2017-05-14 ENCOUNTER — Encounter: Payer: Self-pay | Admitting: Cardiovascular Disease

## 2017-05-14 ENCOUNTER — Ambulatory Visit (INDEPENDENT_AMBULATORY_CARE_PROVIDER_SITE_OTHER): Payer: Medicare Other | Admitting: Cardiovascular Disease

## 2017-05-14 VITALS — BP 138/92 | HR 69 | Ht 72.0 in | Wt 211.0 lb

## 2017-05-14 DIAGNOSIS — I119 Hypertensive heart disease without heart failure: Secondary | ICD-10-CM | POA: Diagnosis not present

## 2017-05-14 DIAGNOSIS — Z951 Presence of aortocoronary bypass graft: Secondary | ICD-10-CM | POA: Diagnosis not present

## 2017-05-14 DIAGNOSIS — E7800 Pure hypercholesterolemia, unspecified: Secondary | ICD-10-CM

## 2017-05-14 MED ORDER — HYDROCHLOROTHIAZIDE 25 MG PO TABS: 25.0000 mg | ORAL_TABLET | Freq: Every day | ORAL | 3 refills | Status: DC

## 2017-05-14 NOTE — Patient Instructions (Signed)
Medication Instructions:  START HYDROCHLOROTHIAZIDE 25 MG DAILY    Labwork: FASTING LP/CMET IN 1 WEEK AT 10:00 AM   Testing/Procedures: NONE  Follow-Up: Your physician recommends that you schedule a follow-up appointment in: 3-4 MONTHS   If you need a refill on your cardiac medications before your next appointment, please call your pharmacy.

## 2017-05-24 LAB — LIPID PANEL
Chol/HDL Ratio: 2.8 ratio (ref 0.0–5.0)
Cholesterol, Total: 132 mg/dL (ref 100–199)
HDL: 47 mg/dL (ref >39)
LDL Calculated: 73 mg/dL (ref 0–99)
Triglycerides: 62 mg/dL (ref 0–149)
VLDL Cholesterol Cal: 12 mg/dL (ref 5–40)

## 2017-05-24 LAB — COMPREHENSIVE METABOLIC PANEL
ALT: 36 IU/L (ref 0–44)
AST: 30 IU/L (ref 0–40)
Albumin/Globulin Ratio: 1.6 (ref 1.2–2.2)
Albumin: 4.2 g/dL (ref 3.5–4.8)
Alkaline Phosphatase: 71 IU/L (ref 39–117)
BUN/Creatinine Ratio: 11 (ref 10–24)
BUN: 13 mg/dL (ref 8–27)
Bilirubin Total: 0.4 mg/dL (ref 0.0–1.2)
CO2: 25 mmol/L (ref 20–29)
Calcium: 9.3 mg/dL (ref 8.6–10.2)
Chloride: 104 mmol/L (ref 96–106)
Creatinine, Ser: 1.21 mg/dL (ref 0.76–1.27)
GFR calc Af Amer: 69 mL/min/{1.73_m2} (ref >59)
GFR calc non Af Amer: 60 mL/min/{1.73_m2} (ref >59)
Globulin, Total: 2.6 g/dL (ref 1.5–4.5)
Glucose: 100 mg/dL — ABNORMAL HIGH (ref 65–99)
Potassium: 4.7 mmol/L (ref 3.5–5.2)
Sodium: 141 mmol/L (ref 134–144)
Total Protein: 6.8 g/dL (ref 6.0–8.5)

## 2017-05-29 ENCOUNTER — Telehealth: Payer: Self-pay | Admitting: Cardiovascular Disease

## 2017-05-29 NOTE — Telephone Encounter (Signed)
SPOKE TO PHARMACIST , DISCONTINUE ON 2/181/19 LOSARTAN/HCTZ.  NEW RX FOR HCTZ 25 MG STARTED. VERBALIZED UNDERSTADNIG.

## 2017-05-29 NOTE — Telephone Encounter (Signed)
New message    [facility] calling to verify Losartan discontinued.

## 2017-07-27 ENCOUNTER — Ambulatory Visit (HOSPITAL_COMMUNITY): Payer: Self-pay | Admitting: Psychiatry

## 2017-08-08 ENCOUNTER — Ambulatory Visit (INDEPENDENT_AMBULATORY_CARE_PROVIDER_SITE_OTHER): Payer: Medicare HMO | Admitting: Psychiatry

## 2017-08-08 ENCOUNTER — Encounter (HOSPITAL_COMMUNITY): Payer: Self-pay | Admitting: Psychiatry

## 2017-08-08 VITALS — BP 143/93 | HR 65 | Ht 72.0 in | Wt 210.0 lb

## 2017-08-08 DIAGNOSIS — Z87891 Personal history of nicotine dependence: Secondary | ICD-10-CM

## 2017-08-08 DIAGNOSIS — F200 Paranoid schizophrenia: Secondary | ICD-10-CM | POA: Diagnosis not present

## 2017-08-08 MED ORDER — THIORIDAZINE HCL 25 MG PO TABS: ORAL_TABLET | ORAL | 7 refills | Status: DC

## 2017-08-08 MED ORDER — BUSPIRONE HCL 5 MG PO TABS: 5.0000 mg | ORAL_TABLET | Freq: Two times a day (BID) | ORAL | 7 refills | Status: DC

## 2017-08-08 NOTE — Progress Notes (Signed)
Patient ID: Gilbert Harris, male   DOB: 1945-12-11, 72 y.o.   MRN: [id number] [facility] MD Progress Note  08/08/2017 4:04 PM HOWARD BUNTE  MRN:  [id number] Subjective: Doing well Principal Problem: Schizophrenia residual Diagreat. nosis:  Schizophrenia residual  Today the patient is seen one time. He is doing very well. His mood is great. He lives independently in his own apartment. His health is good. His coronary artery disease and has had bypass surgery in the past. He's having no chest pain or shortness of breath and he sees his cardiologist next few months. He is sleeping and eating very well. He's got good energy. He still does a lot of art. He likes the place she is living in. There is a new people there and seems to make friends. He denies the use of alcohol or drugs. He exercises 2 times a week. He is 2 sons both of them are doing well. He seen in the last year. This patient is positive and optimistic. He shows no signs of neurological conditions. Importantly is experiencing no auditory visual hallucinations or any for psychosis. He shows no evidence of tardive dyskinesia.  Past Medical History:  Past Medical History:  Diagnosis Date  . Carotid stenosis 01/05/2016  . Coronary artery disease    CABG x4 LIMA to LAD, SV graft to RCA, sequential saphenous vein graft to diagnal and OM1  . HTN (hypertension)   . Hyperlipidemia   . MI (myocardial infarction) (HCC)   . Schizophrenia [facility])     Past Surgical History:  Procedure Laterality Date  . CARDIAC CATHETERIZATION     EF 50%--severe two vessel obstructive atherosclerotic coronary artery disease--Low normal left ventricle funcrtion  . CORONARY ARTERY BYPASS GRAFT  2007  . HERNIA REPAIR    . TONSILLECTOMY AND ADENOIDECTOMY Bilateral y-59   Family History:  Family History  Problem Relation Age of Onset  . Heart disease Mother   . Heart attack Mother   . Heart disease Father   . Heart attack Father   . Heart disease Maternal Grandmother    . Stroke Maternal Grandfather   . Heart failure Maternal Aunt    Family Psychiatric  History:  Social History:  Social History   Substance and Sexual Activity  Alcohol Use No  . Alcohol/week: 0.0 oz     Social History   Substance and Sexual Activity  Drug Use No    Social History   Socioeconomic History  . Marital status: Single    Spouse name: Not on file  . Number of children: Not on file  . Years of education: Not on file  . Highest education level: Not on file  Occupational History  . Not on file  Social Needs  . Financial resource strain: Not on file  . Food insecurity:    Worry: Not on file    Inability: Not on file  . Transportation needs:    Medical: Not on file    Non-medical: Not on file  Tobacco Use  . Smoking status: Former Smoker    Types: Cigarettes    Last attempt to quit: 02/03/2006    Years since quitting: 11.5  . Smokeless tobacco: Never Used  Substance and Sexual Activity  . Alcohol use: No    Alcohol/week: 0.0 oz  . Drug use: No  . Sexual activity: Not Currently  Lifestyle  . Physical activity:    Days per week: Not on file    Minutes per session: Not on file  .  Stress: Not on file  Relationships  . Social connections:    Talks on phone: Not on file    Gets together: Not on file    Attends religious service: Not on file    Active member of club or organization: Not on file    Attends meetings of clubs or organizations: Not on file    Relationship status: Not on file  Other Topics Concern  . Not on file  Social History Narrative  . Not on file   Additional Social History:                         Sleep: Good  Appetite:  Good  Current Medications: Current Outpatient Medications  Medication Sig Dispense Refill  . aspirin (ASPIRIN EC) 81 MG EC tablet Take 81 mg by mouth daily. Swallow whole.    Marland Kitchen atorvastatin (LIPITOR) 20 MG tablet TAKE 1 TABLET BY MOUTH  DAILY 90 tablet 3  . busPIRone (BUSPAR) 5 MG tablet Take 1  tablet (5 mg total) by mouth 2 (two) times daily. 60 tablet 7  . hydrochlorothiazide (HYDRODIURIL) 25 MG tablet Take 1 tablet (25 mg total) by mouth daily. 90 tablet 3  . hydroxypropyl methylcellulose / hypromellose (ISOPTO TEARS / GONIOVISC) 2.5 % ophthalmic solution Place 1 drop into both eyes 4 (four) times daily as needed for dry eyes.    . metoprolol tartrate (LOPRESSOR) 50 MG tablet TAKE 1 TABLET BY MOUTH TWO  TIMES DAILY 180 tablet 3  . Multiple Vitamin (MULTIVITAMIN) tablet Take 1 tablet by mouth daily.      Marland Kitchen NITROSTAT 0.4 MG SL tablet Dissolve 1 tablet under the tongue as needed for chest  pain every 5 minutes 75 tablet 3  . thioridazine (MELLARIL) 25 MG tablet 1  qhs 30 tablet 7  . Vitamin D, Cholecalciferol, 400 units CHEW Chew 400 Units by mouth daily.     No current facility-administered medications for this visit.     Lab Results: No results found for this or any previous visit (from the past 48 hour(s)).  Physical Findings: AIMS:  , ,  ,  ,    CIWA:    COWS:     Musculoskeletal: Strength & Muscle Tone: within normal limits Gait & Station: normal Patient leans:  Psychiatric Specialty Exam: ROS  Blood pressure (!) 143/93, pulse 65, height 6' (1.829 m), weight 210 lb (95.3 kg), SpO2 97 %.Body mass index is 28.48 kg/m.  General Appearance: Meticulous  Eye Contact::  Good  Speech:  Clear and Coherent  Volume:  Normal  Mood:  Euthymic  Affect:  Appropriate  Thought Process:  Coherent  Orientation:  Full (Time, Place, and Person)  Thought Content:  WDL  Suicidal Thoughts:  No  Homicidal Thoughts:  No  Memory:  NA  Judgement:  NA  Insight:  Good  Psychomotor Activity:  Normal  Concentration:  Good  Recall:  Good  Fund of Knowledge:Good  Language: Good  Akathisia:  No  Handed:  Right  AIMS (if indicated):     Assets:  Communication Skills  ADL's:  Intact  Cognition: WNL  Sleep:      Treatment Plan Summary: 08/08/2017, 4:04 PM   At this time the patient  continue taking Mellaril 25 mgand continue BuSpar 7.5 mg twice a day.patient is positive and optimistic. There continue to just what is doing and return to see me in 6 months.the patient is not suicidal or homicidal. Is very  responsible for his car. He does all his institutional ADLs

## 2017-08-17 ENCOUNTER — Other Ambulatory Visit (HOSPITAL_COMMUNITY): Payer: Self-pay

## 2017-08-17 MED ORDER — FLUPHENAZINE HCL 1 MG PO TABS: 1.0000 mg | ORAL_TABLET | Freq: Every day | ORAL | 7 refills | Status: DC

## 2017-08-17 NOTE — Progress Notes (Signed)
Called Dr. Donell Beers and sent in new prescription, Mellaril was on backorder.

## 2017-08-21 ENCOUNTER — Other Ambulatory Visit: Payer: Self-pay | Admitting: Cardiovascular Disease

## 2017-08-21 MED ORDER — HYDROCHLOROTHIAZIDE 25 MG PO TABS: 25.0000 mg | ORAL_TABLET | Freq: Every day | ORAL | 3 refills | Status: DC

## 2017-08-21 NOTE — Telephone Encounter (Signed)
Rx sent to pharmacy, patient notified directly and voiced understanding.

## 2017-08-21 NOTE — Telephone Encounter (Signed)
°*  STAT* If patient is at the pharmacy, call can be transferred to refill team.   1. Which medications need to be refilled? (please list name of each medication and dose if known) need new prescription,changing pharmacy- Hydrochlorothiazide  2. Which pharmacy/location (including street and city if local pharmacy) is medication to be sent to?[facility]- [phone]  3. Do they need a 30 day or 90 day supply? 90 and 3 refills

## 2017-09-10 ENCOUNTER — Ambulatory Visit: Payer: Medicare HMO | Admitting: Cardiovascular Disease

## 2017-09-10 ENCOUNTER — Encounter: Payer: Self-pay | Admitting: Cardiovascular Disease

## 2017-09-10 VITALS — BP 127/88 | HR 65 | Ht 72.0 in | Wt 212.2 lb

## 2017-09-10 DIAGNOSIS — E7800 Pure hypercholesterolemia, unspecified: Secondary | ICD-10-CM

## 2017-09-10 DIAGNOSIS — I6523 Occlusion and stenosis of bilateral carotid arteries: Secondary | ICD-10-CM

## 2017-09-10 DIAGNOSIS — I119 Hypertensive heart disease without heart failure: Secondary | ICD-10-CM

## 2017-09-10 DIAGNOSIS — Z951 Presence of aortocoronary bypass graft: Secondary | ICD-10-CM | POA: Diagnosis not present

## 2017-09-10 NOTE — Patient Instructions (Signed)
Medication Instructions:  °Your physician recommends that you continue on your current medications as directed. Please refer to the Current Medication list given to you today.  ° °Labwork: °NONE ° °Testing/Procedures: °NONE ° °Follow-Up: °Your physician wants you to follow-up in: 6 MONTHS  You will receive a reminder letter in the mail two months in advance. If you don't receive a letter, please call our office to schedule the follow-up appointment. ° °If you need a refill on your cardiac medications before your next appointment, please call your pharmacy. °

## 2017-09-10 NOTE — Progress Notes (Signed)
Cardiology Office Note   Date:  09/10/2017   ID:  Gilbert Harris, DOB January 13, 1946, MRN [id number]  PCP:  Salli RealSun, Yun, MD  Cardiologist:   Chilton Siiffany Hickman, MD   Chief Complaint  Patient presents with  . Follow-up    4 months;      History of Present Illness: Gilbert Harris is a 72 y.o. male with hypertension, hyperlipidemia, CAD s/p MI and CABG in 2007, mild carotid stenosis, syncope, and schizophrenia who presents for follow up.  At his last appointment [name]'s BP was elevated.  He had stopped taking losartan/HCTZ b/c his BP was getting too low.  HCTZ alone was added to his regimen.  Since making that change his blood pressure has been well controlled.  He has not had any low blood pressures.  He is tolerating HCTZ  without complications.  He continues to participate in exercise class 3 to 4 days/week.  He has no chest pain or shortness of breath with this activity.  He also gardens for several hours daily.  He has no lower extremity edema, orthopnea or PND.    Past Medical History:  Diagnosis Date  . Carotid stenosis 01/05/2016  . Coronary artery disease    CABG x4 LIMA to LAD, SV graft to RCA, sequential saphenous vein graft to diagnal and OM1  . HTN (hypertension)   . Hyperlipidemia   . MI (myocardial infarction) (HCC)   . Schizophrenia [facility](HCC)     Past Surgical History:  Procedure Laterality Date  . CARDIAC CATHETERIZATION     EF 50%--severe two vessel obstructive atherosclerotic coronary artery disease--Low normal left ventricle funcrtion  . CORONARY ARTERY BYPASS GRAFT  2007  . HERNIA REPAIR    . TONSILLECTOMY AND ADENOIDECTOMY Bilateral y-59     Current Outpatient Medications  Medication Sig Dispense Refill  . aspirin (ASPIRIN EC) 81 MG EC tablet Take 81 mg by mouth daily. Swallow whole.    Marland Kitchen. atorvastatin (LIPITOR) 20 MG tablet TAKE 1 TABLET BY MOUTH  DAILY 90 tablet 3  . busPIRone (BUSPAR) 5 MG tablet Take 1 tablet (5 mg total) by mouth 2 (two) times daily. 60  tablet 7  . fluPHENAZine (PROLIXIN) 1 MG tablet Take 1 tablet (1 mg total) by mouth at bedtime. 30 tablet 7  . hydrochlorothiazide (HYDRODIURIL) 25 MG tablet Take 1 tablet (25 mg total) by mouth daily. 90 tablet 3  . hydroxypropyl methylcellulose / hypromellose (ISOPTO TEARS / GONIOVISC) 2.5 % ophthalmic solution Place 1 drop into both eyes 4 (four) times daily as needed for dry eyes.    . metoprolol tartrate (LOPRESSOR) 50 MG tablet TAKE 1 TABLET BY MOUTH TWO  TIMES DAILY 180 tablet 3  . Multiple Vitamin (MULTIVITAMIN) tablet Take 1 tablet by mouth daily.      Marland Kitchen. NITROSTAT 0.4 MG SL tablet Dissolve 1 tablet under the tongue as needed for chest  pain every 5 minutes 75 tablet 3  . Vitamin D, Cholecalciferol, 400 units CHEW Chew 400 Units by mouth daily.     No current facility-administered medications for this visit.     Allergies:   Haloperidol decanoate    Social History:  The patient  reports that he quit smoking about 11 years ago. His smoking use included cigarettes. He has never used smokeless tobacco. He reports that he does not drink alcohol or use drugs.   Family History:  The patient's family history includes Heart attack in his father and mother; Heart disease in his  father, maternal grandmother, and mother; Heart failure in his maternal aunt; Stroke in his maternal grandfather.    ROS:  Please see the history of present illness.   Otherwise, review of systems are positive for none.   All other systems are reviewed and negative.    PHYSICAL EXAM: VS:  BP 127/88   Pulse 65   Ht 6' (1.829 m)   Wt 212 lb 3.2 oz (96.3 kg)   BMI 28.78 kg/m  , BMI Body mass index is 28.78 kg/m. GENERAL:  Well appearing HEENT: Pupils equal round and reactive, fundi not visualized, oral mucosa unremarkable NECK:  No jugular venous distention, waveform within normal limits, carotid upstroke brisk and symmetric, no bruits LUNGS:  Clear to auscultation bilaterally HEART:  RRR.  PMI not displaced or  sustained,S1 and S2 within normal limits, no S3, no S4, no clicks, no rubs, no murmurs ABD:  Flat, positive bowel sounds normal in frequency in pitch, no bruits, no rebound, no guarding, no midline pulsatile mass, no hepatomegaly, no splenomegaly EXT:  2 plus pulses throughout, no edema, no cyanosis no clubbing SKIN:  No rashes no nodules NEURO:  Cranial nerves II through XII grossly intact, motor grossly intact throughout PSYCH:  Cognitively intact, oriented to person place and time   EKG:  EKG is not ordered today. The ekg ordered 01/05/16 demonstrates sinus rhythm. Rate 64 bpm. nonspecific T wave abnormalities. 07/03/16: Sinus rhythm.  Rate 74 bpm.  Lateral T wave abnormalities 05/14/17: Sinus rhythm.  Rate 69 bpm.  Recent Labs: 05/24/2017: ALT 36; BUN 13; Creatinine, Ser 1.21; Potassium 4.7; Sodium 141   Carotid Doppler 07/2015: 1-39% stenosis bilaterally   Lipid Panel    Component Value Date/Time   CHOL 132 05/24/2017 1034   TRIG 62 05/24/2017 1034   HDL 47 05/24/2017 1034   CHOLHDL 2.8 05/24/2017 1034   CHOLHDL 2.3 04/21/2016 1032   VLDL 17 04/21/2016 1032   LDLCALC 73 05/24/2017 1034      Wt Readings from Last 3 Encounters:  09/10/17 212 lb 3.2 oz (96.3 kg)  05/14/17 211 lb (95.7 kg)  11/08/16 209 lb 6.4 oz (95 kg)      ASSESSMENT AND PLAN:  # Hypertension:  Blood pressure is well-controlled.  Continue HCTZ and metoprolol.   # CAD s/p CABG: Gilbert Harris continues to do well.  He exercises regularly and has no angina.  Continue aspirin, atorvastatin, and metoprolol.  # Hyperlipidemia: LDL 73 on 04/2017.  Continue atorvastatin.  # Carotid stenosis: Mild bilateral carotid stenosis 07/2015.  Continue aspirin and atorvastatin.  Will repeat at follow up.   Current medicines are reviewed at length with the patient today.  The patient does not have concerns regarding medicines.  The following changes have been made:  no change  Labs/ tests ordered today include:   No orders  of the defined types were placed in this encounter.    Disposition:   FU with Gilbert Muhlbauer C. Duke Salvia, MD, [facility] in  6 months.     Signed, Jadiel Schmieder C. Duke Salvia, MD, [facility]  09/10/2017 11:21 AM    [facility]

## 2017-11-01 ENCOUNTER — Other Ambulatory Visit: Payer: Self-pay | Admitting: *Deleted

## 2017-11-01 MED ORDER — METOPROLOL TARTRATE 50 MG PO TABS: 50.0000 mg | ORAL_TABLET | Freq: Two times a day (BID) | ORAL | 1 refills | Status: DC

## 2017-11-29 ENCOUNTER — Ambulatory Visit: Payer: Medicare HMO | Admitting: Cardiovascular Disease

## 2017-11-29 ENCOUNTER — Encounter: Payer: Self-pay | Admitting: Cardiovascular Disease

## 2017-11-29 VITALS — BP 118/84 | HR 63 | Ht 72.0 in | Wt 202.0 lb

## 2017-11-29 DIAGNOSIS — I6523 Occlusion and stenosis of bilateral carotid arteries: Secondary | ICD-10-CM

## 2017-11-29 DIAGNOSIS — E7800 Pure hypercholesterolemia, unspecified: Secondary | ICD-10-CM

## 2017-11-29 DIAGNOSIS — I119 Hypertensive heart disease without heart failure: Secondary | ICD-10-CM | POA: Diagnosis not present

## 2017-11-29 DIAGNOSIS — Z951 Presence of aortocoronary bypass graft: Secondary | ICD-10-CM | POA: Diagnosis not present

## 2017-11-29 NOTE — Progress Notes (Signed)
Cardiology Office Note   Date:  11/29/2017   ID:  Gilbert Harris, DOB [birth year], MRN [id number]  PCP:  Salli Real, MD  Cardiologist:   Chilton Si, MD   Chief Complaint  Patient presents with  . Follow-up    6 months      History of Present Illness: Gilbert Harris is a 72 y.o. male with hypertension, hyperlipidemia, CAD s/p MI and CABG in 2007, mild carotid stenosis, syncope, and schizophrenia who presents for follow up.  At his last appointment Gilbert Harris BP was elevated.  He had stopped taking losartan/HCTZ b/c his BP was getting too low.  HCTZ alone was added to his regimen.  Gilbert Harris continues to do well.  He does 3 or 4 exercise classes for 1 hour each week.  He has no chest pain or shortness of breath with his activity.  He also likes to walk regularly.  He feels great after he exercises.  He has no lower extremity edema, orthopnea, or PND.  His primary care provider told him that he needed to lose some weights.  He has been limiting his sweets and has been able to lose 10 pounds.  Overall he is without complaint today.   Past Medical History:  Diagnosis Date  . Carotid stenosis 01/05/2016  . Coronary artery disease    CABG x4 LIMA to LAD, SV graft to RCA, sequential saphenous vein graft to diagnal and OM1  . HTN (hypertension)   . Hyperlipidemia   . MI (myocardial infarction) (HCC)   . Schizophrenia [facility])     Past Surgical History:  Procedure Laterality Date  . CARDIAC CATHETERIZATION     EF 50%--severe two vessel obstructive atherosclerotic coronary artery disease--Low normal left ventricle funcrtion  . CORONARY ARTERY BYPASS GRAFT  2007  . HERNIA REPAIR    . TONSILLECTOMY AND ADENOIDECTOMY Bilateral y-59     Current Outpatient Medications  Medication Sig Dispense Refill  . aspirin (ASPIRIN EC) 81 MG EC tablet Take 81 mg by mouth daily. Swallow whole.    Marland Kitchen atorvastatin (LIPITOR) 20 MG tablet TAKE 1 TABLET BY MOUTH  DAILY 90 tablet 3  . busPIRone (BUSPAR) 5 MG  tablet Take 1 tablet (5 mg total) by mouth 2 (two) times daily. 60 tablet 7  . Cholecalciferol (VITAMIN D-3) 5000 units TABS Take 5,000 Units by mouth daily.    . hydrochlorothiazide (HYDRODIURIL) 25 MG tablet Take 1 tablet (25 mg total) by mouth daily. 90 tablet 3  . hydroxypropyl methylcellulose / hypromellose (ISOPTO TEARS / GONIOVISC) 2.5 % ophthalmic solution Place 1 drop into both eyes 4 (four) times daily as needed for dry eyes.    . metoprolol tartrate (LOPRESSOR) 50 MG tablet Take 1 tablet (50 mg total) by mouth 2 (two) times daily. 180 tablet 1  . Multiple Vitamin (MULTIVITAMIN) tablet Take 1 tablet by mouth daily.      Marland Kitchen NITROSTAT 0.4 MG SL tablet Dissolve 1 tablet under the tongue as needed for chest  pain every 5 minutes 75 tablet 3   No current facility-administered medications for this visit.     Allergies:   Haloperidol decanoate    Social History:  The patient  reports that he quit smoking about 11 years ago. His smoking use included cigarettes. He has never used smokeless tobacco. He reports that he does not drink alcohol or use drugs.   Family History:  The patient's family history includes Heart attack in his father and mother; Heart  disease in his father, maternal grandmother, and mother; Heart failure in his maternal aunt; Stroke in his maternal grandfather.    ROS:  Please see the history of present illness.   Otherwise, review of systems are positive for none.   All other systems are reviewed and negative.    PHYSICAL EXAM: VS:  BP 118/84 (BP Location: Left Arm, Patient Position: Sitting, Cuff Size: Normal)   Pulse 63   Ht 6' (1.829 m)   Wt 202 lb (91.6 kg)   BMI 27.40 kg/m  , BMI Body mass index is 27.4 kg/m. GENERAL:  Well appearing HEENT: Pupils equal round and reactive, fundi not visualized, oral mucosa unremarkable NECK:  No jugular venous distention, waveform within normal limits, carotid upstroke brisk and symmetric, no bruits, no  thyromegaly LYMPHATICS:  No cervical adenopathy LUNGS:  Clear to auscultation bilaterally HEART:  RRR.  PMI not displaced or sustained,S1 and S2 within normal limits, no S3, no S4, no clicks, no rubs, no murmurs ABD:  Flat, positive bowel sounds normal in frequency in pitch, no bruits, no rebound, no guarding, no midline pulsatile mass, no hepatomegaly, no splenomegaly EXT:  2 plus pulses throughout, no edema, no cyanosis no clubbing SKIN:  No rashes no nodules NEURO:  Cranial nerves II through XII grossly intact, motor grossly intact throughout PSYCH:  Cognitively intact, oriented to person place and time   EKG:  EKG is ordered today. The ekg ordered 01/05/16 demonstrates sinus rhythm. Rate 64 bpm. nonspecific T wave abnormalities. 07/03/16: Sinus rhythm.  Rate 74 bpm.  Lateral T wave abnormalities 05/14/17: Sinus rhythm.  Rate 69 bpm. 11/29/17: Sinus rhythm.  Rate 63 bpm.  Nonspecific T wave abnormalitiesl  Recent Labs: 05/24/2017: ALT 36; BUN 13; Creatinine, Ser 1.21; Potassium 4.7; Sodium 141   Carotid Doppler 07/2015: 1-39% stenosis bilaterally   Lipid Panel    Component Value Date/Time   CHOL 132 05/24/2017 1034   TRIG 62 05/24/2017 1034   HDL 47 05/24/2017 1034   CHOLHDL 2.8 05/24/2017 1034   CHOLHDL 2.3 04/21/2016 1032   VLDL 17 04/21/2016 1032   LDLCALC 73 05/24/2017 1034      Wt Readings from Last 3 Encounters:  11/29/17 202 lb (91.6 kg)  09/10/17 212 lb 3.2 oz (96.3 kg)  05/14/17 211 lb (95.7 kg)      ASSESSMENT AND PLAN:  # Hypertension:  Blood pressure is well-controlled.  Continue HCTZ and metoprolol. Continue the exercise and weight loss.   # CAD s/p CABG: # Hyperlipidemia:  Gilbert Harris continues to do well.  He exercises regularly and has no angina.  Continue aspirin, atorvastatin, and metoprolol.  LDL 73 on 04/2017.  He will return for fasting lipids and a CMP prior to his next visit.  # Carotid stenosis: Mild bilateral carotid stenosis 07/2015.  Continue aspirin  and atorvastatin.  Will repeat at follow up.   Current medicines are reviewed at length with the patient today.  The patient does not have concerns regarding medicines.  The following changes have been made:  no change  Labs/ tests ordered today include:   No orders of the defined types were placed in this encounter.    Disposition:   FU with Gilbert Harris C. Duke Salvia, MD, [facility] in  6 months.     Signed, [place] Warriner C. Duke Salvia, MD, [facility]  11/29/2017 10:49 AM    [facility]

## 2017-11-29 NOTE — Patient Instructions (Signed)
Medication Instructions:  Your physician recommends that you continue on your current medications as directed. Please refer to the Current Medication list given to you today.  Labwork: FASTING LP/CMET FEW DAYS PRIOR TO FOLLOW UP   Testing/Procedures: NONE  Follow-Up: Your physician wants you to follow-up in: 6 MONTHS  You will receive a reminder letter in the mail two months in advance. If you don't receive a letter, please call our office to schedule the follow-up appointment.  If you need a refill on your cardiac medications before your next appointment, please call your pharmacy.

## 2018-02-13 ENCOUNTER — Encounter (HOSPITAL_COMMUNITY): Payer: Self-pay | Admitting: Psychiatry

## 2018-02-13 ENCOUNTER — Ambulatory Visit (INDEPENDENT_AMBULATORY_CARE_PROVIDER_SITE_OTHER): Payer: Medicare HMO | Admitting: Psychiatry

## 2018-02-13 VITALS — BP 128/74 | Ht 72.0 in | Wt 207.0 lb

## 2018-02-13 DIAGNOSIS — F200 Paranoid schizophrenia: Secondary | ICD-10-CM

## 2018-02-13 MED ORDER — THIORIDAZINE HCL 25 MG PO TABS: ORAL_TABLET | ORAL | 7 refills | Status: DC

## 2018-02-13 MED ORDER — BUSPIRONE HCL 5 MG PO TABS: 5.0000 mg | ORAL_TABLET | Freq: Two times a day (BID) | ORAL | 7 refills | Status: DC

## 2018-02-13 NOTE — Progress Notes (Signed)
Patient ID: Gilbert Harris, male   DOB: 1945-06-07, 72 y.o.   MRN: [id number] [facility] MD Progress Note  02/13/2018 1:25 PM Gilbert Harris  MRN:  [id number] Subjective: Doing well Principal Problem: Schizophrenia residual Diagreat. nosis:  Schizophrenia residual  Today the patient is doing well.  He is well-dressed.  He is engageable and friendly.  The patient denies depression.  He is sleeping and eating very well.  He has no evidence of psychosis.  He actively does his art on a regular basis.  Has had a history of heart disease but has no chest pain or shortness of breath at this time.  Enjoys living at the [place] [facility].  He exercises 4 times a week.  He denies the presence of auditory or visual hallucinations and is not paranoid.  He is functioning very well.  He likes the community that he lives in.  He feels positive about the future.  His symptoms are essentially remitted.  He shows very few negative symptoms.  He denies any neurological symptoms.  Financially he is very stable.  Past Medical History:  Past Medical History:  Diagnosis Date  . Carotid stenosis 01/05/2016  . Coronary artery disease    CABG x4 LIMA to LAD, SV graft to RCA, sequential saphenous vein graft to diagnal and OM1  . HTN (hypertension)   . Hyperlipidemia   . MI (myocardial infarction) (HCC)   . Schizophrenia [facility])     Past Surgical History:  Procedure Laterality Date  . CARDIAC CATHETERIZATION     EF 50%--severe two vessel obstructive atherosclerotic coronary artery disease--Low normal left ventricle funcrtion  . CORONARY ARTERY BYPASS GRAFT  2007  . HERNIA REPAIR    . TONSILLECTOMY AND ADENOIDECTOMY Bilateral y-59   Family History:  Family History  Problem Relation Age of Onset  . Heart disease Mother   . Heart attack Mother   . Heart disease Father   . Heart attack Father   . Heart disease Maternal Grandmother   . Stroke Maternal Grandfather   . Heart failure Maternal Aunt    Family Psychiatric   History:  Social History:  Social History   Substance and Sexual Activity  Alcohol Use No  . Alcohol/week: 0.0 standard drinks     Social History   Substance and Sexual Activity  Drug Use No    Social History   Socioeconomic History  . Marital status: Single    Spouse name: Not on file  . Number of children: Not on file  . Years of education: Not on file  . Highest education level: Not on file  Occupational History  . Not on file  Social Needs  . Financial resource strain: Not on file  . Food insecurity:    Worry: Not on file    Inability: Not on file  . Transportation needs:    Medical: Not on file    Non-medical: Not on file  Tobacco Use  . Smoking status: Former Smoker    Types: Cigarettes    Last attempt to quit: 02/03/2006    Years since quitting: 12.0  . Smokeless tobacco: Never Used  Substance and Sexual Activity  . Alcohol use: No    Alcohol/week: 0.0 standard drinks  . Drug use: No  . Sexual activity: Not Currently  Lifestyle  . Physical activity:    Days per week: Not on file    Minutes per session: Not on file  . Stress: Not on file  Relationships  . Social  connections:    Talks on phone: Not on file    Gets together: Not on file    Attends religious service: Not on file    Active member of club or organization: Not on file    Attends meetings of clubs or organizations: Not on file    Relationship status: Not on file  Other Topics Concern  . Not on file  Social History Narrative  . Not on file   Additional Social History:                         Sleep: Good  Appetite:  Good  Current Medications: Current Outpatient Medications  Medication Sig Dispense Refill  . aspirin (ASPIRIN EC) 81 MG EC tablet Take 81 mg by mouth daily. Swallow whole.    Marland Kitchen atorvastatin (LIPITOR) 20 MG tablet TAKE 1 TABLET BY MOUTH  DAILY 90 tablet 3  . busPIRone (BUSPAR) 5 MG tablet Take 1 tablet (5 mg total) by mouth 2 (two) times daily. 60 tablet 7  .  Cholecalciferol (VITAMIN D-3) 5000 units TABS Take 5,000 Units by mouth daily.    . hydrochlorothiazide (HYDRODIURIL) 25 MG tablet Take 1 tablet (25 mg total) by mouth daily. 90 tablet 3  . hydroxypropyl methylcellulose / hypromellose (ISOPTO TEARS / GONIOVISC) 2.5 % ophthalmic solution Place 1 drop into both eyes 4 (four) times daily as needed for dry eyes.    . metoprolol tartrate (LOPRESSOR) 50 MG tablet Take 1 tablet (50 mg total) by mouth 2 (two) times daily. 180 tablet 1  . Multiple Vitamin (MULTIVITAMIN) tablet Take 1 tablet by mouth daily.      Marland Kitchen NITROSTAT 0.4 MG SL tablet Dissolve 1 tablet under the tongue as needed for chest  pain every 5 minutes 75 tablet 3  . thioridazine (MELLARIL) 25 MG tablet 1  qhs 30 tablet 7   No current facility-administered medications for this visit.     Lab Results: No results found for this or any previous visit (from the past 48 hour(s)).  Physical Findings: AIMS:  , ,  ,  ,    CIWA:    COWS:     Musculoskeletal: Strength & Muscle Tone: within normal limits Gait & Station: normal Patient leans:  Psychiatric Specialty Exam: ROS  Blood pressure 128/74, height 6' (1.829 m), weight 207 lb (93.9 kg).Body mass index is 28.07 kg/m.  General Appearance: Meticulous  Eye Contact::  Good  Speech:  Clear and Coherent  Volume:  Normal  Mood:  Euthymic  Affect:  Appropriate  Thought Process:  Coherent  Orientation:  Full (Time, Place, and Person)  Thought Content:  WDL  Suicidal Thoughts:  No  Homicidal Thoughts:  No  Memory:  NA  Judgement:  NA  Insight:  Good  Psychomotor Activity:  Normal  Concentration:  Good  Recall:  Good  Fund of Knowledge:Good  Language: Good  Akathisia:  No  Handed:  Right  AIMS (if indicated):     Assets:  Communication Skills  ADL's:  Intact  Cognition: WNL  Sleep:      Treatment Plan Summary: 02/13/2018, 1:25 PM  This patient's diagnosis is schizophrenia in remission.  The patient is doing very well.  His  major symptoms the past was related to psychosis which is now in remission as he takes 25 mg of Mellaril.  He demonstrates no evidence of tardive dyskinesia.  Today had a AIMS  scale which was negative.  Patient also  has significant anxiety from the past but does very well taking BuSpar just 5 mg twice daily.  The patient denies the use of drugs or alcohol.  He has a number of close friends mainly where he lives.  The patient is planning to get together with his family for the holidays.  This patient will continue taking Mellaril 25 mg and his BuSpar return to see me in 6 months.

## 2018-03-21 ENCOUNTER — Ambulatory Visit: Payer: Self-pay | Admitting: Cardiovascular Disease

## 2018-06-03 NOTE — Progress Notes (Signed)
Cardiology Office Note   Date:  06/04/2018   ID:  Gilbert Harris 08/16/1945, MRN [id number]  PCP:  Salli Real, MD  Cardiologist:   Chilton Si, MD   Chief Complaint  Patient presents with  . Follow-up    6 months.      History of Present Illness: Gilbert Harris is a 73 y.o. male with hypertension, hyperlipidemia, CAD s/p MI and CABG in 2007, mild carotid stenosis, syncope, and schizophrenia who presents for follow up.  Gilbert Harris has been feeling well.  He isn't going to the gym but he does exercise videos every day.  He has no exertional chest pain or shortness of breath.  He sometimes notices an imprint from his socks but otherwise has no edema, orthopnea, or PND.  He denies any palpitations, lightheadedness, or dizziness.  Overall he is well and is without complaints today.    Past Medical History:  Diagnosis Date  . Carotid stenosis 01/05/2016  . Coronary artery disease    CABG x4 LIMA to LAD, SV graft to RCA, sequential saphenous vein graft to diagnal and OM1  . HTN (hypertension)   . Hyperlipidemia   . MI (myocardial infarction) (HCC)   . Schizophrenia [facility])     Past Surgical History:  Procedure Laterality Date  . CARDIAC CATHETERIZATION     EF 50%--severe two vessel obstructive atherosclerotic coronary artery disease--Low normal left ventricle funcrtion  . CORONARY ARTERY BYPASS GRAFT  2007  . HERNIA REPAIR    . TONSILLECTOMY AND ADENOIDECTOMY Bilateral y-59     Current Outpatient Medications  Medication Sig Dispense Refill  . aspirin (ASPIRIN EC) 81 MG EC tablet Take 81 mg by mouth daily. Swallow whole.    Marland Kitchen atorvastatin (LIPITOR) 20 MG tablet Take 1 tablet (20 mg total) by mouth daily. 90 tablet 3  . busPIRone (BUSPAR) 5 MG tablet Take 1 tablet (5 mg total) by mouth 2 (two) times daily. 60 tablet 7  . Cholecalciferol (VITAMIN D-3) 5000 units TABS Take 5,000 Units by mouth daily.    . hydrochlorothiazide (HYDRODIURIL) 25 MG tablet Take 1 tablet (25 mg  total) by mouth daily. 90 tablet 3  . hydroxypropyl methylcellulose / hypromellose (ISOPTO TEARS / GONIOVISC) 2.5 % ophthalmic solution Place 1 drop into both eyes 4 (four) times daily as needed for dry eyes.    . metoprolol tartrate (LOPRESSOR) 50 MG tablet Take 1 tablet (50 mg total) by mouth 2 (two) times daily. 180 tablet 1  . Multiple Vitamin (MULTIVITAMIN) tablet Take 1 tablet by mouth daily.      . nitroGLYCERIN (NITROSTAT) 0.4 MG SL tablet Dissolve 1 tablet under the tongue as needed for chest  pain every 5 minutes 75 tablet 3  . thioridazine (MELLARIL) 25 MG tablet 1  qhs 30 tablet 7   No current facility-administered medications for this visit.     Allergies:   Haloperidol decanoate    Social History:  The patient  reports that he quit smoking about 12 years ago. His smoking use included cigarettes. He has never used smokeless tobacco. He reports that he does not drink alcohol or use drugs.   Family History:  The patient's family history includes Heart attack in his father and mother; Heart disease in his father, maternal grandmother, and mother; Heart failure in his maternal aunt; Stroke in his maternal grandfather.    ROS:  Please see the history of present illness.   Otherwise, review of systems are positive for  none.   All other systems are reviewed and negative.    PHYSICAL EXAM: VS:  BP 112/80 (BP Location: Left Arm, Patient Position: Sitting, Cuff Size: Normal)   Pulse 64   Ht 6' (1.829 m)   Wt 201 lb (91.2 kg)   BMI 27.26 kg/m  , BMI Body mass index is 27.26 kg/m. GENERAL:  Well appearing HEENT: Pupils equal round and reactive, fundi not visualized, oral mucosa unremarkable NECK:  No jugular venous distention, waveform within normal limits, carotid upstroke brisk and symmetric, no bruits LUNGS:  Clear to auscultation bilaterally HEART:  RRR.  PMI not displaced or sustained,S1 and S2 within normal limits, no S3, no S4, no clicks, no rubs, no murmurs ABD:  Flat,  positive bowel sounds normal in frequency in pitch, no bruits, no rebound, no guarding, no midline pulsatile mass, no hepatomegaly, no splenomegaly EXT:  2 plus pulses throughout, no edema, no cyanosis no clubbing SKIN:  No rashes no nodules NEURO:  Cranial nerves II through XII grossly intact, motor grossly intact throughout PSYCH:  Cognitively intact, oriented to person place and time    EKG:  EKG is ordered today. The ekg ordered 01/05/16 demonstrates sinus rhythm. Rate 64 bpm. nonspecific T wave abnormalities. 07/03/16: Sinus rhythm.  Rate 74 bpm.  Lateral T wave abnormalities 05/14/17: Sinus rhythm.  Rate 69 bpm. 11/29/17: Sinus rhythm.  Rate 63 bpm.  Nonspecific T wave abnormalities 06/04/18: Sinus rhythm.  Rate 64 bpm.  Nonspecific T wave abnormalities.    Recent Labs: No results found for requested labs within last 8760 hours.   Carotid Doppler 07/2015: 1-39% stenosis bilaterally   Lipid Panel    Component Value Date/Time   CHOL 132 05/24/2017 1034   TRIG 62 05/24/2017 1034   HDL 47 05/24/2017 1034   CHOLHDL 2.8 05/24/2017 1034   CHOLHDL 2.3 04/21/2016 1032   VLDL 17 04/21/2016 1032   LDLCALC 73 05/24/2017 1034      Wt Readings from Last 3 Encounters:  06/04/18 201 lb (91.2 kg)  11/29/17 202 lb (91.6 kg)  09/10/17 212 lb 3.2 oz (96.3 kg)      ASSESSMENT AND PLAN:  # Hypertension:  Blood pressure is well-controlled.  Continue HCTZ and metoprolol.  Check CMP.  # CAD s/p CABG: # Hyperlipidemia:  [name] continues to do well.  He exercises regularly and has no angina.  Continue aspirin, atorvastatin, and metoprolol.  Check lipids/CMP today.  # Carotid stenosis: Mild bilateral carotid stenosis 07/2015.  Continue aspirin and atorvastatin.     Current medicines are reviewed at length with the patient today.  The patient does not have concerns regarding medicines.  The following changes have been made:  no change  Labs/ tests ordered today include:   Orders Placed  This Encounter  Procedures  . Comprehensive metabolic panel  . Lipid panel  . EKG 12-Lead     Disposition:   FU with Xaria Judon C. Duke Salvia, MD, [facility] in 1 year.     Signed, Fay Swider C. Duke Salvia, MD, [facility]  06/04/2018 10:08 AM    [facility]

## 2018-06-04 ENCOUNTER — Ambulatory Visit (INDEPENDENT_AMBULATORY_CARE_PROVIDER_SITE_OTHER): Payer: Medicare Other | Admitting: Cardiovascular Disease

## 2018-06-04 ENCOUNTER — Encounter: Payer: Self-pay | Admitting: Cardiovascular Disease

## 2018-06-04 VITALS — BP 112/80 | HR 64 | Ht 72.0 in | Wt 201.0 lb

## 2018-06-04 DIAGNOSIS — Z5181 Encounter for therapeutic drug level monitoring: Secondary | ICD-10-CM | POA: Diagnosis not present

## 2018-06-04 DIAGNOSIS — I2510 Atherosclerotic heart disease of native coronary artery without angina pectoris: Secondary | ICD-10-CM | POA: Diagnosis not present

## 2018-06-04 DIAGNOSIS — I119 Hypertensive heart disease without heart failure: Secondary | ICD-10-CM

## 2018-06-04 DIAGNOSIS — E7800 Pure hypercholesterolemia, unspecified: Secondary | ICD-10-CM

## 2018-06-04 DIAGNOSIS — I6523 Occlusion and stenosis of bilateral carotid arteries: Secondary | ICD-10-CM

## 2018-06-04 LAB — COMPREHENSIVE METABOLIC PANEL
ALT: 32 IU/L (ref 0–44)
AST: 32 IU/L (ref 0–40)
Albumin/Globulin Ratio: 2.5 — ABNORMAL HIGH (ref 1.2–2.2)
Albumin: 4.7 g/dL (ref 3.7–4.7)
Alkaline Phosphatase: 54 IU/L (ref 39–117)
BUN/Creatinine Ratio: 14 (ref 10–24)
BUN: 18 mg/dL (ref 8–27)
Bilirubin Total: 0.5 mg/dL (ref 0.0–1.2)
CO2: 24 mmol/L (ref 20–29)
Calcium: 9.7 mg/dL (ref 8.6–10.2)
Chloride: 100 mmol/L (ref 96–106)
Creatinine, Ser: 1.29 mg/dL — ABNORMAL HIGH (ref 0.76–1.27)
GFR calc Af Amer: 64 mL/min/{1.73_m2} (ref >59)
GFR calc non Af Amer: 55 mL/min/{1.73_m2} — ABNORMAL LOW (ref >59)
Globulin, Total: 1.9 g/dL (ref 1.5–4.5)
Glucose: 92 mg/dL (ref 65–99)
Potassium: 4.5 mmol/L (ref 3.5–5.2)
Sodium: 139 mmol/L (ref 134–144)
Total Protein: 6.6 g/dL (ref 6.0–8.5)

## 2018-06-04 LAB — LIPID PANEL
Chol/HDL Ratio: 2.3 ratio (ref 0.0–5.0)
Cholesterol, Total: 144 mg/dL (ref 100–199)
HDL: 64 mg/dL (ref >39)
LDL Calculated: 68 mg/dL (ref 0–99)
Triglycerides: 61 mg/dL (ref 0–149)
VLDL Cholesterol Cal: 12 mg/dL (ref 5–40)

## 2018-06-04 MED ORDER — METOPROLOL TARTRATE 50 MG PO TABS: 50.0000 mg | ORAL_TABLET | Freq: Two times a day (BID) | ORAL | 1 refills | Status: DC

## 2018-06-04 MED ORDER — HYDROCHLOROTHIAZIDE 25 MG PO TABS: 25.0000 mg | ORAL_TABLET | Freq: Every day | ORAL | 3 refills | Status: DC

## 2018-06-04 MED ORDER — ATORVASTATIN CALCIUM 20 MG PO TABS: 20.0000 mg | ORAL_TABLET | Freq: Every day | ORAL | 3 refills | Status: DC

## 2018-06-04 MED ORDER — NITROGLYCERIN 0.4 MG SL SUBL: SUBLINGUAL_TABLET | SUBLINGUAL | 3 refills | Status: DC

## 2018-06-04 NOTE — Patient Instructions (Signed)
Medication Instructions:  Your physician recommends that you continue on your current medications as directed. Please refer to the Current Medication list given to you today.  If you need a refill on your cardiac medications before your next appointment, please call your pharmacy.   Lab work: LP/CMET TODAY   If you have labs (blood work) drawn today and your tests are completely normal, you will receive your results only by: . MyChart Message (if you have MyChart) OR . A paper copy in the mail If you have any lab test that is abnormal or we need to change your treatment, we will call you to review the results.  Testing/Procedures: NONE  Follow-Up: At [facility], you and your health needs are our priority.  As part of our continuing mission to provide you with exceptional heart care, we have created designated Provider Care Teams.  These Care Teams include your primary Cardiologist (physician) and Advanced Practice Providers (APPs -  Physician Assistants and Nurse Practitioners) who all work together to provide you with the care you need, when you need it. You will need a follow up appointment in 12 months.  Please call our office 2 months in advance to schedule this appointment.  You may see DR [place]ma or one of the following Advanced Practice Providers on your designated Care Team:   Luke Kilroy, PA-C Krista Kroeger, PA-C . Callie Goodrich, PA-C    

## 2018-07-31 ENCOUNTER — Other Ambulatory Visit: Payer: Self-pay

## 2018-07-31 ENCOUNTER — Ambulatory Visit (INDEPENDENT_AMBULATORY_CARE_PROVIDER_SITE_OTHER): Payer: Medicare Other | Admitting: Psychiatry

## 2018-07-31 ENCOUNTER — Encounter (HOSPITAL_COMMUNITY): Payer: Self-pay | Admitting: Psychiatry

## 2018-07-31 DIAGNOSIS — F200 Paranoid schizophrenia: Secondary | ICD-10-CM | POA: Diagnosis not present

## 2018-07-31 MED ORDER — BUSPIRONE HCL 5 MG PO TABS: 5.0000 mg | ORAL_TABLET | Freq: Two times a day (BID) | ORAL | 7 refills | Status: DC

## 2018-07-31 MED ORDER — THIORIDAZINE HCL 25 MG PO TABS: ORAL_TABLET | ORAL | 7 refills | Status: DC

## 2018-07-31 NOTE — Progress Notes (Signed)
Patient ID: Gilbert Harris, male   DOB: 08-04-1945, 73 y.o.   MRN: [id number] [facility] MD Progress Note  07/31/2018 1:50 PM Gilbert Harris  MRN:  [id number] Subjective: Doing well Principal Problem: Schizophrenia residual Diagreat.  Schizophrenia in remission  This patient has been treated here for years for paranoid schizophrenia and has been in remission.  He has minimal residual symptoms.  He is active and he paints any exercises.  He denies anxiety.  His depression is well controlled.  He has no symptoms of auditory visual hallucinations.  He has no evidence of paranoia.  His health is good.  Financially he is stable.  He likes where he lives at the [facility].  He is handling the concept of the virus well.  He is not frightened.  He remains positive about what is going to happen in the future.  He denies the use of alcohol or drugs.  He does have a history of heart disease.  Fortunately he has no chest pain or shortness of breath or any physical complaints.  He denies a fever or cough.   Past Medical History:  Past Medical History:  Diagnosis Date  . Carotid stenosis 01/05/2016  . Coronary artery disease    CABG x4 LIMA to LAD, SV graft to RCA, sequential saphenous vein graft to diagnal and OM1  . HTN (hypertension)   . Hyperlipidemia   . MI (myocardial infarction) (HCC)   . Schizophrenia [facility])     Past Surgical History:  Procedure Laterality Date  . CARDIAC CATHETERIZATION     EF 50%--severe two vessel obstructive atherosclerotic coronary artery disease--Low normal left ventricle funcrtion  . CORONARY ARTERY BYPASS GRAFT  2007  . HERNIA REPAIR    . TONSILLECTOMY AND ADENOIDECTOMY Bilateral y-59   Family History:  Family History  Problem Relation Age of Onset  . Heart disease Mother   . Heart attack Mother   . Heart disease Father   . Heart attack Father   . Heart disease Maternal Grandmother   . Stroke Maternal Grandfather   . Heart failure Maternal Aunt    Family  Psychiatric  History:  Social History:  Social History   Substance and Sexual Activity  Alcohol Use No  . Alcohol/week: 0.0 standard drinks     Social History   Substance and Sexual Activity  Drug Use No    Social History   Socioeconomic History  . Marital status: Single    Spouse name: Not on file  . Number of children: Not on file  . Years of education: Not on file  . Highest education level: Not on file  Occupational History  . Not on file  Social Needs  . Financial resource strain: Not on file  . Food insecurity:    Worry: Not on file    Inability: Not on file  . Transportation needs:    Medical: Not on file    Non-medical: Not on file  Tobacco Use  . Smoking status: Former Smoker    Types: Cigarettes    Last attempt to quit: 02/03/2006    Years since quitting: 12.4  . Smokeless tobacco: Never Used  Substance and Sexual Activity  . Alcohol use: No    Alcohol/week: 0.0 standard drinks  . Drug use: No  . Sexual activity: Not Currently  Lifestyle  . Physical activity:    Days per week: Not on file    Minutes per session: Not on file  . Stress: Not on file  Relationships  . Social connections:    Talks on phone: Not on file    Gets together: Not on file    Attends religious service: Not on file    Active member of club or organization: Not on file    Attends meetings of clubs or organizations: Not on file    Relationship status: Not on file  Other Topics Concern  . Not on file  Social History Narrative  . Not on file   Additional Social History:                         Sleep: Good  Appetite:  Good  Current Medications: Current Outpatient Medications  Medication Sig Dispense Refill  . aspirin (ASPIRIN EC) 81 MG EC tablet Take 81 mg by mouth daily. Swallow whole.    Marland Kitchen. atorvastatin (LIPITOR) 20 MG tablet Take 1 tablet (20 mg total) by mouth daily. 90 tablet 3  . busPIRone (BUSPAR) 5 MG tablet Take 1 tablet (5 mg total) by mouth 2 (two)  times daily. 60 tablet 7  . Cholecalciferol (VITAMIN D-3) 5000 units TABS Take 5,000 Units by mouth daily.    . hydrochlorothiazide (HYDRODIURIL) 25 MG tablet Take 1 tablet (25 mg total) by mouth daily. 90 tablet 3  . hydroxypropyl methylcellulose / hypromellose (ISOPTO TEARS / GONIOVISC) 2.5 % ophthalmic solution Place 1 drop into both eyes 4 (four) times daily as needed for dry eyes.    . metoprolol tartrate (LOPRESSOR) 50 MG tablet Take 1 tablet (50 mg total) by mouth 2 (two) times daily. 180 tablet 1  . Multiple Vitamin (MULTIVITAMIN) tablet Take 1 tablet by mouth daily.      . nitroGLYCERIN (NITROSTAT) 0.4 MG SL tablet Dissolve 1 tablet under the tongue as needed for chest  pain every 5 minutes 75 tablet 3  . thioridazine (MELLARIL) 25 MG tablet 1  qhs 30 tablet 7   No current facility-administered medications for this visit.     Lab Results: No results found for this or any previous visit (from the past 48 hour(s)).  Physical Findings: AIMS:  , ,  ,  ,    CIWA:    COWS:     Musculoskeletal: Strength & Muscle Tone: within normal limits Gait & Station: normal Patient leans:  Psychiatric Specialty Exam: ROS  There were no vitals taken for this visit.There is no height or weight on file to calculate BMI.  General Appearance: Meticulous  Eye Contact::  Good  Speech:  Clear and Coherent  Volume:  Normal  Mood:  Euthymic  Affect:  Appropriate  Thought Process:  Coherent  Orientation:  Full (Time, Place, and Person)  Thought Content:  WDL  Suicidal Thoughts:  No  Homicidal Thoughts:  No  Memory:  NA  Judgement:  NA  Insight:  Good  Psychomotor Activity:  Normal  Concentration:  Good  Recall:  Good  Fund of Knowledge:Good  Language: Good  Akathisia:  No  Handed:  Right  AIMS (if indicated):     Assets:  Communication Skills  ADL's:  Intact  Cognition: WNL  Sleep:      Treatment Plan Summary: 07/31/2018, 1:50 PM  This patient is #1 problem is paranoid schizophrenia.   He takes Mellaril 25 mg a day.  He is tolerated extremely well.  In addition to that he takes BuSpar 5 mg twice daily for anxiety.  The patient is not suicidal and is functioning extremely well.  Return  to see me in 5 months.  He is not fatigued.

## 2018-11-18 ENCOUNTER — Other Ambulatory Visit: Payer: Self-pay | Admitting: Cardiovascular Disease

## 2018-12-25 ENCOUNTER — Other Ambulatory Visit (HOSPITAL_COMMUNITY): Payer: Self-pay | Admitting: Psychiatry

## 2019-01-01 ENCOUNTER — Ambulatory Visit (INDEPENDENT_AMBULATORY_CARE_PROVIDER_SITE_OTHER): Payer: Medicare Other | Admitting: Psychiatry

## 2019-01-01 ENCOUNTER — Other Ambulatory Visit: Payer: Self-pay

## 2019-01-01 DIAGNOSIS — F200 Paranoid schizophrenia: Secondary | ICD-10-CM | POA: Diagnosis not present

## 2019-01-01 MED ORDER — BUSPIRONE HCL 5 MG PO TABS: 5.0000 mg | ORAL_TABLET | Freq: Two times a day (BID) | ORAL | 7 refills | Status: DC

## 2019-01-01 MED ORDER — THIORIDAZINE HCL 25 MG PO TABS: ORAL_TABLET | ORAL | 7 refills | Status: DC

## 2019-01-01 NOTE — Progress Notes (Signed)
Patient ID: Gilbert Harris, male   DOB: August 30, 1945, 73 y.o.   MRN: [id number] [facility] MD Progress Note  01/01/2019 2:30 PM Gilbert Harris  MRN:  [id number] Subjective: Doing well Principal Problem: Schizophrenia residual Diagreat.  Schizophrenia in remission  The patient has been treated for schizophrenia is doing very well. He is positive and optimistic. He continues to sketch. He sleeps it is well has no evidence of psychosis. Is followed closely by his primary care Dr. His no complaints. He has not fatigue. His thoughts are clear and organized. His health is good. Financially he is stable. He likes he environment is living in.  Past Medical History:  Past Medical History:  Diagnosis Date  . Carotid stenosis 01/05/2016  . Coronary artery disease    CABG x4 LIMA to LAD, SV graft to RCA, sequential saphenous vein graft to diagnal and OM1  . HTN (hypertension)   . Hyperlipidemia   . MI (myocardial infarction) (HCC)   . Schizophrenia [facility])     Past Surgical History:  Procedure Laterality Date  . CARDIAC CATHETERIZATION     EF 50%--severe two vessel obstructive atherosclerotic coronary artery disease--Low normal left ventricle funcrtion  . CORONARY ARTERY BYPASS GRAFT  2007  . HERNIA REPAIR    . TONSILLECTOMY AND ADENOIDECTOMY Bilateral y-59   Family History:  Family History  Problem Relation Age of Onset  . Heart disease Mother   . Heart attack Mother   . Heart disease Father   . Heart attack Father   . Heart disease Maternal Grandmother   . Stroke Maternal Grandfather   . Heart failure Maternal Aunt    Family Psychiatric  History:  Social History:  Social History   Substance and Sexual Activity  Alcohol Use No  . Alcohol/week: 0.0 standard drinks     Social History   Substance and Sexual Activity  Drug Use No    Social History   Socioeconomic History  . Marital status: Single    Spouse name: Not on file  . Number of children: Not on file  . Years of education: Not on  file  . Highest education level: Not on file  Occupational History  . Not on file  Social Needs  . Financial resource strain: Not on file  . Food insecurity    Worry: Not on file    Inability: Not on file  . Transportation needs    Medical: Not on file    Non-medical: Not on file  Tobacco Use  . Smoking status: Former Smoker    Types: Cigarettes    Quit date: 02/03/2006    Years since quitting: 12.9  . Smokeless tobacco: Never Used  Substance and Sexual Activity  . Alcohol use: No    Alcohol/week: 0.0 standard drinks  . Drug use: No  . Sexual activity: Not Currently  Lifestyle  . Physical activity    Days per week: Not on file    Minutes per session: Not on file  . Stress: Not on file  Relationships  . Social Musician on phone: Not on file    Gets together: Not on file    Attends religious service: Not on file    Active member of club or organization: Not on file    Attends meetings of clubs or organizations: Not on file    Relationship status: Not on file  Other Topics Concern  . Not on file  Social History Narrative  . Not on file  Additional Social History:                         Sleep: Good  Appetite:  Good  Current Medications: Current Outpatient Medications  Medication Sig Dispense Refill  . aspirin (ASPIRIN EC) 81 MG EC tablet Take 81 mg by mouth daily. Swallow whole.    Marland Kitchen atorvastatin (LIPITOR) 20 MG tablet Take 1 tablet (20 mg total) by mouth daily. 90 tablet 3  . busPIRone (BUSPAR) 5 MG tablet Take 1 tablet (5 mg total) by mouth 2 (two) times daily. 60 tablet 7  . Cholecalciferol (VITAMIN D-3) 5000 units TABS Take 5,000 Units by mouth daily.    . hydrochlorothiazide (HYDRODIURIL) 25 MG tablet Take 1 tablet (25 mg total) by mouth daily. 90 tablet 3  . hydroxypropyl methylcellulose / hypromellose (ISOPTO TEARS / GONIOVISC) 2.5 % ophthalmic solution Place 1 drop into both eyes 4 (four) times daily as needed for dry eyes.    .  metoprolol tartrate (LOPRESSOR) 50 MG tablet TAKE 1 TABLET BY MOUTH TWO  TIMES DAILY 180 tablet 1  . Multiple Vitamin (MULTIVITAMIN) tablet Take 1 tablet by mouth daily.      . nitroGLYCERIN (NITROSTAT) 0.4 MG SL tablet Dissolve 1 tablet under the tongue as needed for chest  pain every 5 minutes 75 tablet 3  . thioridazine (MELLARIL) 25 MG tablet 1  qhs 30 tablet 7   No current facility-administered medications for this visit.     Lab Results: No results found for this or any previous visit (from the past 48 hour(s)).  Physical Findings: AIMS:  , ,  ,  ,    CIWA:    COWS:     Musculoskeletal: Strength & Muscle Tone: within normal limits Gait & Station: normal Patient leans:  Psychiatric Specialty Exam: ROS  There were no vitals taken for this visit.There is no height or weight on file to calculate BMI.  General Appearance: Meticulous  Eye Contact::  Good  Speech:  Clear and Coherent  Volume:  Normal  Mood:  Euthymic  Affect:  Appropriate  Thought Process:  Coherent  Orientation:  Full (Time, Place, and Person)  Thought Content:  WDL  Suicidal Thoughts:  No  Homicidal Thoughts:  No  Memory:  NA  Judgement:  NA  Insight:  Good  Psychomotor Activity:  Normal  Concentration:  Good  Recall:  Good  Fund of Knowledge:Good  Language: Good  Akathisia:  No  Handed:  Right  AIMS (if indicated):     Assets:  Communication Skills  ADL's:  Intact  Cognition: WNL  Sleep:      Treatment Plan Summary: 01/01/2019, 2:30 PM  Is patient's first problem was paranoid schizophrenia. At this time is asymptomatic. He doesn't have positive symptoms and very few negative symptoms. He takes Mellaril 25 mg which will continue. He also takes BuSpar 5 mg b.i.d. which will continue. The patient is not suicidal. He is functioning extremely well. Return to see me in 6 months.

## 2019-02-15 ENCOUNTER — Other Ambulatory Visit: Payer: Self-pay | Admitting: Cardiovascular Disease

## 2019-05-22 ENCOUNTER — Ambulatory Visit: Payer: Medicare Other | Attending: Internal Medicine

## 2019-05-22 DIAGNOSIS — Z23 Encounter for immunization: Secondary | ICD-10-CM | POA: Insufficient documentation

## 2019-05-22 NOTE — Progress Notes (Signed)
   Covid-19 Vaccination Clinic  Name:  TERREON EKHOLM    MRN: [id number] DOB: 05/16/1945  05/22/2019  Mr. Maroney was observed post Covid-19 immunization for 15 minutes without incidence. He was provided with Vaccine Information Sheet and instruction to access the V-Safe system.   Mr. Malena was instructed to call 911 with any severe reactions post vaccine: Marland Kitchen Difficulty breathing  . Swelling of your face and throat  . A fast heartbeat  . A bad rash all over your body  . Dizziness and weakness    Immunizations Administered    Name Date Dose VIS Date Route   Pfizer COVID-19 Vaccine 05/22/2019  1:18 PM 0.3 mL 03/07/2019 Intramuscular   Manufacturer: [employer], [employer]   Lot: [id number]   NDC: [address]-1

## 2019-06-17 ENCOUNTER — Ambulatory Visit: Payer: Medicare Other | Attending: Internal Medicine

## 2019-06-17 DIAGNOSIS — Z23 Encounter for immunization: Secondary | ICD-10-CM

## 2019-06-17 NOTE — Progress Notes (Signed)
   TRVUY-23 Vaccination Clinic  Name:  Gilbert Harris.    MRN: [id number] DOB: Aug 08, 1945  06/17/2019  [name] was observed post Covid-19 immunization for 15 minutes without incident. He was provided with Vaccine Information Sheet and instruction to access the V-Safe system.   [name] was instructed to call 911 with any severe reactions post vaccine: Marland Kitchen Difficulty breathing  . Swelling of face and throat  . A fast heartbeat  . A bad rash all over body  . Dizziness and weakness   Immunizations Administered    Name Date Dose VIS Date Route   Pfizer COVID-19 Vaccine 06/17/2019  8:57 AM 0.3 mL 03/07/2019 Intramuscular   Manufacturer: [employer], [employer]   Lot: [id number]   NDC: [address]-0

## 2019-07-04 ENCOUNTER — Ambulatory Visit (INDEPENDENT_AMBULATORY_CARE_PROVIDER_SITE_OTHER): Payer: Medicare Other | Admitting: Cardiovascular Disease

## 2019-07-04 ENCOUNTER — Other Ambulatory Visit: Payer: Self-pay

## 2019-07-04 ENCOUNTER — Encounter: Payer: Self-pay | Admitting: Cardiovascular Disease

## 2019-07-04 VITALS — BP 130/84 | HR 72 | Ht 72.0 in | Wt 205.6 lb

## 2019-07-04 DIAGNOSIS — E7800 Pure hypercholesterolemia, unspecified: Secondary | ICD-10-CM

## 2019-07-04 DIAGNOSIS — I6523 Occlusion and stenosis of bilateral carotid arteries: Secondary | ICD-10-CM | POA: Diagnosis not present

## 2019-07-04 DIAGNOSIS — Z5181 Encounter for therapeutic drug level monitoring: Secondary | ICD-10-CM | POA: Diagnosis not present

## 2019-07-04 DIAGNOSIS — I10 Essential (primary) hypertension: Secondary | ICD-10-CM

## 2019-07-04 DIAGNOSIS — I119 Hypertensive heart disease without heart failure: Secondary | ICD-10-CM

## 2019-07-04 DIAGNOSIS — I2510 Atherosclerotic heart disease of native coronary artery without angina pectoris: Secondary | ICD-10-CM

## 2019-07-04 NOTE — Progress Notes (Signed)
Cardiology Office Note   Date:  07/04/2019   ID:  Gilbert Tiberio., DOB Jun 22, 1945, MRN [id number]  PCP:  Salli Real, MD  Cardiologist:   Chilton Si, MD   No chief complaint on file.    History of Present Illness: Gilbert Bail. is a 74 y.o. male with hypertension, hyperlipidemia, CAD s/p MI and CABG in 2007, mild carotid stenosis, syncope, and schizophrenia who presents for follow up.  He continues to do well.  He had his last coronavirus vaccine 1 week ago.  He has not been exercising as much lately.  He denies any chest pain or shortness of breath.  He has not experienced any lower extremity edema, orthopnea, or PND.  He reports that his diet is good and that he tries to have lots of fruits and vegetables.  Overall he is without complaint.  He had his blood pressure checked recently and reports that it was controlled.   Past Medical History:  Diagnosis Date  . Carotid stenosis 01/05/2016  . Coronary artery disease    CABG x4 LIMA to LAD, SV graft to RCA, sequential saphenous vein graft to diagnal and OM1  . HTN (hypertension)   . Hyperlipidemia   . MI (myocardial infarction) (HCC)   . Schizophrenia [facility])     Past Surgical History:  Procedure Laterality Date  . CARDIAC CATHETERIZATION     EF 50%--severe two vessel obstructive atherosclerotic coronary artery disease--Low normal left ventricle funcrtion  . CORONARY ARTERY BYPASS GRAFT  2007  . HERNIA REPAIR    . TONSILLECTOMY AND ADENOIDECTOMY Bilateral y-59     Current Outpatient Medications  Medication Sig Dispense Refill  . aspirin (ASPIRIN EC) 81 MG EC tablet Take 81 mg by mouth daily. Swallow whole.    Marland Kitchen atorvastatin (LIPITOR) 20 MG tablet Take 1 tablet (20 mg total) by mouth daily. 90 tablet 3  . busPIRone (BUSPAR) 5 MG tablet Take 1 tablet (5 mg total) by mouth 2 (two) times daily. 60 tablet 7  . Cholecalciferol (VITAMIN D-3) 5000 units TABS Take 5,000 Units by mouth daily.    . hydrochlorothiazide  (HYDRODIURIL) 25 MG tablet Take 1 tablet (25 mg total) by mouth daily. 90 tablet 3  . hydroxypropyl methylcellulose / hypromellose (ISOPTO TEARS / GONIOVISC) 2.5 % ophthalmic solution Place 1 drop into both eyes 4 (four) times daily as needed for dry eyes.    . metoprolol tartrate (LOPRESSOR) 50 MG tablet TAKE 1 TABLET BY MOUTH TWO  TIMES DAILY 180 tablet 1  . Multiple Vitamin (MULTIVITAMIN) tablet Take 1 tablet by mouth daily.      . nitroGLYCERIN (NITROSTAT) 0.4 MG SL tablet DISSOLVE 1 TABLET UNDER THE TONGUE EVERY 5 MINUTES AS  NEEDED FOR CHEST PAIN UP TO 3 TABLETS IN 15 MINUTES,  CALL 911 IF PAIN PERSISTS 100 tablet 3  . thioridazine (MELLARIL) 25 MG tablet 1  qhs 30 tablet 7   No current facility-administered medications for this visit.    Allergies:   Haloperidol decanoate    Social History:  The patient  reports that he quit smoking about 13 years ago. His smoking use included cigarettes. He has never used smokeless tobacco. He reports that he does not drink alcohol or use drugs.   Family History:  The patient's family history includes Heart attack in his father and mother; Heart disease in his father, maternal grandmother, and mother; Heart failure in his maternal aunt; Stroke in his maternal grandfather.  ROS:  Please see the history of present illness.   Otherwise, review of systems are positive for none.   All other systems are reviewed and negative.    PHYSICAL EXAM: VS:  BP 130/84   Pulse 72   Ht 6' (1.829 m)   Wt 205 lb 9.6 oz (93.3 kg)   SpO2 96%   BMI 27.88 kg/m  , BMI Body mass index is 27.88 kg/m. GENERAL:  Well appearing HEENT: Pupils equal round and reactive, fundi not visualized, oral mucosa unremarkable NECK:  No jugular venous distention, waveform within normal limits, carotid upstroke brisk and symmetric, no bruits LUNGS:  Clear to auscultation bilaterally HEART:  RRR.  PMI not displaced or sustained,S1 and S2 within normal limits, no S3, no S4, no clicks,  no rubs, no murmurs ABD:  Flat, positive bowel sounds normal in frequency in pitch, no bruits, no rebound, no guarding, no midline pulsatile mass, no hepatomegaly, no splenomegaly EXT:  2 plus pulses throughout, no edema, no cyanosis no clubbing SKIN:  No rashes no nodules NEURO:  Cranial nerves II through XII grossly intact, motor grossly intact throughout PSYCH:  Cognitively intact, oriented to person place and time    EKG:  EKG is ordered today. The ekg ordered 01/05/16 demonstrates sinus rhythm. Rate 64 bpm. nonspecific T wave abnormalities. 07/03/16: Sinus rhythm.  Rate 74 bpm.  Lateral T wave abnormalities 05/14/17: Sinus rhythm.  Rate 69 bpm. 11/29/17: Sinus rhythm.  Rate 63 bpm.  Nonspecific T wave abnormalities 06/04/18: Sinus rhythm.  Rate 64 bpm.  Nonspecific T wave abnormalities.  07/04/2019: Sinus rhythm.  Rate 72 bpm. Nonspecific T wave abnormalities.  Recent Labs: No results found for requested labs within last 8760 hours.   Carotid Doppler 07/2015: 1-39% stenosis bilaterally   Lipid Panel    Component Value Date/Time   CHOL 144 06/04/2018 1014   TRIG 61 06/04/2018 1014   HDL 64 06/04/2018 1014   CHOLHDL 2.3 06/04/2018 1014   CHOLHDL 2.3 04/21/2016 1032   VLDL 17 04/21/2016 1032   LDLCALC 68 06/04/2018 1014      Wt Readings from Last 3 Encounters:  07/04/19 205 lb 9.6 oz (93.3 kg)  06/04/18 201 lb (91.2 kg)  11/29/17 202 lb (91.6 kg)      ASSESSMENT AND PLAN:  # Hypertension:  Blood pressure slightly above goal both initially and on repeat.  I suspect that it would be better if he would start back exercising.  We discussed importance of getting back into his exercise routine.  He should be getting 150 minutes weekly.  Continue metoprolol and hydrochlorothiazide.  # CAD s/p CABG: # Hyperlipidemia:  Gilbert Harris continues to do well.  \He has no angina but is not exercising.  We did suggest that he resume exercise as above.  Continue aspirin, atorvastatin, and  metoprolol.  Check lipids and a CMP today.  # Carotid stenosis: Mild bilateral carotid stenosis 07/2015.  We will repeat carotid Dopplers.  Continue aspirin and atorvastatin.      Current medicines are reviewed at length with the patient today.  The patient does not have concerns regarding medicines.  The following changes have been made:  no change  Labs/ tests ordered today include:   Orders Placed This Encounter  Procedures  . Lipid panel  . Comprehensive metabolic panel  . EKG 12-Lead  . VAS US CAROTID     Disposition:   FU with Gilbert Trebilcock C. Oval Linsey, MD, [facility] in 1 year.  Signed, Gilbert Radu C. Duke Salvia, MD, [facility]  07/04/2019 1:13 PM    [facility]

## 2019-07-04 NOTE — Patient Instructions (Addendum)
Medication Instructions:  Your physician recommends that you continue on your current medications as directed. Please refer to the Current Medication list given to you today.  *If you need a refill on your cardiac medications before your next appointment, please call your pharmacy*  Lab Work: LP/CMET TODAY   If you have labs (blood work) drawn today and your tests are completely normal, you will receive your results only by: Marland Kitchen MyChart Message (if you have MyChart) OR . A paper copy in the mail If you have any lab test that is abnormal or we need to change your treatment, we will call you to review the results.  Testing/Procedures: Your physician has requested that you have a carotid duplex. This test is an ultrasound of the carotid arteries in your neck. It looks at blood flow through these arteries that supply the brain with blood. Allow one hour for this exam. There are no restrictions or special instructions.  Follow-Up: At [facility], you and your health needs are our priority.  As part of our continuing mission to provide you with exceptional heart care, we have created designated Provider Care Teams.  These Care Teams include your primary Cardiologist (physician) and Advanced Practice Providers (APPs -  Physician Assistants and Nurse Practitioners) who all work together to provide you with the care you need, when you need it.  We recommend signing up for the patient portal called "MyChart".  Sign up information is provided on this After Visit Summary.  MyChart is used to connect with patients for Virtual Visits (Telemedicine).  Patients are able to view lab/test results, encounter notes, upcoming appointments, etc.  Non-urgent messages can be sent to your provider as well.   To learn more about what you can do with MyChart, go to [email or web address].    Your next appointment:   12 month(s)  You will receive a reminder letter in the mail two months in advance. If you don't receive  a letter, please call our office to schedule the follow-up appointment.  The format for your next appointment:   In Person  Provider:   You may see DR [facility]  or one of the following Advanced Practice Providers on your designated Care Team:    Corine Shelter, PA-C  [place], [place]  Edd Fabian, FNP  INCREASE YOUR EXERCISE WITH A GOAL OF 150 MINUTES EACH WEEK

## 2019-07-05 LAB — COMPREHENSIVE METABOLIC PANEL
ALT: 32 IU/L (ref 0–44)
AST: 30 IU/L (ref 0–40)
Albumin/Globulin Ratio: 1.9 (ref 1.2–2.2)
Albumin: 4.4 g/dL (ref 3.7–4.7)
Alkaline Phosphatase: 57 IU/L (ref 39–117)
BUN/Creatinine Ratio: 8 — ABNORMAL LOW (ref 10–24)
BUN: 11 mg/dL (ref 8–27)
Bilirubin Total: 0.7 mg/dL (ref 0.0–1.2)
CO2: 26 mmol/L (ref 20–29)
Calcium: 9.7 mg/dL (ref 8.6–10.2)
Chloride: 102 mmol/L (ref 96–106)
Creatinine, Ser: 1.34 mg/dL — ABNORMAL HIGH (ref 0.76–1.27)
GFR calc Af Amer: 60 mL/min/{1.73_m2} (ref >59)
GFR calc non Af Amer: 52 mL/min/{1.73_m2} — ABNORMAL LOW (ref >59)
Globulin, Total: 2.3 g/dL (ref 1.5–4.5)
Glucose: 91 mg/dL (ref 65–99)
Potassium: 4.2 mmol/L (ref 3.5–5.2)
Sodium: 141 mmol/L (ref 134–144)
Total Protein: 6.7 g/dL (ref 6.0–8.5)

## 2019-07-05 LAB — LIPID PANEL
Chol/HDL Ratio: 2.6 ratio (ref 0.0–5.0)
Cholesterol, Total: 145 mg/dL (ref 100–199)
HDL: 56 mg/dL (ref >39)
LDL Chol Calc (NIH): 73 mg/dL (ref 0–99)
Triglycerides: 83 mg/dL (ref 0–149)
VLDL Cholesterol Cal: 16 mg/dL (ref 5–40)

## 2019-07-09 ENCOUNTER — Ambulatory Visit (INDEPENDENT_AMBULATORY_CARE_PROVIDER_SITE_OTHER): Payer: Medicare Other | Admitting: Psychiatry

## 2019-07-09 ENCOUNTER — Other Ambulatory Visit: Payer: Self-pay

## 2019-07-09 DIAGNOSIS — F4322 Adjustment disorder with anxiety: Secondary | ICD-10-CM | POA: Diagnosis not present

## 2019-07-09 DIAGNOSIS — F200 Paranoid schizophrenia: Secondary | ICD-10-CM

## 2019-07-09 MED ORDER — BUSPIRONE HCL 5 MG PO TABS: 5.0000 mg | ORAL_TABLET | Freq: Two times a day (BID) | ORAL | 7 refills | Status: DC

## 2019-07-09 MED ORDER — THIORIDAZINE HCL 25 MG PO TABS: ORAL_TABLET | ORAL | 7 refills | Status: DC

## 2019-07-09 NOTE — Progress Notes (Signed)
Patient ID: Gilbert Hummer., male   DOB: 1945-11-26, 74 y.o.   MRN: [id number] [facility] MD Progress Note  07/09/2019 4:23 PM Gilbert Hummer.  MRN:  [id number] Subjective: Doing well Principal Problem: Schizophrenia residual Diagreat.  Schizophrenia in remission  This patient has paranoid schizophrenia which is in remission.  He has no psychotic symptoms at all.  He lives independently.  He does a lot of art work.  He is articulate and intelligent his thoughts are clear and organized.  Patient has no signs of delusional material.  He has no hallucinations.  His mood is good.  He is sleeping and eating well has good energy.  He likes the place where he is living.  He drinks no alcohol uses no drugs.  He is very compliant with his medicines.  He recently saw his cardiologist and had a good report.  The patient walks a lot.  He is very focused and active.  He is functioning very well. Past Medical History:  Past Medical History:  Diagnosis Date  . Carotid stenosis 01/05/2016  . Coronary artery disease    CABG x4 LIMA to LAD, SV graft to RCA, sequential saphenous vein graft to diagnal and OM1  . HTN (hypertension)   . Hyperlipidemia   . MI (myocardial infarction) ([place])   . Schizophrenia [facility])     Past Surgical History:  Procedure Laterality Date  . CARDIAC CATHETERIZATION     EF 50%--severe two vessel obstructive atherosclerotic coronary artery disease--Low normal left ventricle funcrtion  . CORONARY ARTERY BYPASS GRAFT  2007  . HERNIA REPAIR    . TONSILLECTOMY AND ADENOIDECTOMY Bilateral y-59   Family History:  Family History  Problem Relation Age of Onset  . Heart disease Mother   . Heart attack Mother   . Heart disease Father   . Heart attack Father   . Heart disease Maternal Grandmother   . Stroke Maternal Grandfather   . Heart failure Maternal Aunt    Family Psychiatric  History:  Social History:  Social History   Substance and Sexual Activity  Alcohol Use No  .  Alcohol/week: 0.0 standard drinks     Social History   Substance and Sexual Activity  Drug Use No    Social History   Socioeconomic History  . Marital status: Single    Spouse name: Not on file  . Number of children: Not on file  . Years of education: Not on file  . Highest education level: Not on file  Occupational History  . Not on file  Tobacco Use  . Smoking status: Former Smoker    Types: Cigarettes    Quit date: 02/03/2006    Years since quitting: 13.4  . Smokeless tobacco: Never Used  Substance and Sexual Activity  . Alcohol use: No    Alcohol/week: 0.0 standard drinks  . Drug use: No  . Sexual activity: Not Currently  Other Topics Concern  . Not on file  Social History Narrative  . Not on file   Social Determinants of Health   Financial Resource Strain:   . Difficulty of Paying Living Expenses:   Food Insecurity:   . Worried About Charity fundraiser in the Last Year:   . Arboriculturist in the Last Year:   Transportation Needs:   . Film/video editor (Medical):   Marland Kitchen Lack of Transportation (Non-Medical):   Physical Activity:   . Days of Exercise per Week:   . Minutes of  Exercise per Session:   Stress:   . Feeling of Stress :   Social Connections:   . Frequency of Communication with Friends and Family:   . Frequency of Social Gatherings with Friends and Family:   . Attends Religious Services:   . Active Member of Clubs or Organizations:   . Attends Banker Meetings:   Marland Kitchen Marital Status:    Additional Social History:                         Sleep: Good  Appetite:  Good  Current Medications: Current Outpatient Medications  Medication Sig Dispense Refill  . aspirin (ASPIRIN EC) 81 MG EC tablet Take 81 mg by mouth daily. Swallow whole.    Marland Kitchen atorvastatin (LIPITOR) 20 MG tablet Take 1 tablet (20 mg total) by mouth daily. 90 tablet 3  . busPIRone (BUSPAR) 5 MG tablet Take 1 tablet (5 mg total) by mouth 2 (two) times daily.  60 tablet 7  . Cholecalciferol (VITAMIN D-3) 5000 units TABS Take 5,000 Units by mouth daily.    . hydrochlorothiazide (HYDRODIURIL) 25 MG tablet Take 1 tablet (25 mg total) by mouth daily. 90 tablet 3  . hydroxypropyl methylcellulose / hypromellose (ISOPTO TEARS / GONIOVISC) 2.5 % ophthalmic solution Place 1 drop into both eyes 4 (four) times daily as needed for dry eyes.    . metoprolol tartrate (LOPRESSOR) 50 MG tablet TAKE 1 TABLET BY MOUTH TWO  TIMES DAILY 180 tablet 1  . Multiple Vitamin (MULTIVITAMIN) tablet Take 1 tablet by mouth daily.      . nitroGLYCERIN (NITROSTAT) 0.4 MG SL tablet DISSOLVE 1 TABLET UNDER THE TONGUE EVERY 5 MINUTES AS  NEEDED FOR CHEST PAIN UP TO 3 TABLETS IN 15 MINUTES,  CALL 911 IF PAIN PERSISTS 100 tablet 3  . thioridazine (MELLARIL) 25 MG tablet 1  qhs 30 tablet 7   No current facility-administered medications for this visit.    Lab Results: No results found for this or any previous visit (from the past 48 hour(s)).  Physical Findings: AIMS:  , ,  ,  ,    CIWA:    COWS:     Musculoskeletal: Strength & Muscle Tone: within normal limits Gait & Station: normal Patient leans:  Psychiatric Specialty Exam: ROS  There were no vitals taken for this visit.There is no height or weight on file to calculate BMI.  General Appearance: Meticulous  Eye Contact::  Good  Speech:  Clear and Coherent  Volume:  Normal  Mood:  Euthymic  Affect:  Appropriate  Thought Process:  Coherent  Orientation:  Full (Time, Place, and Person)  Thought Content:  WDL  Suicidal Thoughts:  No  Homicidal Thoughts:  No  Memory:  NA  Judgement:  NA  Insight:  Good  Psychomotor Activity:  Normal  Concentration:  Good  Recall:  Good  Fund of Knowledge:Good  Language: Good  Akathisia:  No  Handed:  Right  AIMS (if indicated):     Assets:  Communication Skills  ADL's:  Intact  Cognition: WNL  Sleep:      Treatment Plan Summary: 07/09/2019, 4:23 PM  This patient's first  problem is paranoid schizophrenia in remission.  Patient continue taking Mellaril 25 mg at night.  His second problem is adjustment disorder with an anxious mood state.  He is taking BuSpar 5 mg twice daily.  His anxiety is much improved.  He is functioning extremely well.  Patient return  to see me in a follow-up appointment in 5 months.Marland Kitchen

## 2019-07-14 ENCOUNTER — Ambulatory Visit (HOSPITAL_COMMUNITY)
Admission: RE | Admit: 2019-07-14 | Discharge: 2019-07-14 | Disposition: A | Discharge status: Home / Self Care | Payer: Medicare Other | Source: Ambulatory Visit | Attending: Cardiovascular Disease | Admitting: Cardiovascular Disease

## 2019-07-14 ENCOUNTER — Other Ambulatory Visit: Payer: Self-pay

## 2019-07-14 DIAGNOSIS — I6523 Occlusion and stenosis of bilateral carotid arteries: Secondary | ICD-10-CM | POA: Diagnosis present

## 2019-07-14 DIAGNOSIS — E7800 Pure hypercholesterolemia, unspecified: Secondary | ICD-10-CM

## 2019-07-21 ENCOUNTER — Other Ambulatory Visit: Payer: Self-pay | Admitting: Cardiovascular Disease

## 2019-10-02 NOTE — Telephone Encounter (Signed)
In error

## 2019-12-10 ENCOUNTER — Other Ambulatory Visit: Payer: Self-pay

## 2019-12-10 ENCOUNTER — Ambulatory Visit (INDEPENDENT_AMBULATORY_CARE_PROVIDER_SITE_OTHER): Payer: Medicare Other | Admitting: Psychiatry

## 2019-12-10 DIAGNOSIS — F200 Paranoid schizophrenia: Secondary | ICD-10-CM | POA: Diagnosis not present

## 2019-12-10 MED ORDER — THIORIDAZINE HCL 25 MG PO TABS: ORAL_TABLET | ORAL | 7 refills | Status: DC

## 2019-12-10 MED ORDER — BUSPIRONE HCL 5 MG PO TABS: 5.0000 mg | ORAL_TABLET | Freq: Two times a day (BID) | ORAL | 7 refills | Status: DC

## 2019-12-10 NOTE — Progress Notes (Signed)
Patient ID: Gilbert Harris., male   DOB: April 16, 1945, 74 y.o.   MRN: [id number] [facility] MD Progress Note  12/10/2019 2:41 PM Gilbert Harris.  MRN:  [id number] Subjective: Doing well Principal Problem: Schizophrenia residual Diagreat.  Schizophrenia in remission  This patient is carries a diagnosis of paranoid schizophrenia.  He is in remission.  He does very well on low-dose Mellaril 25 mg.  Medically he is very healthy.  He denies any psychotic symptomatology at this time.  He has very few negative symptoms.  He is active he does a lot of art.  He lives independently.  The patient likes to walk.  Patient is very clear in her speaking has no evidence of a thought disorder.  He is not delusional.  He denies being depressed.  He is sleeping and eating very well.  He sees a cardiologist but he has few cardiac symptoms symptoms at this time.  Medically he is very stable.  He lives at the [facility] with other patients.  Patient has no evidence of significant anxiety. Past Medical History:  Diagnosis Date  . Carotid stenosis 01/05/2016  . Coronary artery disease    CABG x4 LIMA to LAD, SV graft to RCA, sequential saphenous vein graft to diagnal and OM1  . HTN (hypertension)   . Hyperlipidemia   . MI (myocardial infarction) (HCC)   . Schizophrenia [facility])     Past Surgical History:  Procedure Laterality Date  . CARDIAC CATHETERIZATION     EF 50%--severe two vessel obstructive atherosclerotic coronary artery disease--Low normal left ventricle funcrtion  . CORONARY ARTERY BYPASS GRAFT  2007  . HERNIA REPAIR    . TONSILLECTOMY AND ADENOIDECTOMY Bilateral y-59   Family History:  Family History  Problem Relation Age of Onset  . Heart disease Mother   . Heart attack Mother   . Heart disease Father   . Heart attack Father   . Heart disease Maternal Grandmother   . Stroke Maternal Grandfather   . Heart failure Maternal Aunt    Family Psychiatric  History:  Social History:  Social History    Substance and Sexual Activity  Alcohol Use No  . Alcohol/week: 0.0 standard drinks     Social History   Substance and Sexual Activity  Drug Use No    Social History   Socioeconomic History  . Marital status: Single    Spouse name: Not on file  . Number of children: Not on file  . Years of education: Not on file  . Highest education level: Not on file  Occupational History  . Not on file  Tobacco Use  . Smoking status: Former Smoker    Types: Cigarettes    Quit date: 02/03/2006    Years since quitting: 13.8  . Smokeless tobacco: Never Used  Vaping Use  . Vaping Use: Never used  Substance and Sexual Activity  . Alcohol use: No    Alcohol/week: 0.0 standard drinks  . Drug use: No  . Sexual activity: Not Currently  Other Topics Concern  . Not on file  Social History Narrative  . Not on file   Social Determinants of Health   Financial Resource Strain:   . Difficulty of Paying Living Expenses: Not on file  Food Insecurity:   . Worried About Programme researcher, broadcasting/film/video in the Last Year: Not on file  . Ran Out of Food in the Last Year: Not on file  Transportation Needs:   . Lack of Transportation (  Medical): Not on file  . Lack of Transportation (Non-Medical): Not on file  Physical Activity:   . Days of Exercise per Week: Not on file  . Minutes of Exercise per Session: Not on file  Stress:   . Feeling of Stress : Not on file  Social Connections:   . Frequency of Communication with Friends and Family: Not on file  . Frequency of Social Gatherings with Friends and Family: Not on file  . Attends Religious Services: Not on file  . Active Member of Clubs or Organizations: Not on file  . Attends Banker Meetings: Not on file  . Marital Status: Not on file   Additional Social History:                         Sleep: Good  Appetite:  Good  Current Medications: Current Outpatient Medications  Medication Sig Dispense Refill  . aspirin (ASPIRIN EC)  81 MG EC tablet Take 81 mg by mouth daily. Swallow whole.    Marland Kitchen atorvastatin (LIPITOR) 20 MG tablet Take 1 tablet (20 mg total) by mouth daily. 90 tablet 3  . busPIRone (BUSPAR) 5 MG tablet Take 1 tablet (5 mg total) by mouth 2 (two) times daily. 60 tablet 7  . Cholecalciferol (VITAMIN D-3) 5000 units TABS Take 5,000 Units by mouth daily.    . hydrochlorothiazide (HYDRODIURIL) 25 MG tablet TAKE 1 TABLET BY MOUTH ONCE DAILY 90 tablet 3  . hydroxypropyl methylcellulose / hypromellose (ISOPTO TEARS / GONIOVISC) 2.5 % ophthalmic solution Place 1 drop into both eyes 4 (four) times daily as needed for dry eyes.    . metoprolol tartrate (LOPRESSOR) 50 MG tablet TAKE 1 TABLET BY MOUTH TWO  TIMES DAILY 180 tablet 1  . Multiple Vitamin (MULTIVITAMIN) tablet Take 1 tablet by mouth daily.      . nitroGLYCERIN (NITROSTAT) 0.4 MG SL tablet DISSOLVE 1 TABLET UNDER THE TONGUE EVERY 5 MINUTES AS  NEEDED FOR CHEST PAIN UP TO 3 TABLETS IN 15 MINUTES,  CALL 911 IF PAIN PERSISTS 100 tablet 3  . thioridazine (MELLARIL) 25 MG tablet 1  qhs 30 tablet 7   No current facility-administered medications for this visit.    Lab Results: No results found for this or any previous visit (from the past 48 hour(s)).  Physical Findings: AIMS:  , ,  ,  ,    CIWA:    COWS:     Musculoskeletal: Strength & Muscle Tone: within normal limits Gait & Station: normal Patient leans:  Psychiatric Specialty Exam: ROS  There were no vitals taken for this visit.There is no height or weight on file to calculate BMI.  General Appearance: Meticulous  Eye Contact::  Good  Speech:  Clear and Coherent  Volume:  Normal  Mood:  Euthymic  Affect:  Appropriate  Thought Process:  Coherent  Orientation:  Full (Time, Place, and Person)  Thought Content:  WDL  Suicidal Thoughts:  No  Homicidal Thoughts:  No  Memory:  NA  Judgement:  NA  Insight:  Good  Psychomotor Activity:  Normal  Concentration:  Good  Recall:  Good  Fund of  Knowledge:Good  Language: Good  Akathisia:  No  Handed:  Right  AIMS (if indicated):     Assets:  Communication Skills  ADL's:  Intact  Cognition: WNL  Sleep:      Treatment Plan Summary: 12/10/2019, 2:41 PM  This patient's first problem is that of paranoid schizophrenia  in remission.  He will continue taking Mellaril 25 mg at night.  His second problem is that of adjustment disorder with an anxious mood state.  He will continue taking BuSpar as prescribed.  Patient is very stable.  This patient will return to see me in 5 months.

## 2020-04-28 ENCOUNTER — Telehealth: Payer: Self-pay | Admitting: Cardiovascular Disease

## 2020-04-28 MED ORDER — ATORVASTATIN CALCIUM 20 MG PO TABS: 20.0000 mg | ORAL_TABLET | Freq: Every day | ORAL | 3 refills | Status: DC

## 2020-04-28 MED ORDER — METOPROLOL TARTRATE 50 MG PO TABS: 50.0000 mg | ORAL_TABLET | Freq: Two times a day (BID) | ORAL | 1 refills | Status: DC

## 2020-04-28 MED ORDER — NITROGLYCERIN 0.4 MG SL SUBL: SUBLINGUAL_TABLET | SUBLINGUAL | 3 refills | Status: DC

## 2020-04-28 NOTE — Telephone Encounter (Signed)
*  STAT* If patient is at the pharmacy, call can be transferred to refill team.   1. Which medications need to be refilled? (please list name of each medication and dose if known)  metoprolol tartrate (LOPRESSOR) 50 MG tablet Losartan 25 Mg    atorvastatin (LIPITOR) 20 MG tablet nitroGLYCERIN (NITROSTAT) 0.4 MG SL tablet    2. Which pharmacy/location (including street and city if local pharmacy) is medication to be sent to?  [address], [address] [address], [place], [address] PHONE: [phone] FAX: [phone]  3. Do they need a 30 day or 90 day supply? 90  Patient is calling to update his pharmacy and have his prescriptions to be filled there from now on. Please advise.

## 2020-05-12 ENCOUNTER — Telehealth (INDEPENDENT_AMBULATORY_CARE_PROVIDER_SITE_OTHER): Payer: Medicare HMO | Admitting: Psychiatry

## 2020-05-12 ENCOUNTER — Other Ambulatory Visit: Payer: Self-pay

## 2020-05-12 DIAGNOSIS — F200 Paranoid schizophrenia: Secondary | ICD-10-CM

## 2020-05-12 MED ORDER — THIORIDAZINE HCL 25 MG PO TABS
ORAL_TABLET | ORAL | 7 refills | Status: DC
Start: 2020-05-12 — End: 2020-10-06

## 2020-05-12 MED ORDER — BUSPIRONE HCL 5 MG PO TABS: 5.0000 mg | ORAL_TABLET | Freq: Two times a day (BID) | ORAL | 7 refills | Status: DC

## 2020-05-12 NOTE — Progress Notes (Signed)
Patient ID: Gilbert Leber., male   DOB: 15-Jul-1945, 75 y.o.   MRN: [id number] [facility] MD Progress Note  05/12/2020 2:31 PM Gilbert Leber.  MRN:  [id number] Subjective: Doing well Principal Problem: Schizophrenia residual Diagreat.  Schizophrenia in remission Today the patient is doing well.  He is clearly has baseline.  He is doing a lot of heart.  He still comfortable living in [facility] house.  He likes his environment a great deal.  He has a number of close friends.  Patient is sleeping and eating well.  He has no evidence of psychosis.  He shows few negative symptoms.  He shows no positive symptoms.  His mood is good.  Patient drinks no alcohol uses no drugs.  He takes his medicines just as prescribed.  Medically he is very stable.  Financially he is unconcerned.  He is happy with life.  Patient has a lot of energy he can think and concentrate without problems.  Is a good sense of worth and value as a person.  He is not suicidal now and never has been.  He is never shown any evidence of tardive dyskinesia.    Past Surgical History:  Procedure Laterality Date  . CARDIAC CATHETERIZATION     EF 50%--severe two vessel obstructive atherosclerotic coronary artery disease--Low normal left ventricle funcrtion  . CORONARY ARTERY BYPASS GRAFT  2007  . HERNIA REPAIR    . TONSILLECTOMY AND ADENOIDECTOMY Bilateral y-59   Family History:  Family History  Problem Relation Age of Onset  . Heart disease Mother   . Heart attack Mother   . Heart disease Father   . Heart attack Father   . Heart disease Maternal Grandmother   . Stroke Maternal Grandfather   . Heart failure Maternal Aunt    Family Psychiatric  History:  Social History:  Social History   Substance and Sexual Activity  Alcohol Use No  . Alcohol/week: 0.0 standard drinks     Social History   Substance and Sexual Activity  Drug Use No    Social History   Socioeconomic History  . Marital status: Single    Spouse name: Not on  file  . Number of children: Not on file  . Years of education: Not on file  . Highest education level: Not on file  Occupational History  . Not on file  Tobacco Use  . Smoking status: Former Smoker    Types: Cigarettes    Quit date: 02/03/2006    Years since quitting: 14.2  . Smokeless tobacco: Never Used  Vaping Use  . Vaping Use: Never used  Substance and Sexual Activity  . Alcohol use: No    Alcohol/week: 0.0 standard drinks  . Drug use: No  . Sexual activity: Not Currently  Other Topics Concern  . Not on file  Social History Narrative  . Not on file   Social Determinants of Health   Financial Resource Strain: Not on file  Food Insecurity: Not on file  Transportation Needs: Not on file  Physical Activity: Not on file  Stress: Not on file  Social Connections: Not on file   Additional Social History:                         Sleep: Good  Appetite:  Good  Current Medications: Current Outpatient Medications  Medication Sig Dispense Refill  . aspirin (ASPIRIN EC) 81 MG EC tablet Take 81 mg by mouth daily. Swallow  whole.    . atorvastatin (LIPITOR) 20 MG tablet Take 1 tablet (20 mg total) by mouth daily. 90 tablet 3  . busPIRone (BUSPAR) 5 MG tablet Take 1 tablet (5 mg total) by mouth 2 (two) times daily. 60 tablet 7  . Cholecalciferol (VITAMIN D-3) 5000 units TABS Take 5,000 Units by mouth daily.    . hydrochlorothiazide (HYDRODIURIL) 25 MG tablet TAKE 1 TABLET BY MOUTH ONCE DAILY 90 tablet 3  . hydroxypropyl methylcellulose / hypromellose (ISOPTO TEARS / GONIOVISC) 2.5 % ophthalmic solution Place 1 drop into both eyes 4 (four) times daily as needed for dry eyes.    . metoprolol tartrate (LOPRESSOR) 50 MG tablet Take 1 tablet (50 mg total) by mouth 2 (two) times daily. 180 tablet 1  . Multiple Vitamin (MULTIVITAMIN) tablet Take 1 tablet by mouth daily.      . nitroGLYCERIN (NITROSTAT) 0.4 MG SL tablet DISSOLVE 1 TABLET UNDER THE TONGUE EVERY 5 MINUTES AS   NEEDED FOR CHEST PAIN UP TO 3 TABLETS IN 15 MINUTES,  CALL 911 IF PAIN PERSISTS 100 tablet 3  . thioridazine (MELLARIL) 25 MG tablet 1  qhs 30 tablet 7   No current facility-administered medications for this visit.    Lab Results: No results found for this or any previous visit (from the past 48 hour(s)).  Physical Findings: AIMS:  , ,  ,  ,    CIWA:    COWS:     Musculoskeletal: Strength & Muscle Tone: within normal limits Gait & Station: normal Patient leans:  Psychiatric Specialty Exam: ROS  There were no vitals taken for this visit.There is no height or weight on file to calculate BMI.  General Appearance: Meticulous  Eye Contact::  Good  Speech:  Clear and Coherent  Volume:  Normal  Mood:  Euthymic  Affect:  Appropriate  Thought Process:  Coherent  Orientation:  Full (Time, Place, and Person)  Thought Content:  WDL  Suicidal Thoughts:  No  Homicidal Thoughts:  No  Memory:  NA  Judgement:  NA  Insight:  Good  Psychomotor Activity:  Normal  Concentration:  Good  Recall:  Good  Fund of Knowledge:Good  Language: Good  Akathisia:  No  Handed:  Right  AIMS (if indicated):     Assets:  Communication Skills  ADL's:  Intact  Cognition: WNL  Sleep:      Treatment Plan Summary: 05/12/2020, 2:31 PM  This patient has 2 problems.  His first is a history of paranoid schizophrenia.  He takes 25 mg of Mellaril and is asymptomatic.  His second problem is that of generalized anxiety disorder.  He has been taking BuSpar 5 mg twice daily for many years.  His anxiety is well controlled.  His thoughts are clear organized and connected.  This patient she will return to see me hopefully in person in 5 months.  It is noted the patient has had his vaccines and has not experienced any viral symptomatology.

## 2020-06-29 ENCOUNTER — Other Ambulatory Visit: Payer: Self-pay

## 2020-06-29 ENCOUNTER — Ambulatory Visit (INDEPENDENT_AMBULATORY_CARE_PROVIDER_SITE_OTHER): Payer: Medicare HMO | Admitting: Cardiovascular Disease

## 2020-06-29 ENCOUNTER — Encounter: Payer: Self-pay | Admitting: Cardiovascular Disease

## 2020-06-29 VITALS — BP 144/98 | HR 73 | Ht 72.0 in | Wt 207.4 lb

## 2020-06-29 DIAGNOSIS — N182 Chronic kidney disease, stage 2 (mild): Secondary | ICD-10-CM

## 2020-06-29 DIAGNOSIS — I6523 Occlusion and stenosis of bilateral carotid arteries: Secondary | ICD-10-CM | POA: Diagnosis not present

## 2020-06-29 DIAGNOSIS — I2510 Atherosclerotic heart disease of native coronary artery without angina pectoris: Secondary | ICD-10-CM

## 2020-06-29 DIAGNOSIS — I10 Essential (primary) hypertension: Secondary | ICD-10-CM | POA: Diagnosis not present

## 2020-06-29 DIAGNOSIS — E7800 Pure hypercholesterolemia, unspecified: Secondary | ICD-10-CM | POA: Diagnosis not present

## 2020-06-29 LAB — COMPREHENSIVE METABOLIC PANEL
ALT: 38 IU/L (ref 0–44)
AST: 29 IU/L (ref 0–40)
Albumin/Globulin Ratio: 1.7 (ref 1.2–2.2)
Albumin: 4.4 g/dL (ref 3.7–4.7)
Alkaline Phosphatase: 71 IU/L (ref 44–121)
BUN/Creatinine Ratio: 8 — ABNORMAL LOW (ref 10–24)
BUN: 11 mg/dL (ref 8–27)
Bilirubin Total: 0.7 mg/dL (ref 0.0–1.2)
CO2: 26 mmol/L (ref 20–29)
Calcium: 9.6 mg/dL (ref 8.6–10.2)
Chloride: 101 mmol/L (ref 96–106)
Creatinine, Ser: 1.36 mg/dL — ABNORMAL HIGH (ref 0.76–1.27)
Globulin, Total: 2.6 g/dL (ref 1.5–4.5)
Glucose: 95 mg/dL (ref 65–99)
Potassium: 4.5 mmol/L (ref 3.5–5.2)
Sodium: 139 mmol/L (ref 134–144)
Total Protein: 7 g/dL (ref 6.0–8.5)
eGFR: 55 mL/min/{1.73_m2} — ABNORMAL LOW (ref >59)

## 2020-06-29 LAB — LIPID PANEL
Chol/HDL Ratio: 2.3 ratio (ref 0.0–5.0)
Cholesterol, Total: 142 mg/dL (ref 100–199)
HDL: 62 mg/dL (ref >39)
LDL Chol Calc (NIH): 67 mg/dL (ref 0–99)
Triglycerides: 62 mg/dL (ref 0–149)
VLDL Cholesterol Cal: 13 mg/dL (ref 5–40)

## 2020-06-29 MED ORDER — AMLODIPINE BESYLATE 5 MG PO TABS: 5.0000 mg | ORAL_TABLET | Freq: Every day | ORAL | 3 refills | Status: DC

## 2020-06-29 NOTE — Assessment & Plan Note (Signed)
Blood pressure poorly controlled both initially and on repeat.  Add amlodipine 5 mg daily.  Continue hydrochlorothiazide 25 mg daily.  Check blood pressures and follow-up with pharmacist in 4 to 6 weeks.  Blood pressure goal less than 130/80.

## 2020-06-29 NOTE — Assessment & Plan Note (Signed)
Stable.  Mild disease on carotid Dopplers in 2017 and near normal 06/2019.  Continue lipid management as above.

## 2020-06-29 NOTE — Progress Notes (Signed)
Cardiology Office Note   Date:  06/29/2020   ID:  Gilbert Harris., DOB 03-Nov-1945, MRN [id number]  PCP:  Salli Real, MD  Cardiologist:   Chilton Si, MD   No chief complaint on file.    History of Present Illness: Gilbert Amis. is a 75 y.o. male with hypertension, hyperlipidemia, CAD s/p MI and CABG in 2007, mild carotid stenosis, syncope, and schizophrenia who presents for follow up. At his last appointment his blood pressure was minimal elevated so he was encouraged to start back exercising.   He is doing well since the last visit. He has been walking around the building, but no formal exercise. He plans to join some virtual exercise classes hosted at this building soon. He has no pain in his legs when walking. He has no exertional shortness of breath, chest pain, pressure, or tightness. He recently started Losartan medication to manage his blood pressure, he is tolerating well. However he did recently run out of this medications and request for a refill. He has no LE edema, PND, or orthopnea.  Past Medical History:  Diagnosis Date  . Carotid stenosis 01/05/2016  . Coronary artery disease    CABG x4 LIMA to LAD, SV graft to RCA, sequential saphenous vein graft to diagnal and OM1  . HTN (hypertension)   . Hyperlipidemia   . MI (myocardial infarction) (HCC)   . Schizophrenia [facility])     Past Surgical History:  Procedure Laterality Date  . CARDIAC CATHETERIZATION     EF 50%--severe two vessel obstructive atherosclerotic coronary artery disease--Low normal left ventricle funcrtion  . CORONARY ARTERY BYPASS GRAFT  2007  . HERNIA REPAIR    . TONSILLECTOMY AND ADENOIDECTOMY Bilateral y-59     Current Outpatient Medications  Medication Sig Dispense Refill  . amLODipine (NORVASC) 5 MG tablet Take 1 tablet (5 mg total) by mouth daily. 90 tablet 3  . aspirin 81 MG EC tablet Take 81 mg by mouth daily. Swallow whole.    Marland Kitchen atorvastatin (LIPITOR) 20 MG tablet Take 1 tablet (20  mg total) by mouth daily. 90 tablet 3  . busPIRone (BUSPAR) 5 MG tablet Take 1 tablet (5 mg total) by mouth 2 (two) times daily. 60 tablet 7  . Cholecalciferol (VITAMIN D-3) 5000 units TABS Take 5,000 Units by mouth daily.    . hydrochlorothiazide (HYDRODIURIL) 25 MG tablet TAKE 1 TABLET BY MOUTH ONCE DAILY 90 tablet 3  . hydroxypropyl methylcellulose / hypromellose (ISOPTO TEARS / GONIOVISC) 2.5 % ophthalmic solution Place 1 drop into both eyes 4 (four) times daily as needed for dry eyes.    . metoprolol tartrate (LOPRESSOR) 50 MG tablet Take 1 tablet (50 mg total) by mouth 2 (two) times daily. 180 tablet 1  . Multiple Vitamin (MULTIVITAMIN) tablet Take 1 tablet by mouth daily.      . nitroGLYCERIN (NITROSTAT) 0.4 MG SL tablet DISSOLVE 1 TABLET UNDER THE TONGUE EVERY 5 MINUTES AS  NEEDED FOR CHEST PAIN UP TO 3 TABLETS IN 15 MINUTES,  CALL 911 IF PAIN PERSISTS 100 tablet 3  . thioridazine (MELLARIL) 25 MG tablet 1  qhs 30 tablet 7   No current facility-administered medications for this visit.    Allergies:   Haloperidol decanoate    Social History:  The patient  reports that he quit smoking about 14 years ago. His smoking use included cigarettes. He has never used smokeless tobacco. He reports that he does not drink alcohol and does not  use drugs.   Family History:  The patient's family history includes Heart attack in his father and mother; Heart disease in his father, maternal grandmother, and mother; Heart failure in his maternal aunt; Stroke in his maternal grandfather.    ROS:  Please see the history of present illness. Otherwise, review of systems are positive for none.   All other systems are reviewed and negative.    PHYSICAL EXAM: VS:  BP (!) 144/98   Pulse 73   Ht 6' (1.829 m)   Wt 207 lb 6.4 oz (94.1 kg)   SpO2 96%   BMI 28.13 kg/m  , BMI Body mass index is 28.13 kg/m. GENERAL:  Well appearing HEENT: Pupils equal round and reactive, fundi not visualized, oral mucosa  unremarkable NECK:  No jugular venous distention, waveform within normal limits, carotid upstroke brisk and symmetric, no bruits LUNGS:  Clear to auscultation bilaterally HEART:  RRR.  PMI not displaced or sustained,S1 and S2 within normal limits, no S3, no S4, no clicks, no rubs, no murmurs ABD:  Flat, positive bowel sounds normal in frequency in pitch, no bruits, no rebound, no guarding, no midline pulsatile mass, no hepatomegaly, no splenomegaly EXT:  2 plus pulses throughout, no edema, no cyanosis no clubbing SKIN:  No rashes no nodules NEURO:  Cranial nerves II through XII grossly intact, motor grossly intact throughout PSYCH:  Cognitively intact, oriented to person place and time    EKG:   06/29/20-Sinus rhythm with 1 PACs. Rate 72 bpm The ekg ordered 01/05/16 demonstrates sinus rhythm. Rate 64 bpm. nonspecific T wave abnormalities. 07/03/16: Sinus rhythm.  Rate 74 bpm.  Lateral T wave abnormalities 05/14/17: Sinus rhythm.  Rate 69 bpm. 11/29/17: Sinus rhythm.  Rate 63 bpm.  Nonspecific T wave abnormalities 06/04/18: Sinus rhythm.  Rate 64 bpm.  Nonspecific T wave abnormalities.  07/04/2019: Sinus rhythm.  Rate 72 bpm. Nonspecific T wave abnormalities.  Recent Labs: 07/04/2019: ALT 32; BUN 11; Creatinine, Ser 1.34; Potassium 4.2; Sodium 141   Carotid Doppler 07/2015: 1-39% stenosis bilaterally   Lipid Panel    Component Value Date/Time   CHOL 145 07/04/2019 1151   TRIG 83 07/04/2019 1151   HDL 56 07/04/2019 1151   CHOLHDL 2.6 07/04/2019 1151   CHOLHDL 2.3 04/21/2016 1032   VLDL 17 04/21/2016 1032   LDLCALC 73 07/04/2019 1151      Wt Readings from Last 3 Encounters:  06/29/20 207 lb 6.4 oz (94.1 kg)  07/04/19 205 lb 9.6 oz (93.3 kg)  06/04/18 201 lb (91.2 kg)     ASSESSMENT AND PLAN: HYPERCHOLESTEROLEMIA Check fasting lipids and CMP today.  LDL goal is less than 70.  Continue atorvastatin.  Essential hypertension Blood pressure poorly controlled both initially and on  repeat.  Add amlodipine 5 mg daily.  Continue hydrochlorothiazide 25 mg daily.  Check blood pressures and follow-up with pharmacist in 4 to 6 weeks.  Blood pressure goal less than 130/80.  Coronary atherosclerosis Status post CABG.  Doing well clinically.  Encouraged to continue exercising.  Check fasting lipids and CMP as above.  Continue aspirin, atorvastatin, and metoprolol.  Carotid stenosis Stable.  Mild disease on carotid Dopplers in 2017 and near normal 06/2019.  Continue lipid management as above.     Current medicines are reviewed at length with the patient today.  The patient does not have concerns regarding medicines.  The following changes have been made:  no change  Labs/ tests ordered today include:   Orders Placed This  Encounter  Procedures  . Lipid panel  . Comprehensive metabolic panel  . AMB Referral to [facility] Pharm-D  . EKG 12-Lead     Disposition:   FU with Tamieka Rancourt C. Duke Salvia, MD, [facility] in 1 year.  PharmD in 4-6 weeks.   I,Alexis Bryant,acting as a Neurosurgeon for [employer], MD.,have documented all relevant documentation on the behalf of Chilton Si, MD,as directed by  Chilton Si, MD while in the presence of Chilton Si, MD.  Signed, Ennio Houp C. Duke Salvia, MD, [facility]  06/29/2020 1:06 PM    [facility]

## 2020-06-29 NOTE — Assessment & Plan Note (Signed)
Status post CABG.  Doing well clinically.  Encouraged to continue exercising.  Check fasting lipids and CMP as above.  Continue aspirin, atorvastatin, and metoprolol.

## 2020-06-29 NOTE — Patient Instructions (Signed)
Medication Instructions:  START AMLODIPINE 5 MG DAILY   *If you need a refill on your cardiac medications before your next appointment, please call your pharmacy*   Lab Work: LP/CMET TODAY   If you have labs (blood work) drawn today and your tests are completely normal, you will receive your results only by: Marland Kitchen MyChart Message (if you have MyChart) OR . A paper copy in the mail If you have any lab test that is abnormal or we need to change your treatment, we will call you to review the results.   Testing/Procedures: NONE   Follow-Up: At [facility], you and your health needs are our priority.  As part of our continuing mission to provide you with exceptional heart care, we have created designated Provider Care Teams.  These Care Teams include your primary Cardiologist (physician) and Advanced Practice Providers (APPs -  Physician Assistants and Nurse Practitioners) who all work together to provide you with the care you need, when you need it.  We recommend signing up for the patient portal called "MyChart".  Sign up information is provided on this After Visit Summary.  MyChart is used to connect with patients for Virtual Visits (Telemedicine).  Patients are able to view lab/test results, encounter notes, upcoming appointments, etc.  Non-urgent messages can be sent to your provider as well.   To learn more about what you can do with MyChart, go to [email or web address].    Your next appointment:   12 month(s)  The format for your next appointment:   In Person  Provider:   DR [facility] OR PA/NP AT [facility] LOCATION   Your physician recommends that you schedule a follow-up appointment in: PHARM D 4-6 WEEKS

## 2020-06-29 NOTE — Assessment & Plan Note (Signed)
Check fasting lipids and CMP today.  LDL goal is less than 70.  Continue atorvastatin.

## 2020-07-27 ENCOUNTER — Other Ambulatory Visit: Payer: Self-pay

## 2020-07-27 ENCOUNTER — Ambulatory Visit (INDEPENDENT_AMBULATORY_CARE_PROVIDER_SITE_OTHER): Payer: Medicare HMO | Admitting: Pharmacist

## 2020-07-27 VITALS — BP 134/92 | HR 70 | Resp 17 | Ht 72.0 in | Wt 207.8 lb

## 2020-07-27 DIAGNOSIS — I10 Essential (primary) hypertension: Secondary | ICD-10-CM

## 2020-07-27 MED ORDER — AMLODIPINE BESYLATE 5 MG PO TABS: 5.0000 mg | ORAL_TABLET | Freq: Two times a day (BID) | ORAL | 0 refills | Status: DC

## 2020-07-27 MED ORDER — ATORVASTATIN CALCIUM 20 MG PO TABS: 20.0000 mg | ORAL_TABLET | Freq: Every day | ORAL | 3 refills | Status: DC

## 2020-07-27 NOTE — Progress Notes (Signed)
Patient ID: Gilbert Harris.                 DOB: [birth year]                      MRN: [id number]     HPI: Gilbert Harris. is a 75 y.o. male referred by Dr. Sharyne Richters to HTN clinic. PMH includes uncontrolled hypertension, hyperlipidemia, CAD s/p MI and CABG in 2007, carotid stenosis, hx of syncope, and schizophrenia.   Patient presents today in good spirit. Reports compliance with medication and low sodium diet. Denied dizziness , swelling, or lightheadedness. He usually paints or draw to relieve stress.    Current HTN meds:  Amlodipine 5mg  daily - added on 06/29/2020 per Dr 08/29/2020 HCTZ 25mg  daily Metoprolol tartrate 50mg  BID  Previously tried:   BP goal: 130/80  Family History: Heart attack in his father and mother; Heart disease in his father, maternal grandmother, and mother; Heart failure in his maternal aunt; Stroke in his maternal grandfather. HTN in mother, father,and siblings.  Social History: denies alcohol or tobacco - stopped > 14 years ago  Diet: mainly cooks, occasional take out, works on low sodium diet, eating more fruits and vegetables.  Exercise: walkings  Home BP readings: none provided  Wt Readings from Last 3 Encounters:  07/27/20 207 lb 12.8 oz (94.3 kg)  06/29/20 207 lb 6.4 oz (94.1 kg)  07/04/19 205 lb 9.6 oz (93.3 kg)   BP Readings from Last 3 Encounters:  07/27/20 (!) 134/92  06/29/20 (!) 144/98  07/04/19 130/84   Pulse Readings from Last 3 Encounters:  07/27/20 70  06/29/20 73  07/04/19 72    Past Medical History:  Diagnosis Date  . Carotid stenosis 01/05/2016  . Coronary artery disease    CABG x4 LIMA to LAD, SV graft to RCA, sequential saphenous vein graft to diagnal and OM1  . HTN (hypertension)   . Hyperlipidemia   . MI (myocardial infarction) (HCC)   . Schizophrenia [facility])     Current Outpatient Medications on File Prior to Visit  Medication Sig Dispense Refill  . aspirin 81 MG EC tablet Take 81 mg by mouth daily. Swallow whole.     . busPIRone (BUSPAR) 5 MG tablet Take 1 tablet (5 mg total) by mouth 2 (two) times daily. 60 tablet 7  . Cholecalciferol (VITAMIN D-3) 5000 units TABS Take 5,000 Units by mouth daily.    . hydrochlorothiazide (HYDRODIURIL) 25 MG tablet TAKE 1 TABLET BY MOUTH ONCE DAILY 90 tablet 3  . hydroxypropyl methylcellulose / hypromellose (ISOPTO TEARS / GONIOVISC) 2.5 % ophthalmic solution Place 1 drop into both eyes 4 (four) times daily as needed for dry eyes.    . metoprolol tartrate (LOPRESSOR) 50 MG tablet Take 1 tablet (50 mg total) by mouth 2 (two) times daily. 180 tablet 1  . Multiple Vitamin (MULTIVITAMIN) tablet Take 1 tablet by mouth daily.      . nitroGLYCERIN (NITROSTAT) 0.4 MG SL tablet DISSOLVE 1 TABLET UNDER THE TONGUE EVERY 5 MINUTES AS  NEEDED FOR CHEST PAIN UP TO 3 TABLETS IN 15 MINUTES,  CALL 911 IF PAIN PERSISTS 100 tablet 3  . thioridazine (MELLARIL) 25 MG tablet 1  qhs 30 tablet 7   No current facility-administered medications on file prior to visit.    Allergies  Allergen Reactions  . Haloperidol Decanoate Shortness Of Breath    Blood pressure (!) 134/92, pulse 70, resp. rate 17, height 6' (  1.829 m), weight 207 lb 12.8 oz (94.3 kg), SpO2 95 %.  Essential hypertension Blood pressure improved but remains above desired goal of 130/80. Patient reports compliance with medication and positive lifestyle modifications.   Patient has transportation issues and adding ABR or spironolactone to therapy, will increase need for clinic visit and blood work monitoring.  Will titrate amlodipine dose for now, and consider adding valsartan or irbesartan to his regimen during next OV, if additional BP control needed.    Racquel Arkin Rodriguez-Guzman PharmD, BCPS, CPP [facility] [address] [place] [address] 07/28/2020 4:52 PM

## 2020-07-27 NOTE — Patient Instructions (Addendum)
Return for a  follow up appointment in 6 weeks  Check your blood pressure at home daily (if able) and keep record of the readings.  Take your BP meds as follows: INCREASE AMLODIPINE DOSE TO 5MG  TWICE DAILY  Bring all of your meds, your BP cuff and your record of home blood pressures to your next appointment.  Exercise as you're able, try to walk approximately 30 minutes per day.  Keep salt intake to a minimum, especially watch canned and prepared boxed foods.  Eat more fresh fruits and vegetables and fewer canned items.  Avoid eating in fast food restaurants.    HOW TO TAKE YOUR BLOOD PRESSURE: . Rest 5 minutes before taking your blood pressure. .  Don't smoke or drink caffeinated beverages for at least 30 minutes before. . Take your blood pressure before (not after) you eat. . Sit comfortably with your back supported and both feet on the floor (don't cross your legs). . Elevate your arm to heart level on a table or a desk. . Use the proper sized cuff. It should fit smoothly and snugly around your bare upper arm. There should be enough room to slip a fingertip under the cuff. The bottom edge of the cuff should be 1 inch above the crease of the elbow. . Ideally, take 3 measurements at one sitting and record the average.

## 2020-07-28 ENCOUNTER — Encounter: Payer: Self-pay | Admitting: Pharmacist

## 2020-07-28 NOTE — Assessment & Plan Note (Signed)
Blood pressure improved but remains above desired goal of 130/80. Patient reports compliance with medication and positive lifestyle modifications.   Patient has transportation issues and adding ABR or spironolactone to therapy, will increase need for clinic visit and blood work monitoring.  Will titrate amlodipine dose for now, and consider adding valsartan or irbesartan to his regimen during next OV, if additional BP control needed.

## 2020-09-07 ENCOUNTER — Other Ambulatory Visit: Payer: Self-pay

## 2020-09-07 ENCOUNTER — Ambulatory Visit (INDEPENDENT_AMBULATORY_CARE_PROVIDER_SITE_OTHER): Payer: Medicare HMO | Admitting: Pharmacist Clinician (PhC)/ Clinical Pharmacy Specialist

## 2020-09-07 VITALS — BP 146/94 | HR 68 | Resp 17 | Ht 72.0 in | Wt 207.4 lb

## 2020-09-07 DIAGNOSIS — I10 Essential (primary) hypertension: Secondary | ICD-10-CM | POA: Diagnosis not present

## 2020-09-07 MED ORDER — CHLORTHALIDONE 25 MG PO TABS: 25.0000 mg | ORAL_TABLET | Freq: Every day | ORAL | 3 refills | Status: DC

## 2020-09-07 NOTE — Patient Instructions (Signed)
Return for a a follow up appointment July 26 at 9:30 am  Go to the lab in 2 weeks - week of June 27th.  Does not need to be fasting  Check your blood pressure at home 2-3 days each weekand keep record of the readings.  Take your BP meds as follows:  STOP HYDROCHLOROTHIAZIDE  START CHLORTHALIDONE 25 MG ONCE DAILY IN THE MORNINGS  Bring all of your meds, your BP cuff and your record of home blood pressures to your next appointment.  Exercise as you're able, try to walk approximately 30 minutes per day.  Keep salt intake to a minimum, especially watch canned and prepared boxed foods.  Eat more fresh fruits and vegetables and fewer canned items.  Avoid eating in fast food restaurants.    HOW TO TAKE YOUR BLOOD PRESSURE: Rest 5 minutes before taking your blood pressure.  Don't smoke or drink caffeinated beverages for at least 30 minutes before. Take your blood pressure before (not after) you eat. Sit comfortably with your back supported and both feet on the floor (don't cross your legs). Elevate your arm to heart level on a table or a desk. Use the proper sized cuff. It should fit smoothly and snugly around your bare upper arm. There should be enough room to slip a fingertip under the cuff. The bottom edge of the cuff should be 1 inch above the crease of the elbow. Ideally, take 3 measurements at one sitting and record the average.

## 2020-09-07 NOTE — Progress Notes (Signed)
Patient ID: Gilbert Harris.                 DOB: 01-23-1946                      MRN: [id number]     HPI: Gilbert Harris. is a 74 y.o. male referred by Dr. Sharyne Richters to HTN clinic. PMH includes uncontrolled hypertension, hyperlipidemia, CAD s/p MI and CABG in 2007, carotid stenosis, hx of syncope, and schizophrenia.   When he was seen originally in April his pressure was 144/98 and amlodipine 5 mg was added.  A month later he was seen in [facility] and there was some improvement, down to 134/92.  The amlodipine was increased to 10 mg daily (5 mg bid).  Because of transportation issues, have been avoiding adding ACEI/ARB, as for those, he would need to return in 2 weeks for labs.    Today he returns for follow up.  In speaking with him he notes that he only takes amlodipine and metoprolol for his blood pressure, atorvastatin and thioridazine.  Not sure if he is using buspirone, so will leave on his profile for now.  He doesn't recognize hctz and doesn't think he's taking it.  He has no complaints today, is feeling well overall and is now doing a video exercise program twice weekly.  States compliance with his medication.    Current HTN meds:  Amlodipine 5mg  bid HCTZ 25mg  daily Metoprolol tartrate 50mg  BID  Previously tried:   BP goal: 130/80  Family History: Heart attack in his father and mother; Heart disease in his father, maternal grandmother, and mother; Heart failure in his maternal aunt; Stroke in his maternal grandfather. HTN in mother, father,and siblings.  Social History: denies alcohol or tobacco - stopped > 14 years ago  Diet: mainly cooks at home; has ben told he is pre-diabetic so has moved his dietary focus to that and thinks he may be eating more sodium.    Exercise: walking around the block and pacing within apartment; exercise twice weekly with video - arm and leg conditioning  Home BP readings: none provided  Labs:  4/22:  Na 139, K 4.5, Glu 95, BUN 11, SCr 1.36, GFR 55  Wt  Readings from Last 3 Encounters:  09/07/20 207 lb 6.4 oz (94.1 kg)  07/27/20 207 lb 12.8 oz (94.3 kg)  06/29/20 207 lb 6.4 oz (94.1 kg)   BP Readings from Last 3 Encounters:  09/07/20 (!) 146/94  07/27/20 (!) 134/92  06/29/20 (!) 144/98   Pulse Readings from Last 3 Encounters:  09/07/20 68  07/27/20 70  06/29/20 73    Past Medical History:  Diagnosis Date   Carotid stenosis 01/05/2016   Coronary artery disease    CABG x4 LIMA to LAD, SV graft to RCA, sequential saphenous vein graft to diagnal and OM1   HTN (hypertension)    Hyperlipidemia    MI (myocardial infarction) (HCC)    Schizophrenia (HCC)     Current Outpatient Medications on File Prior to Visit  Medication Sig Dispense Refill   amLODipine (NORVASC) 5 MG tablet Take 1 tablet (5 mg total) by mouth in the morning and at bedtime. 180 tablet 0   aspirin 81 MG EC tablet Take 81 mg by mouth daily. Swallow whole.     atorvastatin (LIPITOR) 20 MG tablet Take 1 tablet (20 mg total) by mouth daily. 90 tablet 3   busPIRone (BUSPAR) 5 MG tablet Take  1 tablet (5 mg total) by mouth 2 (two) times daily. 60 tablet 7   Cholecalciferol (VITAMIN D-3) 5000 units TABS Take 5,000 Units by mouth daily.     hydroxypropyl methylcellulose / hypromellose (ISOPTO TEARS / GONIOVISC) 2.5 % ophthalmic solution Place 1 drop into both eyes 4 (four) times daily as needed for dry eyes.     metoprolol tartrate (LOPRESSOR) 50 MG tablet Take 1 tablet (50 mg total) by mouth 2 (two) times daily. 180 tablet 1   Multiple Vitamin (MULTIVITAMIN) tablet Take 1 tablet by mouth daily.       nitroGLYCERIN (NITROSTAT) 0.4 MG SL tablet DISSOLVE 1 TABLET UNDER THE TONGUE EVERY 5 MINUTES AS  NEEDED FOR CHEST PAIN UP TO 3 TABLETS IN 15 MINUTES,  CALL 911 IF PAIN PERSISTS 100 tablet 3   thioridazine (MELLARIL) 25 MG tablet 1  qhs 30 tablet 7   No current facility-administered medications on file prior to visit.    Allergies  Allergen Reactions   Haloperidol Decanoate  Shortness Of Breath   Haloperidol Lactate Other (See Comments)    Blood pressure (!) 146/94, pulse 68, resp. rate 17, height 6' (1.829 m), weight 207 lb 6.4 oz (94.1 kg), SpO2 96 %.  Essential hypertension Patient with essential hypertension, but more of a diastolic issue.  This has consistently been > 90, while the systolic bounces around more.  He does not believe he is taking hctz, so will d/c that and have him start chlorthalidone for better 24 hour diastolic control.  He will double check his meds on the hctz to be sure it's not there, then start the chlorthalidone tomorrow.  He will come back in 2 weeks to get metabolic panel, then 6 weeks for follow up.  We discussed how to minimize sodium in canned vegetables and breads/lunch meats and he will focus on trying to incorporate that in addition to the changes he made for the pre-diabetes.    Phillips Hay PharmD CPP [facility] [address] [place] [address] 09/07/2020 9:48 AM

## 2020-09-07 NOTE — Assessment & Plan Note (Signed)
Patient with essential hypertension, but more of a diastolic issue.  This has consistently been > 90, while the systolic bounces around more.  He does not believe he is taking hctz, so will d/c that and have him start chlorthalidone for better 24 hour diastolic control.  He will double check his meds on the hctz to be sure it's not there, then start the chlorthalidone tomorrow.  He will come back in 2 weeks to get metabolic panel, then 6 weeks for follow up.  We discussed how to minimize sodium in canned vegetables and breads/lunch meats and he will focus on trying to incorporate that in addition to the changes he made for the pre-diabetes.

## 2020-09-13 ENCOUNTER — Other Ambulatory Visit: Payer: Self-pay

## 2020-09-13 MED ORDER — AMLODIPINE BESYLATE 5 MG PO TABS
5.0000 mg | ORAL_TABLET | Freq: Two times a day (BID) | ORAL | 3 refills | Status: DC
Start: 2020-09-13 — End: 2020-11-11

## 2020-09-13 MED ORDER — ATORVASTATIN CALCIUM 20 MG PO TABS: 20.0000 mg | ORAL_TABLET | Freq: Every day | ORAL | 3 refills | Status: DC

## 2020-09-13 MED ORDER — METOPROLOL TARTRATE 50 MG PO TABS: 50.0000 mg | ORAL_TABLET | Freq: Two times a day (BID) | ORAL | 3 refills | Status: DC

## 2020-10-06 ENCOUNTER — Other Ambulatory Visit: Payer: Self-pay

## 2020-10-06 ENCOUNTER — Ambulatory Visit (INDEPENDENT_AMBULATORY_CARE_PROVIDER_SITE_OTHER): Payer: Medicare HMO | Admitting: Psychiatry

## 2020-10-06 DIAGNOSIS — F200 Paranoid schizophrenia: Secondary | ICD-10-CM | POA: Diagnosis not present

## 2020-10-06 MED ORDER — BUSPIRONE HCL 5 MG PO TABS: 5.0000 mg | ORAL_TABLET | Freq: Two times a day (BID) | ORAL | 7 refills | Status: DC

## 2020-10-06 MED ORDER — THIORIDAZINE HCL 25 MG PO TABS: ORAL_TABLET | ORAL | 7 refills | Status: DC

## 2020-10-06 NOTE — Progress Notes (Signed)
Patient ID: Gilbert Leber., male   DOB: 06/28/1945, 75 y.o.   MRN: [id number] [facility] MD Progress Note  10/06/2020 1:45 PM Gilbert Leber.  MRN:  [id number] Subjective: Doing well Principal Problem: Schizophrenia residual Diagreat.  Schizophrenia in remission   Today the patient is doing very well.  He is very well-dressed.  He has got reasonably good eye contact.  The patient's mood is great.  His younger son recently got married in [place].  He was challenging for him to be in a social setting and doing social activity.  He has not been dressed often years.  This is clearly a challenging event for him but he did well.  Enjoyed himself.  He saw his ex-wife.  He saw his other son is also married.  There was a great family gathering.  The patient denies daily depression.  He is sleeping and eating well.  He is got good energy.  He has no problems thinking and concentrating.  Continues doing art projects.  The patient is also been exercising more.  They have a new employee at the [employer] he was taking anybody out to recreational centers.  He likes it a great deal.  The patient denies auditory or visual hallucinations.  He denies any paranoia.  Today had an aims test that demonstrated no evidence of tardive dyskinesia.  15 years ago this patient had bypass surgery.  Therefore he is followed closely by his cardiologist.  He recently saw them a few months ago.  He is doing well.  He denies chest pain or shortness of breath.  The patient is well controlled hypertension.  Medically he feels very well.  Financially he is stable.  He likes where he lives a great deal.  He is enjoying life.  He takes his medicines as prescribed.  This includes Mellaril 25 mg and BuSpar 5 mg twice daily.  The patient does not smoke.  Does not drink.   Past Surgical History:  Procedure Laterality Date   CARDIAC CATHETERIZATION     EF 50%--severe two vessel obstructive atherosclerotic coronary artery disease--Low normal left  ventricle funcrtion   CORONARY ARTERY BYPASS GRAFT  2007   HERNIA REPAIR     TONSILLECTOMY AND ADENOIDECTOMY Bilateral y-59   Family History:  Family History  Problem Relation Age of Onset   Heart disease Mother    Heart attack Mother    Heart disease Father    Heart attack Father    Heart disease Maternal Grandmother    Stroke Maternal Grandfather    Heart failure Maternal Aunt    Family Psychiatric  History:  Social History:  Social History   Substance and Sexual Activity  Alcohol Use No   Alcohol/week: 0.0 standard drinks     Social History   Substance and Sexual Activity  Drug Use No    Social History   Socioeconomic History   Marital status: Single    Spouse name: Not on file   Number of children: Not on file   Years of education: Not on file   Highest education level: Not on file  Occupational History   Not on file  Tobacco Use   Smoking status: Former    Pack years: 0.00    Types: Cigarettes    Quit date: 02/03/2006    Years since quitting: 14.6   Smokeless tobacco: Never  Vaping Use   Vaping Use: Never used  Substance and Sexual Activity   Alcohol use: No  Alcohol/week: 0.0 standard drinks   Drug use: No   Sexual activity: Not Currently  Other Topics Concern   Not on file  Social History Narrative   Not on file   Social Determinants of Health   Financial Resource Strain: Not on file  Food Insecurity: Not on file  Transportation Needs: Not on file  Physical Activity: Not on file  Stress: Not on file  Social Connections: Not on file   Additional Social History:                         Sleep: Good  Appetite:  Good  Current Medications: Current Outpatient Medications  Medication Sig Dispense Refill   amLODipine (NORVASC) 5 MG tablet Take 1 tablet (5 mg total) by mouth in the morning and at bedtime. 180 tablet 3   aspirin 81 MG EC tablet Take 81 mg by mouth daily. Swallow whole.     atorvastatin (LIPITOR) 20 MG tablet Take  1 tablet (20 mg total) by mouth daily. 90 tablet 3   busPIRone (BUSPAR) 5 MG tablet Take 1 tablet (5 mg total) by mouth 2 (two) times daily. 60 tablet 7   chlorthalidone (HYGROTON) 25 MG tablet Take 1 tablet (25 mg total) by mouth daily. 30 tablet 3   Cholecalciferol (VITAMIN D-3) 5000 units TABS Take 5,000 Units by mouth daily.     hydroxypropyl methylcellulose / hypromellose (ISOPTO TEARS / GONIOVISC) 2.5 % ophthalmic solution Place 1 drop into both eyes 4 (four) times daily as needed for dry eyes.     metoprolol tartrate (LOPRESSOR) 50 MG tablet Take 1 tablet (50 mg total) by mouth 2 (two) times daily. 180 tablet 3   Multiple Vitamin (MULTIVITAMIN) tablet Take 1 tablet by mouth daily.       nitroGLYCERIN (NITROSTAT) 0.4 MG SL tablet DISSOLVE 1 TABLET UNDER THE TONGUE EVERY 5 MINUTES AS  NEEDED FOR CHEST PAIN UP TO 3 TABLETS IN 15 MINUTES,  CALL 911 IF PAIN PERSISTS 100 tablet 3   thioridazine (MELLARIL) 25 MG tablet 1  qhs 30 tablet 7   No current facility-administered medications for this visit.    Lab Results: No results found for this or any previous visit (from the past 48 hour(s)).  Physical Findings: AIMS:  , ,  ,  ,    CIWA:    COWS:     Musculoskeletal: Strength & Muscle Tone: within normal limits Gait & Station: normal Patient leans:  Psychiatric Specialty Exam: ROS  There were no vitals taken for this visit.There is no height or weight on file to calculate BMI.  General Appearance: Meticulous  Eye Contact::  Good  Speech:  Clear and Coherent  Volume:  Normal  Mood:  Euthymic  Affect:  Appropriate  Thought Process:  Coherent  Orientation:  Full (Time, Place, and Person)  Thought Content:  WDL  Suicidal Thoughts:  No  Homicidal Thoughts:  No  Memory:  NA  Judgement:  NA  Insight:  Good  Psychomotor Activity:  Normal  Concentration:  Good  Recall:  Good  Fund of Knowledge:Good  Language: Good  Akathisia:  No  Handed:  Right  AIMS (if indicated):     Assets:   Communication Skills  ADL's:  Intact  Cognition: WNL  Sleep:      Treatment Plan Summary: 10/06/2020, 1:45 PM   This patient is doing very well.  He has 2 problems.  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  He continues taking Mellaril 25 mg.  He is having no complications from this agent.  His second problem is generalized anxiety disorder.  He takes BuSpar 5 mg twice daily and does well.  He is very stable in terms of his emotions.  He is well dressed and functioning extremely well.  Return to see me in 5 months.

## 2020-10-14 ENCOUNTER — Other Ambulatory Visit: Payer: Self-pay

## 2020-10-14 ENCOUNTER — Emergency Department (HOSPITAL_COMMUNITY): Payer: Medicare HMO

## 2020-10-14 ENCOUNTER — Emergency Department (HOSPITAL_COMMUNITY)
Admission: EM | Admit: 2020-10-14 | Discharge: 2020-10-15 | Disposition: A | Discharge status: Home / Self Care | Payer: Medicare HMO | Attending: Emergency Medicine | Admitting: Emergency Medicine

## 2020-10-14 DIAGNOSIS — Z87891 Personal history of nicotine dependence: Secondary | ICD-10-CM | POA: Diagnosis not present

## 2020-10-14 DIAGNOSIS — B9689 Other specified bacterial agents as the cause of diseases classified elsewhere: Secondary | ICD-10-CM | POA: Diagnosis not present

## 2020-10-14 DIAGNOSIS — Z7982 Long term (current) use of aspirin: Secondary | ICD-10-CM | POA: Diagnosis not present

## 2020-10-14 DIAGNOSIS — I129 Hypertensive chronic kidney disease with stage 1 through stage 4 chronic kidney disease, or unspecified chronic kidney disease: Secondary | ICD-10-CM | POA: Insufficient documentation

## 2020-10-14 DIAGNOSIS — I119 Hypertensive heart disease without heart failure: Secondary | ICD-10-CM | POA: Diagnosis not present

## 2020-10-14 DIAGNOSIS — N452 Orchitis: Secondary | ICD-10-CM | POA: Diagnosis not present

## 2020-10-14 DIAGNOSIS — N182 Chronic kidney disease, stage 2 (mild): Secondary | ICD-10-CM | POA: Insufficient documentation

## 2020-10-14 DIAGNOSIS — I2510 Atherosclerotic heart disease of native coronary artery without angina pectoris: Secondary | ICD-10-CM | POA: Insufficient documentation

## 2020-10-14 DIAGNOSIS — N453 Epididymo-orchitis: Secondary | ICD-10-CM

## 2020-10-14 DIAGNOSIS — Z951 Presence of aortocoronary bypass graft: Secondary | ICD-10-CM | POA: Diagnosis not present

## 2020-10-14 DIAGNOSIS — Z79899 Other long term (current) drug therapy: Secondary | ICD-10-CM | POA: Diagnosis not present

## 2020-10-14 DIAGNOSIS — N5089 Other specified disorders of the male genital organs: Secondary | ICD-10-CM | POA: Diagnosis present

## 2020-10-14 DIAGNOSIS — N451 Epididymitis: Secondary | ICD-10-CM | POA: Diagnosis not present

## 2020-10-14 LAB — URINALYSIS, ROUTINE W REFLEX MICROSCOPIC
Bilirubin Urine: NEGATIVE
Glucose, UA: NEGATIVE mg/dL
Ketones, ur: NEGATIVE mg/dL
Nitrite: NEGATIVE
Protein, ur: 30 mg/dL — AB
Specific Gravity, Urine: 1.018 (ref 1.005–1.030)
WBC, UA: >50 WBC/hpf — ABNORMAL HIGH (ref 0–5)
pH: 5 (ref 5.0–8.0)

## 2020-10-14 LAB — CBC
HCT: 40.3 % (ref 39.0–52.0)
Hemoglobin: 13.8 g/dL (ref 13.0–17.0)
MCH: 31.9 pg (ref 26.0–34.0)
MCHC: 34.2 g/dL (ref 30.0–36.0)
MCV: 93.3 fL (ref 80.0–100.0)
Platelets: 136 10*3/uL — ABNORMAL LOW (ref 150–400)
RBC: 4.32 MIL/uL (ref 4.22–5.81)
RDW: 12.4 % (ref 11.5–15.5)
WBC: 6.9 10*3/uL (ref 4.0–10.5)
nRBC: 0 % (ref 0.0–0.2)

## 2020-10-14 LAB — COMPREHENSIVE METABOLIC PANEL
ALT: 34 U/L (ref 0–44)
AST: 37 U/L (ref 15–41)
Albumin: 3.7 g/dL (ref 3.5–5.0)
Alkaline Phosphatase: 54 U/L (ref 38–126)
Anion gap: 10 (ref 5–15)
BUN: 12 mg/dL (ref 8–23)
CO2: 28 mmol/L (ref 22–32)
Calcium: 8.9 mg/dL (ref 8.9–10.3)
Chloride: 93 mmol/L — ABNORMAL LOW (ref 98–111)
Creatinine, Ser: 1.46 mg/dL — ABNORMAL HIGH (ref 0.61–1.24)
GFR, Estimated: 50 mL/min — ABNORMAL LOW (ref >60)
Glucose, Bld: 123 mg/dL — ABNORMAL HIGH (ref 70–99)
Potassium: 3.4 mmol/L — ABNORMAL LOW (ref 3.5–5.1)
Sodium: 131 mmol/L — ABNORMAL LOW (ref 135–145)
Total Bilirubin: 1.2 mg/dL (ref 0.3–1.2)
Total Protein: 6.8 g/dL (ref 6.5–8.1)

## 2020-10-14 LAB — LIPASE, BLOOD: Lipase: 29 U/L (ref 11–51)

## 2020-10-14 MED ORDER — ACETAMINOPHEN 325 MG PO TABS
650.0000 mg | ORAL_TABLET | Freq: Once | ORAL | Status: AC
  Administered 2020-10-14: 650 mg via ORAL
  Filled 2020-10-14: qty 2

## 2020-10-14 NOTE — ED Triage Notes (Signed)
Left scrotal swelling and pain x 4 days since doing heaving lifting. Denies any dysuria, nausea and vomiting.

## 2020-10-15 MED ORDER — HYDROCODONE-ACETAMINOPHEN 5-325 MG PO TABS: 1.0000 | ORAL_TABLET | Freq: Four times a day (QID) | ORAL | 0 refills | Status: DC | PRN

## 2020-10-15 MED ORDER — LEVOFLOXACIN 500 MG PO TABS
500.0000 mg | ORAL_TABLET | Freq: Once | ORAL | Status: AC
  Administered 2020-10-15: 500 mg via ORAL
  Filled 2020-10-15: qty 1

## 2020-10-15 MED ORDER — LEVOFLOXACIN 500 MG PO TABS: 500.0000 mg | ORAL_TABLET | Freq: Every day | ORAL | 0 refills | Status: DC

## 2020-10-15 NOTE — ED Provider Notes (Signed)
[facility] EMERGENCY DEPARTMENT Provider Note   CSN: [id number] Arrival date & time: 10/14/20  7829     History Chief Complaint  Patient presents with   Groin Swelling    Gilbert Harris. is a 75 y.o. male.  The history is provided by the patient.     Patient with history of CAD, hypertension presents with groin swelling.  Patient reports left scrotal swelling for the past 4 days.  He reports recent heavy lifting.  He reports he now has fevers.  No vomiting or diarrhea.  No dysuria.  No penile discharge.  He reports he is not sexually active.  He reports his course is worsening.  Activity worsens his symptoms Denies nocturia.  Denies any known issues with his prostate Past Medical History:  Diagnosis Date   Carotid stenosis 01/05/2016   Coronary artery disease    CABG x4 LIMA to LAD, SV graft to RCA, sequential saphenous vein graft to diagnal and OM1   HTN (hypertension)    Hyperlipidemia    MI (myocardial infarction) (HCC)    Schizophrenia (HCC)     Patient Active Problem List   Diagnosis Date Noted   Carotid stenosis 01/05/2016   Prediabetes 01/14/2015   Chronic kidney disease (CKD) stage G2/A3, mildly decreased glomerular filtration rate (GFR) between 60-89 mL/min/1.73 square meter and albuminuria creatinine ratio greater than 300 mg/g 02/12/2014   Thrombocytopenia (HCC) 07/10/2013   Annual physical exam 06/09/2013   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   HYPERCHOLESTEROLEMIA 08/13/2008   Essential hypertension 08/13/2008   Coronary atherosclerosis 08/13/2008    Past Surgical History:  Procedure Laterality Date   CARDIAC CATHETERIZATION     EF 50%--severe two vessel obstructive atherosclerotic coronary artery disease--Low normal left ventricle funcrtion   CORONARY ARTERY BYPASS GRAFT  2007   HERNIA REPAIR     TONSILLECTOMY AND ADENOIDECTOMY Bilateral y-59       Family History  Problem Relation Age of Onset    Heart disease Mother    Heart attack Mother    Heart disease Father    Heart attack Father    Heart disease Maternal Grandmother    Stroke Maternal Grandfather    Heart failure Maternal Aunt     Social History   Tobacco Use   Smoking status: Former    Types: Cigarettes    Quit date: 02/03/2006    Years since quitting: 14.7   Smokeless tobacco: Never  Vaping Use   Vaping Use: Never used  Substance Use Topics   Alcohol use: No    Alcohol/week: 0.0 standard drinks   Drug use: No    Home Medications Prior to Admission medications   Medication Sig Start Date End Date Taking? Authorizing Provider  amLODipine (NORVASC) 5 MG tablet Take 1 tablet (5 mg total) by mouth in the morning and at bedtime. 09/13/20   Parke Poisson, MD  aspirin 81 MG EC tablet Take 81 mg by mouth daily. Swallow whole.    [provider]  atorvastatin (LIPITOR) 20 MG tablet Take 1 tablet (20 mg total) by mouth daily. 09/13/20   Parke Poisson, MD  busPIRone (BUSPAR) 5 MG tablet Take 1 tablet (5 mg total) by mouth 2 (two) times daily. 10/06/20   Plovsky, Earvin Hansen, MD  chlorthalidone (HYGROTON) 25 MG tablet Take 1 tablet (25 mg total) by mouth daily. 09/07/20 12/06/20  Chilton Si, MD  Cholecalciferol (VITAMIN D-3) 5000 units TABS Take 5,000 Units by mouth daily.  [provider]  hydroxypropyl methylcellulose / hypromellose (ISOPTO TEARS / GONIOVISC) 2.5 % ophthalmic solution Place 1 drop into both eyes 4 (four) times daily as needed for dry eyes.    [provider]  metoprolol tartrate (LOPRESSOR) 50 MG tablet Take 1 tablet (50 mg total) by mouth 2 (two) times daily. 09/13/20   Parke Poisson, MD  Multiple Vitamin (MULTIVITAMIN) tablet Take 1 tablet by mouth daily.      [provider]  nitroGLYCERIN (NITROSTAT) 0.4 MG SL tablet DISSOLVE 1 TABLET UNDER THE TONGUE EVERY 5 MINUTES AS  NEEDED FOR CHEST PAIN UP TO 3 TABLETS IN 15 MINUTES,  CALL 911 IF PAIN PERSISTS  04/28/20   Chilton Si, MD  thioridazine (MELLARIL) 25 MG tablet 1  qhs 10/06/20   Plovsky, Earvin Hansen, MD    Allergies    Haloperidol decanoate and Haloperidol lactate  Review of Systems   Review of Systems  Constitutional:  Positive for chills and fever.  Respiratory:  Negative for shortness of breath.   Cardiovascular:  Negative for chest pain.  Gastrointestinal:  Negative for abdominal pain, diarrhea and vomiting.  Genitourinary:  Positive for scrotal swelling and testicular pain. Negative for dysuria and penile discharge.  All other systems reviewed and are negative.  Physical Exam Updated Vital Signs BP 124/86 (BP Location: Right Arm)   Pulse 93   Temp (!) 100.4 F (38 C) (Oral)   Resp 18   Ht 1.829 m (6')   Wt 94 kg   SpO2 94%   BMI 28.11 kg/m   Physical Exam CONSTITUTIONAL: Well developed/well nourished HEAD: Normocephalic/atraumatic EYES: EOMI/PERRL ENMT: Mucous membranes moist NECK: supple no meningeal signs SPINE/BACK:entire spine nontender CV: S1/S2 noted, no murmurs/rubs/gallops noted LUNGS: Lungs are clear to auscultation bilaterally, no apparent distress ABDOMEN: soft, nontender, no rebound or guarding, bowel sounds noted throughout abdomen GU:no cva tenderness Right testicle descended and nontender. Left testicle is enlarged and tender to palpation.  No inguinal hernia.  He is uncircumcised, but no penile discharge or penile lesions.  Nurse chaperone present for exam NEURO: Pt is awake/alert/appropriate, moves all extremitiesx4.  No facial droop.   EXTREMITIES: pulses normal/equal, full ROM SKIN: warm, color normal PSYCH: no abnormalities of mood noted, alert and oriented to situation  ED Results / Procedures / Treatments   Labs (all labs ordered are listed, but only abnormal results are displayed) Labs Reviewed  COMPREHENSIVE METABOLIC PANEL - Abnormal; Notable for the following components:      Result Value   Sodium 131 (*)    Potassium 3.4 (*)     Chloride 93 (*)    Glucose, Bld 123 (*)    Creatinine, Ser 1.46 (*)    GFR, Estimated 50 (*)    All other components within normal limits  CBC - Abnormal; Notable for the following components:   Platelets 136 (*)    All other components within normal limits  URINALYSIS, ROUTINE W REFLEX MICROSCOPIC - Abnormal; Notable for the following components:   Color, Urine AMBER (*)    APPearance CLOUDY (*)    Hgb urine dipstick SMALL (*)    Protein, ur 30 (*)    Leukocytes,Ua LARGE (*)    WBC, UA >50 (*)    Bacteria, UA FEW (*)    All other components within normal limits  LIPASE, BLOOD    EKG None  Radiology US SCROTUM W/DOPPLER  Result Date: 10/14/2020 CLINICAL DATA:  Left testicular pain and swelling for several days EXAM: SCROTAL  ULTRASOUND DOPPLER ULTRASOUND OF THE TESTICLES TECHNIQUE: Complete ultrasound examination of the testicles, epididymis, and other scrotal structures was performed. Color and spectral Doppler ultrasound were also utilized to evaluate blood flow to the testicles. COMPARISON:  None. FINDINGS: Right testicle Measurements: 3.7 x 2.1 x 3.2 cm.  Prominent rete testes is noted. Left testicle Measurements: 4.4 x 2.3 x 3.3 cm. Diffuse increased vascularity is noted. No focal mass is noted. Right epididymis:  4 mm cyst is noted within the right epididymis. Left epididymis: Left epididymis is prominent with some cystic change measuring up to 5 mm. Diffuse increased vascularity is noted. Hydrocele: Small bilateral hydroceles are noted. The hydrocele on the left is mildly complex likely related to the adjacent testicular and epididymal issues. Varicocele:  Bilateral varicoceles are seen left greater than right. Pulsed Doppler interrogation of both testes demonstrates normal low resistance arterial and venous waveforms bilaterally. IMPRESSION: Changes consistent with left epididymo-orchitis. Bilateral hydroceles. Bilateral varicoceles. Electronically Signed   By: Alcide Clever M.D.    On: 10/14/2020 09:00    Procedures Procedures   Medications Ordered in ED Medications  acetaminophen (TYLENOL) tablet 650 mg (650 mg Oral Given 10/14/20 2346)    ED Course  I have reviewed the triage vital signs and the nursing notes.  Pertinent labs & imaging results that were available during my care of the patient were reviewed by me and considered in my medical decision making (see chart for details).    MDM Rules/Calculators/A&P                           Patient found to have epididymo-orchitis. He reports he is not sexually active. This is likely related to prostatitis. Discussed at length with pharmacist.  They recommend Levaquin given his age and likely culprit of infection will be susceptible to Levaquin.  Even with his renal insufficiency, this is the best choice.  He will be given a total of 10 days Levaquin.  Advise close follow-up with urology in 10-14 days Patient was febrile here, but overall well-appearing.  He is not septic appearing.  He is ambulatory Final Clinical Impression(s) / ED Diagnoses Final diagnoses:  Orchitis and epididymitis    Rx / DC Orders ED Discharge Orders          Ordered    levofloxacin (LEVAQUIN) 500 MG tablet  Daily        10/15/20 0011    HYDROcodone-acetaminophen (NORCO/VICODIN) 5-325 MG tablet  Every 6 hours PRN        10/15/20 0011             Zadie Rhine, MD 10/15/20 [phone]

## 2020-10-19 ENCOUNTER — Other Ambulatory Visit: Payer: Self-pay

## 2020-10-19 ENCOUNTER — Ambulatory Visit (INDEPENDENT_AMBULATORY_CARE_PROVIDER_SITE_OTHER): Payer: Medicare HMO | Admitting: Pharmacist Clinician (PhC)/ Clinical Pharmacy Specialist

## 2020-10-19 DIAGNOSIS — I10 Essential (primary) hypertension: Secondary | ICD-10-CM

## 2020-10-19 MED ORDER — CHLORTHALIDONE 25 MG PO TABS: 25.0000 mg | ORAL_TABLET | Freq: Every day | ORAL | 3 refills | Status: DC

## 2020-10-19 NOTE — Assessment & Plan Note (Signed)
Patient with essential hypertension, most recent list of home readings show average BP of 107/70.  He did not take any medications this morning, as he wasn't sure if he should.  Currently no symptoms of hypotension.  Will have him continue with his current regimen and monitor three days per week.  He should reach out to the office if he notices symptoms of hypotension or systolic readings consistently < 100.

## 2020-10-19 NOTE — Patient Instructions (Signed)
Return for a a follow up appointment with Dr. Duke Salvia in March 2023 (you should get a letter about January to make an appointment)  Call our office if you notice your home BP readings are going too low (you get symptoms) or if more than half of the readings are < 100 on the top number).   Belenda Cruise at [phone]  Check your blood pressure at home 2-3 days each week and keep record of the readings.  Take your BP meds as follows:  Continue with your current medications  Bring all of your meds, your BP cuff and your record of home blood pressures to your next appointment.  Exercise as you're able, try to walk approximately 30 minutes per day.  Keep salt intake to a minimum, especially watch canned and prepared boxed foods.  Eat more fresh fruits and vegetables and fewer canned items.  Avoid eating in fast food restaurants.    HOW TO TAKE YOUR BLOOD PRESSURE: Rest 5 minutes before taking your blood pressure.  Don't smoke or drink caffeinated beverages for at least 30 minutes before. Take your blood pressure before (not after) you eat. Sit comfortably with your back supported and both feet on the floor (don't cross your legs). Elevate your arm to heart level on a table or a desk. Use the proper sized cuff. It should fit smoothly and snugly around your bare upper arm. There should be enough room to slip a fingertip under the cuff. The bottom edge of the cuff should be 1 inch above the crease of the elbow. Ideally, take 3 measurements at one sitting and record the average.

## 2020-10-19 NOTE — Progress Notes (Signed)
Patient ID: Gilbert Harris.                 DOB: 1945/06/26                      MRN: [id number]     HPI: Gilbert Harris. is a 75 y.o. male referred by Dr. Sharyne Richters to HTN clinic. PMH includes uncontrolled hypertension, hyperlipidemia, CAD s/p MI and CABG in 2007, carotid stenosis, hx of syncope, and schizophrenia.   When he was seen originally in April his pressure was 144/98 and amlodipine 5 mg was added.  A month later he was seen in [facility] and there was some improvement, down to 134/92.  The amlodipine was increased to 10 mg daily (5 mg bid).  Because of transportation issues, have been avoiding adding ACEI/ARB, as for those, he would need to return in 2 weeks for labs.    Today he returns for follow up.  In speaking with him he notes that he only takes amlodipine and metoprolol for his blood pressure, atorvastatin and thioridazine.  Not sure if he is using buspirone, so will leave on his profile for now.  He doesn't recognize hctz and doesn't think he's taking it.  He has no complaints today, is feeling well overall and is now doing a video exercise program twice weekly.  States compliance with his medication.    At his last visit we stopped hctz and gave him chlorthalidone 25 mg daily.  A metabolic panel drawn at the hospital last week showed his sodium and potassium levels slightly low, but otherwise fine.  Today he reports feeling well overall, no symptoms of hypotension with low BP readings.  States compliance with his medications.    Current HTN meds:  Amlodipine 5mg  bid Chlorthalidone 25mg  daily Metoprolol tartrate 50mg  BID  BP goal: 130/80  Family History: Heart attack in his father and mother; Heart disease in his father, maternal grandmother, and mother; Heart failure in his maternal aunt; Stroke in his maternal grandfather. HTN in mother, father,and siblings.  Social History: denies alcohol or tobacco - stopped > 14 years ago  Diet: mainly cooks at home; has ben told he is  pre-diabetic so has moved his dietary focus to that and thinks he may be eating more sodium.    Exercise: walking around the block and pacing within apartment; exercise twice weekly with video - arm and leg conditioning  Home BP readings: 23 readings average 107/70, with mornings at 105/69 and evenings at 111/73.  Range 86-127/59-78, HR range 60-102  Labs:  4/22:  Na 139, K 4.5, Glu 95, BUN 11, SCr 1.36, GFR 55  Wt Readings from Last 3 Encounters:  10/19/20 205 lb (93 kg)  10/14/20 207 lb 3.7 oz (94 kg)  09/07/20 207 lb 6.4 oz (94.1 kg)   BP Readings from Last 3 Encounters:  10/19/20 138/90  10/15/20 (!) 118/91  09/07/20 (!) 146/94   Pulse Readings from Last 3 Encounters:  10/19/20 79  10/15/20 89  09/07/20 68    Past Medical History:  Diagnosis Date   Carotid stenosis 01/05/2016   Coronary artery disease    CABG x4 LIMA to LAD, SV graft to RCA, sequential saphenous vein graft to diagnal and OM1   HTN (hypertension)    Hyperlipidemia    MI (myocardial infarction) (HCC)    Schizophrenia (HCC)     Current Outpatient Medications on File Prior to Visit  Medication Sig Dispense Refill  amLODipine (NORVASC) 5 MG tablet Take 1 tablet (5 mg total) by mouth in the morning and at bedtime. 180 tablet 3   Ascorbic Acid (VITAMIN C) 100 MG tablet Take 100 mg by mouth daily.     aspirin 81 MG EC tablet Take 81 mg by mouth daily. Swallow whole.     atorvastatin (LIPITOR) 20 MG tablet Take 1 tablet (20 mg total) by mouth daily. 90 tablet 3   busPIRone (BUSPAR) 5 MG tablet Take 1 tablet (5 mg total) by mouth 2 (two) times daily. 60 tablet 7   chlorthalidone (HYGROTON) 25 MG tablet Take 1 tablet (25 mg total) by mouth daily. 30 tablet 3   Cholecalciferol (VITAMIN D-3) 5000 units TABS Take 5,000 Units by mouth daily.     hydroxypropyl methylcellulose / hypromellose (ISOPTO TEARS / GONIOVISC) 2.5 % ophthalmic solution Place 1 drop into both eyes 4 (four) times daily as needed for dry eyes.      levofloxacin (LEVAQUIN) 500 MG tablet Take 1 tablet (500 mg total) by mouth daily. 9 tablet 0   metoprolol tartrate (LOPRESSOR) 50 MG tablet Take 1 tablet (50 mg total) by mouth 2 (two) times daily. 180 tablet 3   Multiple Vitamin (MULTIVITAMIN) tablet Take 1 tablet by mouth daily.       nitroGLYCERIN (NITROSTAT) 0.4 MG SL tablet DISSOLVE 1 TABLET UNDER THE TONGUE EVERY 5 MINUTES AS  NEEDED FOR CHEST PAIN UP TO 3 TABLETS IN 15 MINUTES,  CALL 911 IF PAIN PERSISTS 100 tablet 3   thioridazine (MELLARIL) 25 MG tablet 1  qhs 30 tablet 7   No current facility-administered medications on file prior to visit.    Allergies  Allergen Reactions   Haloperidol Decanoate Shortness Of Breath   Haloperidol Lactate Other (See Comments)    Blood pressure 138/90, pulse 79, resp. rate 14, height 6' (1.829 m), weight 205 lb (93 kg), SpO2 98 %.  Essential hypertension Patient with essential hypertension, most recent list of home readings show average BP of 107/70.  He did not take any medications this morning, as he wasn't sure if he should.  Currently no symptoms of hypotension.  Will have him continue with his current regimen and monitor three days per week.  He should reach out to the office if he notices symptoms of hypotension or systolic readings consistently < 100.   Phillips Hay PharmD CPP [facility] [address] [place] [address] 10/19/2020 9:37 AM

## 2020-11-11 ENCOUNTER — Other Ambulatory Visit: Payer: Self-pay | Admitting: Cardiovascular Disease

## 2020-11-11 NOTE — Telephone Encounter (Signed)
Rx(s) sent to pharmacy electronically.  

## 2020-12-10 ENCOUNTER — Other Ambulatory Visit: Payer: Self-pay | Admitting: *Deleted

## 2020-12-10 MED ORDER — CHLORTHALIDONE 25 MG PO TABS: 25.0000 mg | ORAL_TABLET | Freq: Every day | ORAL | 1 refills | Status: DC

## 2020-12-10 NOTE — Telephone Encounter (Signed)
Rx(s) sent to pharmacy electronically.  

## 2021-03-08 ENCOUNTER — Other Ambulatory Visit: Payer: Self-pay

## 2021-03-08 ENCOUNTER — Telehealth (HOSPITAL_COMMUNITY): Payer: Medicare HMO | Admitting: Psychiatry

## 2021-03-08 DIAGNOSIS — F200 Paranoid schizophrenia: Secondary | ICD-10-CM

## 2021-03-08 MED ORDER — THIORIDAZINE HCL 25 MG PO TABS: ORAL_TABLET | ORAL | 7 refills | Status: DC

## 2021-03-08 MED ORDER — BUSPIRONE HCL 5 MG PO TABS: 5.0000 mg | ORAL_TABLET | Freq: Two times a day (BID) | ORAL | 7 refills | Status: DC

## 2021-03-08 NOTE — Progress Notes (Signed)
Patient ID: Gilbert Harris., male   DOB: 29-Nov-1945, 75 y.o.   MRN: [id number] [facility] MD Progress Note  03/08/2021 2:07 PM Gilbert Harris.  MRN:  [id number] Subjective: Doing well Principal Problem: Schizophrenia residual Diagreat.  Schizophrenia in remission  Today the patient is at his baseline.  He is doing very well.  He lives at [facility] with approximately 20 other residents.  Patient is very active.  He continues to do a lot of art work.  He shows no evidence of psychosis.  Financially he is very stable.  He likes where he lives.  He has 2 sons who are both doing well.  The patient actually has 4 great grandchildren.  The patient goes to the [facility] with other residents on a regular basis.  The patient stays active physically.  He exercises has a treadmill and stays active.  He goes to the [employer] often.  Patient denies any chest pain or shortness of breath.  His cardiac status is completely stable.  He drinks no alcohol and uses no drugs.  He has been at this facility that he lives in for 30 years.  He is very stable.   Past Surgical History:  Procedure Laterality Date   CARDIAC CATHETERIZATION     EF 50%--severe two vessel obstructive atherosclerotic coronary artery disease--Low normal left ventricle funcrtion   CORONARY ARTERY BYPASS GRAFT  2007   HERNIA REPAIR     TONSILLECTOMY AND ADENOIDECTOMY Bilateral y-59   Family History:  Family History  Problem Relation Age of Onset   Heart disease Mother    Heart attack Mother    Heart disease Father    Heart attack Father    Heart disease Maternal Grandmother    Stroke Maternal Grandfather    Heart failure Maternal Aunt    Family Psychiatric  History:  Social History:  Social History   Substance and Sexual Activity  Alcohol Use No   Alcohol/week: 0.0 standard drinks     Social History   Substance and Sexual Activity  Drug Use No    Social History   Socioeconomic History   Marital status: Single     Spouse name: Not on file   Number of children: Not on file   Years of education: Not on file   Highest education level: Not on file  Occupational History   Not on file  Tobacco Use   Smoking status: Former    Types: Cigarettes    Quit date: 02/03/2006    Years since quitting: 15.1   Smokeless tobacco: Never  Vaping Use   Vaping Use: Never used  Substance and Sexual Activity   Alcohol use: No    Alcohol/week: 0.0 standard drinks   Drug use: No   Sexual activity: Not Currently  Other Topics Concern   Not on file  Social History Narrative   Not on file   Social Determinants of Health   Financial Resource Strain: Not on file  Food Insecurity: Not on file  Transportation Needs: Not on file  Physical Activity: Not on file  Stress: Not on file  Social Connections: Not on file   Additional Social History:                         Sleep: Good  Appetite:  Good  Current Medications: Current Outpatient Medications  Medication Sig Dispense Refill   amLODipine (NORVASC) 5 MG tablet TAKE 1 TABLET(5 MG) BY MOUTH  IN THE MORNING AND AT BEDTIME 180 tablet 2   Ascorbic Acid (VITAMIN C) 100 MG tablet Take 100 mg by mouth daily.     aspirin 81 MG EC tablet Take 81 mg by mouth daily. Swallow whole.     atorvastatin (LIPITOR) 20 MG tablet Take 1 tablet (20 mg total) by mouth daily. 90 tablet 3   busPIRone (BUSPAR) 5 MG tablet Take 1 tablet (5 mg total) by mouth 2 (two) times daily. 60 tablet 7   chlorthalidone (HYGROTON) 25 MG tablet Take 1 tablet (25 mg total) by mouth daily. 90 tablet 1   Cholecalciferol (VITAMIN D-3) 5000 units TABS Take 5,000 Units by mouth daily.     hydroxypropyl methylcellulose / hypromellose (ISOPTO TEARS / GONIOVISC) 2.5 % ophthalmic solution Place 1 drop into both eyes 4 (four) times daily as needed for dry eyes.     levofloxacin (LEVAQUIN) 500 MG tablet Take 1 tablet (500 mg total) by mouth daily. 9 tablet 0   metoprolol tartrate (LOPRESSOR) 50 MG  tablet Take 1 tablet (50 mg total) by mouth 2 (two) times daily. 180 tablet 3   Multiple Vitamin (MULTIVITAMIN) tablet Take 1 tablet by mouth daily.       nitroGLYCERIN (NITROSTAT) 0.4 MG SL tablet DISSOLVE 1 TABLET UNDER THE TONGUE EVERY 5 MINUTES AS  NEEDED FOR CHEST PAIN UP TO 3 TABLETS IN 15 MINUTES,  CALL 911 IF PAIN PERSISTS 100 tablet 3   thioridazine (MELLARIL) 25 MG tablet 1  qhs 30 tablet 7   No current facility-administered medications for this visit.    Lab Results: No results found for this or any previous visit (from the past 48 hour(s)).  Physical Findings: AIMS:  , ,  ,  ,    CIWA:    COWS:     Musculoskeletal: Strength & Muscle Tone: within normal limits Gait & Station: normal Patient leans:  Psychiatric Specialty Exam: ROS  There were no vitals taken for this visit.There is no height or weight on file to calculate BMI.  General Appearance: Meticulous  Eye Contact::  Good  Speech:  Clear and Coherent  Volume:  Normal  Mood:  Euthymic  Affect:  Appropriate  Thought Process:  Coherent  Orientation:  Full (Time, Place, and Person)  Thought Content:  WDL  Suicidal Thoughts:  No  Homicidal Thoughts:  No  Memory:  NA  Judgement:  NA  Insight:  Good  Psychomotor Activity:  Normal  Concentration:  Good  Recall:  Good  Fund of Knowledge:Good  Language: Good  Akathisia:  No  Handed:  Right  AIMS (if indicated):     Assets:  Communication Skills  ADL's:  Intact  Cognition: WNL  Sleep:      Treatment Plan Summary: 03/08/2021, 2:07 PM   This patient has 2 problems.  He has a diagnosis of paranoid schizophrenia in remission.  He continues taking Mellaril 25 mg nightly.  His second problem is an adjustment disorder with an anxious mood state.  He takes BuSpar 5 mg twice daily.  The patient is doing very well.  He will return to see me in 6 months.

## 2021-03-09 ENCOUNTER — Telehealth (HOSPITAL_COMMUNITY): Payer: Medicare HMO | Admitting: Psychiatry

## 2021-03-22 ENCOUNTER — Other Ambulatory Visit (HOSPITAL_BASED_OUTPATIENT_CLINIC_OR_DEPARTMENT_OTHER): Payer: Self-pay

## 2021-03-22 MED ORDER — METOPROLOL TARTRATE 50 MG PO TABS: 50.0000 mg | ORAL_TABLET | Freq: Two times a day (BID) | ORAL | 3 refills | Status: DC

## 2021-03-22 MED ORDER — AMLODIPINE BESYLATE 5 MG PO TABS: ORAL_TABLET | ORAL | 3 refills | Status: DC

## 2021-03-24 ENCOUNTER — Other Ambulatory Visit (HOSPITAL_BASED_OUTPATIENT_CLINIC_OR_DEPARTMENT_OTHER): Payer: Self-pay

## 2021-03-24 MED ORDER — AMLODIPINE BESYLATE 5 MG PO TABS: ORAL_TABLET | ORAL | 3 refills | Status: DC

## 2021-03-24 MED ORDER — METOPROLOL TARTRATE 50 MG PO TABS: 50.0000 mg | ORAL_TABLET | Freq: Two times a day (BID) | ORAL | 3 refills | Status: DC

## 2021-07-15 ENCOUNTER — Ambulatory Visit (HOSPITAL_BASED_OUTPATIENT_CLINIC_OR_DEPARTMENT_OTHER): Payer: Medicare HMO | Admitting: Cardiovascular Disease

## 2021-07-15 ENCOUNTER — Encounter (HOSPITAL_BASED_OUTPATIENT_CLINIC_OR_DEPARTMENT_OTHER): Payer: Self-pay | Admitting: Cardiovascular Disease

## 2021-07-15 VITALS — BP 114/80 | HR 70 | Ht 72.0 in | Wt 201.4 lb

## 2021-07-15 DIAGNOSIS — R5383 Other fatigue: Secondary | ICD-10-CM | POA: Diagnosis not present

## 2021-07-15 DIAGNOSIS — I10 Essential (primary) hypertension: Secondary | ICD-10-CM

## 2021-07-15 DIAGNOSIS — E7800 Pure hypercholesterolemia, unspecified: Secondary | ICD-10-CM | POA: Diagnosis not present

## 2021-07-15 HISTORY — DX: Other fatigue: R53.83

## 2021-07-15 NOTE — Patient Instructions (Signed)
Medication Instructions:  ?Your physician recommends that you continue on your current medications as directed. Please refer to the Current Medication list given to you today. ? ?*If you need a refill on your cardiac medications before your next appointment, please call your pharmacy* ? ?Lab Work: ?LP/CMET/CBC/TSH/CBC TODAY ? ?If you have labs (blood work) drawn today and your tests are completely normal, you will receive your results only by: ?MyChart Message (if you have MyChart) OR ?A paper copy in the mail ?If you have any lab test that is abnormal or we need to change your treatment, we will call you to review the results. ? ?Testing/Procedures: ?NONE ? ?Follow-Up: ?At [facility], you and your health needs are our priority.  As part of our continuing mission to provide you with exceptional heart care, we have created designated Provider Care Teams.  These Care Teams include your primary Cardiologist (physician) and Advanced Practice Providers (APPs -  Physician Assistants and Nurse Practitioners) who all work together to provide you with the care you need, when you need it. ? ?We recommend signing up for the patient portal called "MyChart".  Sign up information is provided on this After Visit Summary.  MyChart is used to connect with patients for Virtual Visits (Telemedicine).  Patients are able to view lab/test results, encounter notes, upcoming appointments, etc.  Non-urgent messages can be sent to your provider as well.   ?To learn more about what you can do with MyChart, go to [email or web address].   ? ?Your next appointment:   ?12 month(s) ? ?The format for your next appointment:   ?In Person ? ?Provider:   ?Chilton Si, MD  ? ?Important Information About Sugar ? ? ? ? ? ?

## 2021-07-15 NOTE — Progress Notes (Signed)
? ? ?Cardiology Office Note ? ? ?Date:  07/15/2021  ? ?ID:  Gilbert Hummer., DOB 1946/02/04, MRN [id number] ? ?PCP:  Sandi Mariscal, MD  ?Cardiologist:   Skeet Latch, MD  ? ?No chief complaint on file. ? ?  ?History of Present Illness: ?Gilbert Harris. is a 76 y.o. male with hypertension, hyperlipidemia, CAD s/p MI and CABG in 2007, mild carotid stenosis, syncope, and schizophrenia who presents for follow up.  At his last appointment his BP was more elevated.  Amlodipine was increased and he was referred to our PharmD.  HCTZ was switched to chlorthalidone.  His home BP averaged 107/70, though his in office BP was in the 130s.  No changes were made at his 09/2020 appointment.  Lately he has been feeling well.  He gets exercise going to the gym once per week.  He is constantly walking around his apartment and likes to paint.  He has no exertional chest pain or shortness of breath.  He denies LE edema, orthopnea or PND.  He has been taking more naps lately and feeling generally tired.  Since he increased his exercise this has improved somewhat.  He is otherwise well. 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  ? ?Past Medical History:  ?Diagnosis Date  ? Carotid stenosis 01/05/2016  ? Coronary artery disease   ? CABG x4 LIMA to LAD, SV graft to RCA, sequential saphenous vein graft to diagnal and OM1  ? Fatigue 07/15/2021  ? HTN (hypertension)   ? Hyperlipidemia   ? MI (myocardial infarction) ([place])   ? Schizophrenia ([place])   ? ? ?Past Surgical History:  ?Procedure Laterality Date  ? CARDIAC CATHETERIZATION    ? EF 50%--severe two vessel obstructive atherosclerotic coronary artery disease--Low normal left ventricle funcrtion  ? CORONARY ARTERY BYPASS GRAFT  2007  ? HERNIA REPAIR    ? TONSILLECTOMY AND ADENOIDECTOMY Bilateral y-59  ? ? ? ?Current Outpatient Medications  ?Medication Sig Dispense Refill  ? amLODipine (NORVASC) 5 MG tablet TAKE 1 TABLET(5 MG) BY MOUTH IN THE MORNING AND AT BEDTIME  180 tablet 3  ? Ascorbic Acid (VITAMIN C) 100 MG tablet Take 100 mg by mouth daily.    ? aspirin 81 MG EC tablet Take 81 mg by mouth daily. Swallow whole.    ? atorvastatin (LIPITOR) 20 MG tablet Take 1 tablet (20 mg total) by mouth daily. 90 tablet 3  ? busPIRone (BUSPAR) 5 MG tablet Take 1 tablet (5 mg total) by mouth 2 (two) times daily. 60 tablet 7  ? chlorthalidone (HYGROTON) 25 MG tablet Take 1 tablet (25 mg total) by mouth daily. 90 tablet 1  ? Cholecalciferol (VITAMIN D-3) 5000 units TABS Take 5,000 Units by mouth daily.    ? hydroxypropyl methylcellulose / hypromellose (ISOPTO TEARS / GONIOVISC) 2.5 % ophthalmic solution Place 1 drop into both eyes 4 (four) times daily as needed for dry eyes.    ? metoprolol tartrate (LOPRESSOR) 50 MG tablet Take 1 tablet (50 mg total) by mouth 2 (two) times daily. 180 tablet 3  ? Multiple Vitamin (MULTIVITAMIN) tablet Take 1 tablet by mouth daily.      ? nitroGLYCERIN (NITROSTAT) 0.4 MG SL tablet DISSOLVE 1 TABLET UNDER THE TONGUE EVERY 5 MINUTES AS  NEEDED FOR CHEST PAIN UP TO 3 TABLETS IN 15 MINUTES,  CALL 911 IF PAIN PERSISTS 100 tablet 3  ? thioridazine (MELLARIL) 25 MG tablet 1  qhs 30  tablet 7  ? ?No current facility-administered medications for this visit.  ? ? ?Allergies:   Haloperidol decanoate and Haloperidol lactate  ? ? ?Social History:  The patient  reports that he quit smoking about 15 years ago. His smoking use included cigarettes. He has never used smokeless tobacco. He reports that he does not drink alcohol and does not use drugs.  ? ?Family History:  The patient's family history includes Heart attack in his father and mother; Heart disease in his father, maternal grandmother, and mother; Heart failure in his maternal aunt; Stroke in his maternal grandfather.  ? ? ?ROS:  Please see the history of present illness. Otherwise, review of systems are positive for none.   All other systems are reviewed and negative.  ? ? ?PHYSICAL EXAM: ?VS:  BP 114/80 (BP  Location: Right Arm, Patient Position: Sitting, Cuff Size: Normal)   Pulse 70   Ht 6' (1.829 m)   Wt 201 lb 6.4 oz (91.4 kg)   BMI 27.31 kg/m?  , BMI Body mass index is 27.31 kg/m?. ?GENERAL:  Well appearing ?HEENT: Pupils equal round and reactive, fundi not visualized, oral mucosa unremarkable ?NECK:  No jugular venous distention, waveform within normal limits, carotid upstroke brisk and symmetric, no bruits ?LUNGS:  Clear to auscultation bilaterally ?HEART:  RRR.  PMI not displaced or sustained,S1 and S2 within normal limits, no S3, no S4, no clicks, no rubs, no murmurs ?ABD:  Flat, positive bowel sounds normal in frequency in pitch, no bruits, no rebound, no guarding, no midline pulsatile mass, no hepatomegaly, no splenomegaly ?EXT:  2 plus pulses throughout, no edema, no cyanosis no clubbing ?SKIN:  No rashes no nodules ?NEURO:  Cranial nerves II through XII grossly intact, motor grossly intact throughout ?PSYCH:  Cognitively intact, oriented to person place and time ? ? ?EKG:   ?06/29/20-Sinus rhythm with 1 PACs. Rate 72 bpm ?The ekg ordered 01/05/16 demonstrates sinus rhythm. Rate 64 bpm. nonspecific T wave abnormalities. ?07/03/16: Sinus rhythm.  Rate 74 bpm.  Lateral T wave abnormalities ?05/14/17: Sinus rhythm.  Rate 69 bpm. ?11/29/17: Sinus rhythm.  Rate 63 bpm.  Nonspecific T wave abnormalities ?06/04/18: Sinus rhythm.  Rate 64 bpm.  Nonspecific T wave abnormalities.  ?07/04/2019: Sinus rhythm.  Rate 72 bpm. Nonspecific T wave abnormalities. ?07/15/21: Sinus rhythm.  Rate 70 bpm.  LBBB ? ?Recent Labs: ?10/14/2020: ALT 34; BUN 12; Creatinine, Ser 1.46; Hemoglobin 13.8; Platelets 136; Potassium 3.4; Sodium 131  ? ?Carotid Doppler 07/2015: 1-39% stenosis bilaterally  ? ?Lipid Panel ?   ?Component Value Date/Time  ? CHOL 142 06/29/2020 1047  ? TRIG 62 06/29/2020 1047  ? HDL 62 06/29/2020 1047  ? CHOLHDL 2.3 06/29/2020 1047  ? CHOLHDL 2.3 04/21/2016 1032  ? VLDL 17 04/21/2016 1032  ? [place] 67 06/29/2020 1047  ? ?   ? ?Wt Readings from Last 3 Encounters:  ?07/15/21 201 lb 6.4 oz (91.4 kg)  ?10/19/20 205 lb (93 kg)  ?10/14/20 207 lb 3.7 oz (94 kg)  ?  ? ?ASSESSMENT AND PLAN: ?Coronary atherosclerosis ?S/p MI and CABG in 2007.  He is doing well and has no angina.  New LBBB on EKG today.  However he is exercising and has no symptoms.  No ischemic evaluation at this time.  Continue aspirin, amlodipine, metoprolol and atorvastatin.  LDL goal <70. ? ?Essential hypertension ?Blood pressure well-controlled on amlodipine, chlorthalidone, and metoprolol.  Check CMP. ? ?HYPERCHOLESTEROLEMIA ?LDL goal <70.  Continue atorvastatin.  Increase exercise to at  least 150 minutes/week.  Check fasting lipids/CMP today. ? ?Fatigue ?Lately he has been feeling more tired and taking more naps.  Check CMP, TSH and CBC.  ? ? ?Current medicines are reviewed at length with the patient today.  The patient does not have concerns regarding medicines. ? ?The following changes have been made:  no change ? ?Labs/ tests ordered today include:  ? ?Orders Placed This Encounter  ?Procedures  ? CBC with Differential/Platelet  ? Lipid panel  ? TSH  ? Comprehensive metabolic panel  ? EKG 12-Lead  ? ? ? ?Disposition:   FU with [place] Rabenold C. Oval Linsey, MD, [facility] in 1 year.  ? ? ? ?Signed, ?Amedio Bowlby C. Oval Linsey, MD, [facility]  ?07/15/2021 8:47 AM    ?[place] ?

## 2021-07-15 NOTE — Assessment & Plan Note (Signed)
Lately he has been feeling more tired and taking more naps.  Check CMP, TSH and CBC.  ?

## 2021-07-15 NOTE — Assessment & Plan Note (Signed)
S/p MI and CABG in 2007.  He is doing well and has no angina.  New LBBB on EKG today.  However he is exercising and has no symptoms.  No ischemic evaluation at this time.  Continue aspirin, amlodipine, metoprolol and atorvastatin.  LDL goal <70. ?

## 2021-07-15 NOTE — Assessment & Plan Note (Signed)
LDL goal <70.  Continue atorvastatin.  Increase exercise to at least 150 minutes/week.  Check fasting lipids/CMP today. ?

## 2021-07-15 NOTE — Assessment & Plan Note (Signed)
Blood pressure well-controlled on amlodipine, chlorthalidone, and metoprolol.  Check CMP. ?

## 2021-07-16 LAB — CBC WITH DIFFERENTIAL/PLATELET
Basophils Absolute: 0.1 10*3/uL (ref 0.0–0.2)
Basos: 1 %
EOS (ABSOLUTE): 0.2 10*3/uL (ref 0.0–0.4)
Eos: 3 %
Hematocrit: 43.9 % (ref 37.5–51.0)
Hemoglobin: 14.5 g/dL (ref 13.0–17.7)
Immature Grans (Abs): 0 10*3/uL (ref 0.0–0.1)
Immature Granulocytes: 0 %
Lymphocytes Absolute: 2.1 10*3/uL (ref 0.7–3.1)
Lymphs: 34 %
MCH: 31.3 pg (ref 26.6–33.0)
MCHC: 33 g/dL (ref 31.5–35.7)
MCV: 95 fL (ref 79–97)
Monocytes Absolute: 0.6 10*3/uL (ref 0.1–0.9)
Monocytes: 9 %
Neutrophils Absolute: 3.4 10*3/uL (ref 1.4–7.0)
Neutrophils: 53 %
Platelets: 173 10*3/uL (ref 150–450)
RBC: 4.63 x10E6/uL (ref 4.14–5.80)
RDW: 12 % (ref 11.6–15.4)
WBC: 6.4 10*3/uL (ref 3.4–10.8)

## 2021-07-16 LAB — COMPREHENSIVE METABOLIC PANEL
ALT: 29 IU/L (ref 0–44)
AST: 28 IU/L (ref 0–40)
Albumin/Globulin Ratio: 2.1 (ref 1.2–2.2)
Albumin: 4.5 g/dL (ref 3.7–4.7)
Alkaline Phosphatase: 66 IU/L (ref 44–121)
BUN/Creatinine Ratio: 8 — ABNORMAL LOW (ref 10–24)
BUN: 12 mg/dL (ref 8–27)
Bilirubin Total: 0.6 mg/dL (ref 0.0–1.2)
CO2: 27 mmol/L (ref 20–29)
Calcium: 9.6 mg/dL (ref 8.6–10.2)
Chloride: 100 mmol/L (ref 96–106)
Creatinine, Ser: 1.42 mg/dL — ABNORMAL HIGH (ref 0.76–1.27)
Globulin, Total: 2.1 g/dL (ref 1.5–4.5)
Glucose: 99 mg/dL (ref 70–99)
Potassium: 4 mmol/L (ref 3.5–5.2)
Sodium: 141 mmol/L (ref 134–144)
Total Protein: 6.6 g/dL (ref 6.0–8.5)
eGFR: 52 mL/min/{1.73_m2} — ABNORMAL LOW (ref >59)

## 2021-07-16 LAB — LIPID PANEL
Chol/HDL Ratio: 2.4 ratio (ref 0.0–5.0)
Cholesterol, Total: 145 mg/dL (ref 100–199)
HDL: 60 mg/dL (ref >39)
LDL Chol Calc (NIH): 70 mg/dL (ref 0–99)
Triglycerides: 76 mg/dL (ref 0–149)
VLDL Cholesterol Cal: 15 mg/dL (ref 5–40)

## 2021-07-16 LAB — TSH: TSH: 2.4 u[IU]/mL (ref 0.450–4.500)

## 2021-08-18 ENCOUNTER — Other Ambulatory Visit: Payer: Self-pay | Admitting: Cardiovascular Disease

## 2021-08-19 NOTE — Telephone Encounter (Signed)
Rx(s) sent to pharmacy electronically.  

## 2021-08-24 ENCOUNTER — Ambulatory Visit (HOSPITAL_BASED_OUTPATIENT_CLINIC_OR_DEPARTMENT_OTHER): Payer: Medicare HMO | Admitting: Psychiatry

## 2021-08-24 DIAGNOSIS — F200 Paranoid schizophrenia: Secondary | ICD-10-CM | POA: Diagnosis not present

## 2021-08-24 MED ORDER — BUSPIRONE HCL 5 MG PO TABS: 5.0000 mg | ORAL_TABLET | Freq: Two times a day (BID) | ORAL | 7 refills | Status: DC

## 2021-08-24 MED ORDER — THIORIDAZINE HCL 25 MG PO TABS: ORAL_TABLET | ORAL | 7 refills | Status: DC

## 2021-08-24 NOTE — Progress Notes (Signed)
Patient ID: Gilbert Harris., male   DOB: 08/26/1945, 76 y.o.   MRN: [id number] [facility] MD Progress Note  08/24/2021 1:55 PM Gilbert Harris.  MRN:  [id number] Subjective: Doing well Principal Problem: Schizophrenia residual Diagreat.  Schizophrenia in remission  Today the patient is doing very well.  He is very well dressed.  He denies daily depression.  He is sleeping and eating well.  He continues to do his art.  He exercises regularly.  He denies any psychotic symptoms at all.  He shows no evidence of tardive dyskinesia.  He is positive and optimistic.  He stays very active.  He continues staying in the [place] house but has 20 people and that he is friends with all.  He has 2 sons who are stable.  Financially the patient is doing well.  He takes his medicines as prescribed.  His weight is good.  He has hypertension that is well controlled.  He sees his primary care doctor on a regular basis.  The patient's thoughts are clear and organized.  He is 76 years of age and feels younger.  He looks much younger.  Presently he is not involved in any romantic relationships.   Past Surgical History:  Procedure Laterality Date   CARDIAC CATHETERIZATION     EF 50%--severe two vessel obstructive atherosclerotic coronary artery disease--Low normal left ventricle funcrtion   CORONARY ARTERY BYPASS GRAFT  2007   HERNIA REPAIR     TONSILLECTOMY AND ADENOIDECTOMY Bilateral y-59   Family History:  Family History  Problem Relation Age of Onset   Heart disease Mother    Heart attack Mother    Heart disease Father    Heart attack Father    Heart disease Maternal Grandmother    Stroke Maternal Grandfather    Heart failure Maternal Aunt    Family Psychiatric  History:  Social History:  Social History   Substance and Sexual Activity  Alcohol Use No   Alcohol/week: 0.0 standard drinks     Social History   Substance and Sexual Activity  Drug Use No    Social History   Socioeconomic History    Marital status: Single    Spouse name: Not on file   Number of children: Not on file   Years of education: Not on file   Highest education level: Not on file  Occupational History   Not on file  Tobacco Use   Smoking status: Former    Types: Cigarettes    Quit date: 02/03/2006    Years since quitting: 15.5   Smokeless tobacco: Never  Vaping Use   Vaping Use: Never used  Substance and Sexual Activity   Alcohol use: No    Alcohol/week: 0.0 standard drinks   Drug use: No   Sexual activity: Not Currently  Other Topics Concern   Not on file  Social History Narrative   Not on file   Social Determinants of Health   Financial Resource Strain: Not on file  Food Insecurity: Not on file  Transportation Needs: Not on file  Physical Activity: Not on file  Stress: Not on file  Social Connections: Not on file   Additional Social History:                         Sleep: Good  Appetite:  Good  Current Medications: Current Outpatient Medications  Medication Sig Dispense Refill   amLODipine (NORVASC) 5 MG tablet TAKE 1 TABLET(5 MG)  BY MOUTH IN THE MORNING AND AT BEDTIME 180 tablet 3   Ascorbic Acid (VITAMIN C) 100 MG tablet Take 100 mg by mouth daily.     aspirin 81 MG EC tablet Take 81 mg by mouth daily. Swallow whole.     atorvastatin (LIPITOR) 20 MG tablet TAKE 1 TABLET(20 MG) BY MOUTH DAILY 90 tablet 2   busPIRone (BUSPAR) 5 MG tablet Take 1 tablet (5 mg total) by mouth 2 (two) times daily. 60 tablet 7   chlorthalidone (HYGROTON) 25 MG tablet Take 1 tablet (25 mg total) by mouth daily. 90 tablet 1   Cholecalciferol (VITAMIN D-3) 5000 units TABS Take 5,000 Units by mouth daily.     hydroxypropyl methylcellulose / hypromellose (ISOPTO TEARS / GONIOVISC) 2.5 % ophthalmic solution Place 1 drop into both eyes 4 (four) times daily as needed for dry eyes.     metoprolol tartrate (LOPRESSOR) 50 MG tablet Take 1 tablet (50 mg total) by mouth 2 (two) times daily. 180 tablet 3    Multiple Vitamin (MULTIVITAMIN) tablet Take 1 tablet by mouth daily.       nitroGLYCERIN (NITROSTAT) 0.4 MG SL tablet DISSOLVE 1 TABLET UNDER THE TONGUE EVERY 5 MINUTES AS  NEEDED FOR CHEST PAIN UP TO 3 TABLETS IN 15 MINUTES,  CALL 911 IF PAIN PERSISTS 100 tablet 3   thioridazine (MELLARIL) 25 MG tablet 1  qhs 30 tablet 7   No current facility-administered medications for this visit.    Lab Results: No results found for this or any previous visit (from the past 48 hour(s)).  Physical Findings: AIMS:  , ,  ,  ,    CIWA:    COWS:     Musculoskeletal: Strength & Muscle Tone: within normal limits Gait & Station: normal Patient leans:  Psychiatric Specialty Exam: ROS  There were no vitals taken for this visit.There is no height or weight on file to calculate BMI.  General Appearance: Meticulous  Eye Contact::  Good  Speech:  Clear and Coherent  Volume:  Normal  Mood:  Euthymic  Affect:  Appropriate  Thought Process:  Coherent  Orientation:  Full (Time, Place, and Person)  Thought Content:  WDL  Suicidal Thoughts:  No  Homicidal Thoughts:  No  Memory:  NA  Judgement:  NA  Insight:  Good  Psychomotor Activity:  Normal  Concentration:  Good  Recall:  Good  Fund of Knowledge:Good  Language: Good  Akathisia:  No  Handed:  Right  AIMS (if indicated):     Assets:  Communication Skills  ADL's:  Intact  Cognition: WNL  Sleep:      Treatment Plan Summary: 08/24/2021, 1:55 PM   This patient's diagnosis is chronic schizophrenia and is in remission.  He shows very few negative symptoms.  He certainly shows no symptoms of psychosis.  He takes Mellaril 25 mg at night.  His second problem is an adjustment disorder with an anxious mood state which is well controlled with BuSpar 5 mg twice daily.  The patient is really doing great.  He is functioning extremely well.  He stays busy at the [place] [facility].  This patient will be seen again in 5 months.

## 2021-08-30 ENCOUNTER — Telehealth (HOSPITAL_COMMUNITY): Payer: Medicare HMO | Admitting: Psychiatry

## 2021-10-30 ENCOUNTER — Other Ambulatory Visit: Payer: Self-pay | Admitting: Cardiovascular Disease

## 2021-10-31 NOTE — Telephone Encounter (Signed)
Rx(s) sent to pharmacy electronically.  

## 2021-11-17 ENCOUNTER — Encounter (HOSPITAL_BASED_OUTPATIENT_CLINIC_OR_DEPARTMENT_OTHER): Payer: Self-pay

## 2021-12-29 ENCOUNTER — Telehealth: Payer: Self-pay | Admitting: Cardiovascular Disease

## 2021-12-29 MED ORDER — AMLODIPINE BESYLATE 5 MG PO TABS: ORAL_TABLET | ORAL | 3 refills | Status: DC

## 2021-12-29 NOTE — Telephone Encounter (Signed)
*  STAT* If patient is at the pharmacy, call can be transferred to refill team.   1. Which medications need to be refilled? (please list name of each medication and dose if known) amLODipine (NORVASC) 5 MG tablet  2. Which pharmacy/location (including street and city if local pharmacy) is medication to be sent to? [place], [place]  3. Do they need a 30 day or 90 day supply? 90 day

## 2022-01-24 ENCOUNTER — Ambulatory Visit (HOSPITAL_BASED_OUTPATIENT_CLINIC_OR_DEPARTMENT_OTHER): Payer: Medicare Other | Admitting: Psychiatry

## 2022-01-24 DIAGNOSIS — F200 Paranoid schizophrenia: Secondary | ICD-10-CM

## 2022-01-24 MED ORDER — THIORIDAZINE HCL 25 MG PO TABS: ORAL_TABLET | ORAL | 7 refills | Status: DC

## 2022-01-24 MED ORDER — BUSPIRONE HCL 5 MG PO TABS: 5.0000 mg | ORAL_TABLET | Freq: Two times a day (BID) | ORAL | 7 refills | Status: DC

## 2022-01-24 NOTE — Progress Notes (Signed)
Patient ID: Gilbert Harris., male   DOB: [birth year], 76 y.o.   MRN: [id number] [facility] MD Progress Note  01/24/2022 2:43 PM Gilbert Harris.  MRN:  [id number] Subjective: Doing well Principal Problem: Schizophrenia residual Diagreat.  Schizophrenia in remission   Today the patient is at his baseline.  He is very well dressed.  He is calm he is easy going.  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  He has no evidence of tardive dyskinesia.  He takes his medicines as prescribed.  He lives in a setting called the [place] [facility] which is a group home that holds 20 people.  He has been a little bit of a turnover of patients.  There is 5 new clients.  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  There are 3 new staff members.  Overall the patient is functioning as usual very well.  Financially he is very stable.  His blood pressure is well controlled.  He shows no evidence of tardive dyskinesia.  His anxiety is well controlled.  He denies any depression.  He sees his primary care doctor regularly.   Past Surgical History:  Procedure Laterality Date   CARDIAC CATHETERIZATION     EF 50%--severe two vessel obstructive atherosclerotic coronary artery disease--Low normal left ventricle funcrtion   CORONARY ARTERY BYPASS GRAFT  2007   HERNIA REPAIR     TONSILLECTOMY AND ADENOIDECTOMY Bilateral y-59   Family History:  Family History  Problem Relation Age of Onset   Heart disease Mother    Heart attack Mother    Heart disease Father    Heart attack Father    Heart disease Maternal Grandmother    Stroke Maternal Grandfather    Heart failure Maternal Aunt    Family Psychiatric  History:  Social History:  Social History   Substance and Sexual Activity  Alcohol Use No   Alcohol/week: 0.0 standard drinks of alcohol     Social History   Substance and Sexual Activity  Drug Use No    Social History   Socioeconomic History   Marital status: Single    Spouse name: Not on  file   Number of children: Not on file   Years of education: Not on file   Highest education level: Not on file  Occupational History   Not on file  Tobacco Use   Smoking status: Former    Types: Cigarettes    Quit date: 02/03/2006    Years since quitting: 15.9   Smokeless tobacco: Never  Vaping Use   Vaping Use: Never used  Substance and Sexual Activity   Alcohol use: No    Alcohol/week: 0.0 standard drinks of alcohol   Drug use: No   Sexual activity: Not Currently  Other Topics Concern   Not on file  Social History Narrative   Not on file   Social Determinants of Health   Financial Resource Strain: Not on file  Food Insecurity: Not on file  Transportation Needs: Not on file  Physical Activity: Not on file  Stress: Not on file  Social Connections: Not on file   Additional Social History:                         Sleep: Good  Appetite:  Good  Current Medications: Current Outpatient Medications  Medication Sig Dispense Refill   amLODipine (NORVASC) 5 MG tablet TAKE 1 TABLET(5 MG) BY  MOUTH IN THE MORNING AND AT BEDTIME 180 tablet 3   Ascorbic Acid (VITAMIN C) 100 MG tablet Take 100 mg by mouth daily.     aspirin 81 MG EC tablet Take 81 mg by mouth daily. Swallow whole.     atorvastatin (LIPITOR) 20 MG tablet TAKE 1 TABLET(20 MG) BY MOUTH DAILY 90 tablet 2   busPIRone (BUSPAR) 5 MG tablet Take 1 tablet (5 mg total) by mouth 2 (two) times daily. 60 tablet 7   chlorthalidone (HYGROTON) 25 MG tablet TAKE 1 TABLET(25 MG) BY MOUTH DAILY 90 tablet 2   Cholecalciferol (VITAMIN D-3) 5000 units TABS Take 5,000 Units by mouth daily.     hydroxypropyl methylcellulose / hypromellose (ISOPTO TEARS / GONIOVISC) 2.5 % ophthalmic solution Place 1 drop into both eyes 4 (four) times daily as needed for dry eyes.     metoprolol tartrate (LOPRESSOR) 50 MG tablet Take 1 tablet (50 mg total) by mouth 2 (two) times daily. 180 tablet 3   Multiple Vitamin (MULTIVITAMIN) tablet Take 1  tablet by mouth daily.       nitroGLYCERIN (NITROSTAT) 0.4 MG SL tablet DISSOLVE 1 TABLET UNDER THE TONGUE EVERY 5 MINUTES AS  NEEDED FOR CHEST PAIN UP TO 3 TABLETS IN 15 MINUTES,  CALL 911 IF PAIN PERSISTS 100 tablet 3   thioridazine (MELLARIL) 25 MG tablet 1  qhs 30 tablet 7   No current facility-administered medications for this visit.    Lab Results: No results found for this or any previous visit (from the past 48 hour(s)).  Physical Findings: AIMS:  , ,  ,  ,    CIWA:    COWS:     Musculoskeletal: Strength & Muscle Tone: within normal limits Gait & Station: normal Patient leans:  Psychiatric Specialty Exam: ROS  There were no vitals taken for this visit.There is no height or weight on file to calculate BMI.  General Appearance: Meticulous  Eye Contact::  Good  Speech:  Clear and Coherent  Volume:  Normal  Mood:  Euthymic  Affect:  Appropriate  Thought Process:  Coherent  Orientation:  Full (Time, Place, and Person)  Thought Content:  WDL  Suicidal Thoughts:  No  Homicidal Thoughts:  No  Memory:  NA  Judgement:  NA  Insight:  Good  Psychomotor Activity:  Normal  Concentration:  Good  Recall:  Good  Fund of Knowledge:Good  Language: Good  Akathisia:  No  Handed:  Right  AIMS (if indicated):     Assets:  Communication Skills  ADL's:  Intact  Cognition: WNL  Sleep:      Treatment Plan Summary: 01/24/2022, 2:43 PM    Today the patient is doing very well.  His diagnosis is schizophrenia in remission.  We will continue taking 25 mg of the Mellaril.  The second problem is adjustment disorder with an anxious mood state.  We will continue taking 5 mg of BuSpar twice daily.  The patient is doing very well.  He functions very well.  He is happy with life.  The return to see me in 7 months.

## 2022-04-17 ENCOUNTER — Telehealth: Payer: Self-pay | Admitting: Cardiovascular Disease

## 2022-04-17 MED ORDER — NITROGLYCERIN 0.4 MG SL SUBL: SUBLINGUAL_TABLET | SUBLINGUAL | 3 refills | Status: DC

## 2022-04-17 NOTE — Telephone Encounter (Signed)
*  STAT* If patient is at the pharmacy, call can be transferred to refill team.   1. Which medications need to be refilled? (please list name of each medication and dose if known)   nitroGLYCERIN (NITROSTAT) 0.4 MG SL tablet    2. Which pharmacy/location (including street and city if local pharmacy) is medication to be sent to? [id number] - [place], [place] - [address] AT [address]   3. Do they need a 30 day or 90 day supply?  90 day  

## 2022-04-17 NOTE — Telephone Encounter (Signed)
Rx request sent to pharmacy.  

## 2022-06-05 ENCOUNTER — Telehealth (HOSPITAL_BASED_OUTPATIENT_CLINIC_OR_DEPARTMENT_OTHER): Payer: Self-pay

## 2022-06-05 MED ORDER — ATORVASTATIN CALCIUM 20 MG PO TABS: 20.0000 mg | ORAL_TABLET | Freq: Every day | ORAL | 1 refills | Status: DC

## 2022-06-05 NOTE — Telephone Encounter (Signed)
Received fax from [facility] requesting refills for Atorvastatin. Rx request sent to pharmacy.

## 2022-06-27 ENCOUNTER — Other Ambulatory Visit: Payer: Self-pay | Admitting: *Deleted

## 2022-06-27 MED ORDER — METOPROLOL TARTRATE 50 MG PO TABS: 50.0000 mg | ORAL_TABLET | Freq: Two times a day (BID) | ORAL | 1 refills | Status: DC

## 2022-07-06 ENCOUNTER — Telehealth (HOSPITAL_BASED_OUTPATIENT_CLINIC_OR_DEPARTMENT_OTHER): Payer: Self-pay | Admitting: Cardiovascular Disease

## 2022-07-06 MED ORDER — CHLORTHALIDONE 25 MG PO TABS: 25.0000 mg | ORAL_TABLET | Freq: Every day | ORAL | 0 refills | Status: DC

## 2022-07-06 NOTE — Telephone Encounter (Signed)
*  STAT* If patient is at the pharmacy, call can be transferred to refill team.   1. Which medications need to be refilled? (please list name of each medication and dose if known)  chlorthalidone (HYGROTON) 25 MG tablet   2. Which pharmacy/location (including street and city if local pharmacy) is medication to be sent to? [facility] - [place], [place] - [address] [place]   3. Do they need a 30 day or 90 day supply? 90 day supply   Patient only has 4 tablets left.

## 2022-07-06 NOTE — Telephone Encounter (Signed)
Rx request sent to pharmacy.  

## 2022-08-21 ENCOUNTER — Other Ambulatory Visit (HOSPITAL_BASED_OUTPATIENT_CLINIC_OR_DEPARTMENT_OTHER): Payer: Self-pay | Admitting: Cardiovascular Disease

## 2022-08-23 ENCOUNTER — Ambulatory Visit (HOSPITAL_COMMUNITY): Payer: 59 | Admitting: Psychiatry

## 2022-09-03 ENCOUNTER — Other Ambulatory Visit (HOSPITAL_COMMUNITY): Payer: Self-pay | Admitting: Psychiatry

## 2022-09-04 ENCOUNTER — Other Ambulatory Visit: Payer: Self-pay | Admitting: Cardiovascular Disease

## 2022-09-04 NOTE — Telephone Encounter (Signed)
Rx request sent to pharmacy.  

## 2022-09-22 ENCOUNTER — Other Ambulatory Visit (HOSPITAL_BASED_OUTPATIENT_CLINIC_OR_DEPARTMENT_OTHER): Payer: Self-pay | Admitting: Cardiovascular Disease

## 2022-10-02 ENCOUNTER — Other Ambulatory Visit (HOSPITAL_COMMUNITY): Payer: Self-pay | Admitting: Psychiatry

## 2022-10-08 ENCOUNTER — Other Ambulatory Visit (HOSPITAL_BASED_OUTPATIENT_CLINIC_OR_DEPARTMENT_OTHER): Payer: Self-pay | Admitting: Cardiovascular Disease

## 2022-10-17 ENCOUNTER — Encounter (HOSPITAL_COMMUNITY): Payer: Self-pay | Admitting: Psychiatry

## 2022-10-17 ENCOUNTER — Ambulatory Visit (HOSPITAL_COMMUNITY): Payer: 59 | Admitting: Psychiatry

## 2022-10-17 ENCOUNTER — Other Ambulatory Visit: Payer: Self-pay

## 2022-10-17 VITALS — BP 152/91 | HR 69 | Ht 72.0 in | Wt 216.0 lb

## 2022-10-17 DIAGNOSIS — F200 Paranoid schizophrenia: Secondary | ICD-10-CM

## 2022-10-17 MED ORDER — THIORIDAZINE HCL 25 MG PO TABS: ORAL_TABLET | ORAL | 8 refills | Status: DC

## 2022-10-17 MED ORDER — BUSPIRONE HCL 5 MG PO TABS: 5.0000 mg | ORAL_TABLET | Freq: Two times a day (BID) | ORAL | 7 refills | Status: DC

## 2022-10-17 NOTE — Progress Notes (Signed)
Patient ID: Gilbert Harris., male   DOB: 16-Apr-1945, 77 y.o.   MRN: [id number] [facility] MD Progress Note  10/17/2022 2:31 PM Gilbert Harris.  MRN:  [id number] Subjective: Doing well Principal Problem: Schizophrenia residual Diagreat.  Schizophrenia in remission   Today the patient is seen in the office and is doing well.  Has normal and is dressed well.  He has an appointment with a routine visit for his cardiologist.  He has not seen a cardiologist in almost 2 years.  He is having no chest pain or shortness of breath.  His symptoms of follow-up.  Over a decade ago he had bypass surgery.  The patient denies depression or anxiety.  There is no evidence of psychosis.  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  He has a very supportive family.  Number of family members have had birthdays and he always sends him birthday cards.  He has a number of close friends.  He has had no romance.  He is sleeping and eating very well has good energy and takes his medicines as prescribed.   Past Surgical History:  Procedure Laterality Date   CARDIAC CATHETERIZATION     EF 50%--severe two vessel obstructive atherosclerotic coronary artery disease--Low normal left ventricle funcrtion   CORONARY ARTERY BYPASS GRAFT  2007   HERNIA REPAIR     TONSILLECTOMY AND ADENOIDECTOMY Bilateral y-59   Family History:  Family History  Problem Relation Age of Onset   Heart disease Mother    Heart attack Mother    Heart disease Father    Heart attack Father    Heart disease Maternal Grandmother    Stroke Maternal Grandfather    Heart failure Maternal Aunt    Family Psychiatric  History:  Social History:  Social History   Substance and Sexual Activity  Alcohol Use No   Alcohol/week: 0.0 standard drinks of alcohol     Social History   Substance and Sexual Activity  Drug Use No    Social History   Socioeconomic History   Marital status: Single    Spouse name: Not on file   Number of children: Not on file    Years of education: Not on file   Highest education level: Not on file  Occupational History   Not on file  Tobacco Use   Smoking status: Former    Current packs/day: 0.00    Types: Cigarettes    Quit date: 02/03/2006    Years since quitting: 16.7   Smokeless tobacco: Never  Vaping Use   Vaping status: Never Used  Substance and Sexual Activity   Alcohol use: No    Alcohol/week: 0.0 standard drinks of alcohol   Drug use: No   Sexual activity: Not Currently  Other Topics Concern   Not on file  Social History Narrative   Not on file   Social Determinants of Health   Financial Resource Strain: Not on file  Food Insecurity: Not on file  Transportation Needs: Not on file  Physical Activity: Not on file  Stress: Not on file  Social Connections: Not on file   Additional Social History:                         Sleep: Good  Appetite:  Good  Current Medications: Current Outpatient Medications  Medication Sig Dispense Refill   amLODipine (NORVASC) 5 MG tablet TAKE 1 TABLET BY MOUTH IN THE  MORNING AND AT  BEDTIME 200 tablet 3   Ascorbic Acid (VITAMIN C) 100 MG tablet Take 100 mg by mouth daily.     aspirin 81 MG EC tablet Take 81 mg by mouth daily. Swallow whole.     atorvastatin (LIPITOR) 20 MG tablet Take 1 tablet (20 mg total) by mouth daily. 90 tablet 1   busPIRone (BUSPAR) 5 MG tablet Take 1 tablet (5 mg total) by mouth 2 (two) times daily. 60 tablet 7   chlorthalidone (HYGROTON) 25 MG tablet Take 1 tablet (25 mg total) by mouth daily. Please keep your upcoming appointment for refills. 90 tablet 0   Cholecalciferol (VITAMIN D-3) 5000 units TABS Take 5,000 Units by mouth daily.     hydroxypropyl methylcellulose / hypromellose (ISOPTO TEARS / GONIOVISC) 2.5 % ophthalmic solution Place 1 drop into both eyes 4 (four) times daily as needed for dry eyes.     metoprolol tartrate (LOPRESSOR) 50 MG tablet Take 1 tablet (50 mg total) by mouth 2 (two) times daily. Please keep  your scheduled appointment for refills. 200 tablet 0   Multiple Vitamin (MULTIVITAMIN) tablet Take 1 tablet by mouth daily.       nitroGLYCERIN (NITROSTAT) 0.4 MG SL tablet DISSOLVE 1 TABLET UNDER THE TONGUE EVERY 5 MINUTES AS  NEEDED FOR CHEST PAIN UP TO 3 TABLETS IN 15 MINUTES,  CALL 911 IF PAIN PERSISTS 100 tablet 3   thioridazine (MELLARIL) 25 MG tablet 1  qhs 30 tablet 8   No current facility-administered medications for this visit.    Lab Results: No results found for this or any previous visit (from the past 48 hour(s)).  Physical Findings: AIMS:  , ,  ,  ,    CIWA:    COWS:     Musculoskeletal: Strength & Muscle Tone: within normal limits Gait & Station: normal Patient leans:  Psychiatric Specialty Exam: ROS  Blood pressure (!) 152/91, pulse 69, height 6' (1.829 m), weight 216 lb (98 kg).Body mass index is 29.29 kg/m.  General Appearance: Meticulous  Eye Contact::  Good  Speech:  Clear and Coherent  Volume:  Normal  Mood:  Euthymic  Affect:  Appropriate  Thought Process:  Coherent  Orientation:  Full (Time, Place, and Person)  Thought Content:  WDL  Suicidal Thoughts:  No  Homicidal Thoughts:  No  Memory:  NA  Judgement:  NA  Insight:  Good  Psychomotor Activity:  Normal  Concentration:  Good  Recall:  Good  Fund of Knowledge:Good  Language: Good  Akathisia:  No  Handed:  Right  AIMS (if indicated):     Assets:  Communication Skills  ADL's:  Intact  Cognition: WNL  Sleep:      Treatment Plan Summary: 10/17/2022, 2:31 PM     This patient's diagnosis is paranoid schizophrenia in remission.  He will continue taking Mellaril 25 mg.  He has no evidence of tardive dyskinesia.  He also takes BuSpar 5 mg twice daily.  Patient is doing very well.  He will return to see me in 6 or 7 months.

## 2022-10-19 ENCOUNTER — Ambulatory Visit (INDEPENDENT_AMBULATORY_CARE_PROVIDER_SITE_OTHER): Payer: 59 | Admitting: Cardiovascular Disease

## 2022-10-19 ENCOUNTER — Encounter (HOSPITAL_BASED_OUTPATIENT_CLINIC_OR_DEPARTMENT_OTHER): Payer: Self-pay | Admitting: Family

## 2022-10-19 ENCOUNTER — Telehealth (HOSPITAL_COMMUNITY): Payer: Self-pay | Admitting: *Deleted

## 2022-10-19 ENCOUNTER — Encounter (HOSPITAL_BASED_OUTPATIENT_CLINIC_OR_DEPARTMENT_OTHER): Payer: Self-pay | Admitting: Cardiovascular Disease

## 2022-10-19 VITALS — BP 160/90 | HR 63 | Ht 72.0 in | Wt 216.8 lb

## 2022-10-19 DIAGNOSIS — Z79899 Other long term (current) drug therapy: Secondary | ICD-10-CM

## 2022-10-19 DIAGNOSIS — I10 Essential (primary) hypertension: Secondary | ICD-10-CM

## 2022-10-19 DIAGNOSIS — R079 Chest pain, unspecified: Secondary | ICD-10-CM | POA: Diagnosis not present

## 2022-10-19 DIAGNOSIS — Z006 Encounter for examination for normal comparison and control in clinical research program: Secondary | ICD-10-CM

## 2022-10-19 DIAGNOSIS — R601 Generalized edema: Secondary | ICD-10-CM

## 2022-10-19 MED ORDER — FUROSEMIDE 40 MG PO TABS: 40.0000 mg | ORAL_TABLET | Freq: Every day | ORAL | 3 refills | Status: DC

## 2022-10-19 MED ORDER — CARVEDILOL 25 MG PO TABS: 25.0000 mg | ORAL_TABLET | Freq: Two times a day (BID) | ORAL | 3 refills | Status: DC

## 2022-10-19 NOTE — Progress Notes (Signed)
Cardiology Office Note:  .    Date:  10/19/2022  ID:  Gilbert Harris., DOB September 06, 1945, MRN [id number] PCP: Salli Real, MD  [facility] Providers Cardiologist:  None     History of Present Illness: .    Gilbert Harris. is a 77 y.o. male with hypertension, hyperlipidemia, CAD s/p MI and CABG in 2007, mild carotid stenosis, syncope, schizophrenia, who presents for follow up. In 06/2020 his BP was more elevated.  Amlodipine was increased and he was referred to our PharmD.  HCTZ was switched to chlorthalidone.  His home BP averaged 107/70, though his in office BP was in the 130s.  No changes were made at his 09/2020 appointment.    At his follow-up visit 06/2021, he was exercising routinely and generally feeling well. EKG at that visit showed new LBBB, but he was asymptomatic. Blood pressure was well controlled. He did complain of feeling more fatigued and taking more naps. CMP, TSH, lipids, and CBC were checked. Normal liver function and blood counts. Cholesterol was excellent. Kidney function was mildly abnormal but stable.   Today, he is feeling fine overall. However, in the past few months he has had randomly intermittent episodes of a dry cough in his upper throat. This usually lasts for 3-4 coughs or so, and is not a violent cough. Has occurred in the mornings and evenings. For treatment he has started using a cough syrup at least 2-3 times a week. He is concerned about developing bronchitis. In the office today his blood pressure is initially 162/96, and 160/90 on manual recheck. He confirms taking his medications this morning. He owns a blood pressure cuff but has not been monitoring lately. He complains of random left chest pain, associated with some shortness of breath that he describes as "making him more aware of breathing." He has used nitroglycerin about 4 times in the last few months. After taking it he has immediate improvement in his chest pain. If he doesn't take it, the chest pain  usually lasts about 5 minutes. He will sit still until the pain resolves spontaneously. Of note, he has been moving heavy objects often lately, more than 15 lbs. For exercise he walks sometimes. He also complains of a little bit of swelling in his legs, usually just above the sock lines when it occurs. In the last month he has noticed this swelling about 3 times. He denies orthopnea. Does have compression socks to his knee which he wears at times. He cooks his meals at home and hasn't used salt for the past month. He denies any palpitations, lightheadedness, headaches, syncope, orthopnea, or PND.  ROS:  Please see the history of present illness. All other systems are reviewed and negative.  (+) Dry cough (+) Left chest pain (+) Shortness of breath (+) LE edema  Studies Reviewed: Marland Kitchen       EKG 10/19/22: Sinus rhythm.  Rate 63 bpm.  LBBB.  PACs.   Risk Assessment/Calculations:     HYPERTENSION CONTROL Vitals:   10/19/22 0755 10/19/22 0802 10/19/22 0815  BP: (!) 162/94 (!) 162/96 (!) 160/90    The patient's blood pressure is elevated above target today.  In order to address the patient's elevated BP: A new medication was prescribed today.          Physical Exam:    VS:  BP (!) 160/90 (BP Location: Right Arm, Patient Position: Sitting, Cuff Size: Normal)   Pulse 63   Ht 6' (1.829 m)  Wt 216 lb 12.8 oz (98.3 kg)   BMI 29.40 kg/m  , BMI Body mass index is 29.4 kg/m. GENERAL:  Well appearing HEENT: Pupils equal round and reactive, fundi not visualized, oral mucosa unremarkable NECK:  + JVP to 4 cm above clavicle at 45 degrees.  +HJR.  Waveform within normal limits, carotid upstroke brisk and symmetric, no bruits, no thyromegaly LUNGS:  Clear to auscultation bilaterally HEART:  RRR.  PMI not displaced or sustained,S1 and S2 within normal limits, no S3, no S4, no clicks, no rubs, no murmurs ABD:  Flat, positive bowel sounds normal in frequency in pitch, no bruits, no rebound, no  guarding, no midline pulsatile mass, no hepatomegaly, no splenomegaly. No abdominal tenderness. EXT:  2 plus pulses throughout, 1+ LE edema bilaterally, no cyanosis no clubbing SKIN:  No rashes no nodules NEURO:  Cranial nerves II through XII grossly intact, motor grossly intact throughout PSYCH:  Cognitively intact, oriented to person place and time  Wt Readings from Last 3 Encounters:  10/19/22 216 lb 12.8 oz (98.3 kg)  07/15/21 201 lb 6.4 oz (91.4 kg)  10/19/20 205 lb (93 kg)     ASSESSMENT AND PLAN: .    # HF, type unknown:  Mr. Guimond has a chronic cough and LE edema.  I'm concerned for HF.  Dry cough for the past 3 months, more frequent in the morning and evening. No associated fever, sputum production, or other signs of infection. No history of allergies or acid reflux. Possible fluid overload contributing to cough. -Order echocardiogram to assess cardiac function and possible fluid overload. - Stop chlorthalidone.  Start lasix 40mg  daily.  BMP in 1 week  # Hypertension: Blood pressure control to be assessed at home. -Continue Amlodipine.  If no evidence of volume overload, will need to reduce/stop amlodipine as a possible contributor to his volume overload. -Enroll in remote patient monitoring study for better blood pressure control.  He consents to monitoring in the [employer] system. -Check blood pressure at home and report readings twice daily. -Switch Metoprolol to Carvedilol 25mg  twice daily for better blood pressure control.   # Angina: # CAD s/p CABG:  Occasional chest discomfort relieved by Nitroglycerin, occurring approximately once a month, often associated with physical exertion. History of bypass surgery. -Order nuclear stress test to assess for possible ischemia. -Switch Metoprolol to Carvedilol 25mg  twice daily for better blood pressure control. -Continue aspirin, atorvastatin, and amlodipine  # Hyperlipidemia: On Atorvastatin, due for cholesterol  check. -Continue Atorvastatin. -Order fasting lipid panel and CMP in one week.  Follow-up: -Return in 4 months for follow-up visit. -Interim follow-up in 2 months and 4 months as part of the remote patient monitoring study.         Disposition:   FU with APP/PharmD in 1 month. FU with Remedy Corporan C. Duke Salvia, MD, [facility] in 4 months.  I,Mathew Stumpf,acting as a Neurosurgeon for Chilton Si, MD.,have documented all relevant documentation on the behalf of Chilton Si, MD,as directed by  Chilton Si, MD while in the presence of Chilton Si, MD.  I, Jodeci Roarty C. Duke Salvia, MD have reviewed all documentation for this visit.  The documentation of the exam, diagnosis, procedures, and orders on 10/19/2022 are all accurate and complete.   Signed, Chilton Si, MD

## 2022-10-19 NOTE — Research (Signed)
  Subject Name: Gilbert Harris.  Gilbert Leber. met inclusion and exclusion criteria for the Virtual Care and Social Determinant Interventions for the management of hypertension trial.  The informed consent form, study requirements and expectations were reviewed with the subject by Dr. Duke Salvia and myself. The subject was given the opportunity to read the consent and ask questions. The subject verbalized understanding of the trial requirements.  All questions were addressed prior to the signing of the consent form. The subject agreed to participate in the trial and signed the informed consent. The informed consent was obtained prior to performance of any protocol-specific procedures for the subject.  A copy of the signed informed consent was given to the subject and a copy was placed in the subject's medical record.  Gilbert Leber. was randomized to Group 1.

## 2022-10-19 NOTE — Patient Instructions (Addendum)
Medication Instructions:  Your physician has recommended you make the following change in your medication:   Stop: Chlorthalidone   Stop: metoprolol   Start: Lasix 40mg  daily   Start: Carvedilol 25mg  twice daily    Labwork: Your physician recommends that you return for lab work in one week for fasting lipid panel and cmp    Testing/Procedures: Your Physician has requested you have a Myocardial Perfusion Imaging Study.   Please arrive 15 minutes prior to your appointment time for registration and insurance purposes.   The test will take approximately 3 to 4 hours to complete; you may bring reading material. If someone comes with you to your appointment, they will need to remain in the main lobby due to limited testing space in the testing area. **If you are pregnant or breastfeeding, please notify the nuclear lab prior to your appointment**   How to prepare for your test:  - Do not eat or drink 3 hours prior to your test, except you may have water  - Do not consume products containing caffeine ( regular or decaf) 12 hours prior to your test ( coffee, chocolate, sodas or teas)  - Do bring a list of your current medications with you. If not listed below, you may take your medications as normal (Hold beta blocker- 24 hour prior for exercise myoview)   - Do wear comfortable clothes (no dresses or overalls) and walking shoes ( tennis shoes preferred), no heel or open toe shoes are allowed - Do not wear cologne, perfume, aftershave, or lotions ( deodorant is allowed)  - If these instructions are not followed, your test will be rescheduled   If you cannot keep your appointment, please provide 24 hours notice to the Nuclear Lab, to avoid a possible $50 charge to your account!   Your physician has requested that you have an echocardiogram. Echocardiography is a painless test that uses sound waves to create images of your heart. It provides your doctor with information about the size and shape of  your heart and how well your heart's chambers and valves are working. This procedure takes approximately one hour. There are no restrictions for this procedure. Please do NOT wear cologne, perfume, aftershave, or lotions (deodorant is allowed). Please arrive 15 minutes prior to your appointment time.    Follow-Up: 8/29 WITH Gillian Shields, NP AT 1120   Special Instructions:  DASH Eating Plan DASH stands for Dietary Approaches to Stop Hypertension. The DASH eating plan is a healthy eating plan that has been shown to: Lower high blood pressure (hypertension). Reduce your risk for type 2 diabetes, heart disease, and stroke. Help with weight loss. What are tips for following this plan? Reading food labels Check food labels for the amount of salt (sodium) per serving. Choose foods with less than 5 percent of the Daily Value (DV) of sodium. In general, foods with less than 300 milligrams (mg) of sodium per serving fit into this eating plan. To find whole grains, look for the word "whole" as the first word in the ingredient list. Shopping Buy products labeled as "low-sodium" or "no salt added." Buy fresh foods. Avoid canned foods and pre-made or frozen meals. Cooking Try not to add salt when you cook. Use salt-free seasonings or herbs instead of table salt or sea salt. Check with your health care provider or pharmacist before using salt substitutes. Do not fry foods. Cook foods in healthy ways, such as baking, boiling, grilling, roasting, or broiling. Cook using oils that are  good for your heart. These include olive, canola, avocado, soybean, and sunflower oil. Meal planning  Eat a balanced diet. This should include: 4 or more servings of fruits and 4 or more servings of vegetables each day. Try to fill half of your plate with fruits and vegetables. 6-8 servings of whole grains each day. 6 or less servings of lean meat, poultry, or fish each day. 1 oz is 1 serving. A 3 oz (85 g) serving of  meat is about the same size as the palm of your hand. One egg is 1 oz (28 g). 2-3 servings of low-fat dairy each day. One serving is 1 cup (237 mL). 1 serving of nuts, seeds, or beans 5 times each week. 2-3 servings of heart-healthy fats. Healthy fats called omega-3 fatty acids are found in foods such as walnuts, flaxseeds, fortified milks, and eggs. These fats are also found in cold-water fish, such as sardines, salmon, and mackerel. Limit how much you eat of: Canned or prepackaged foods. Food that is high in trans fat, such as fried foods. Food that is high in saturated fat, such as fatty meat. Desserts and other sweets, sugary drinks, and other foods with added sugar. Full-fat dairy products. Do not salt foods before eating. Do not eat more than 4 egg yolks a week. Try to eat at least 2 vegetarian meals a week. Eat more home-cooked food and less restaurant, buffet, and fast food. Lifestyle When eating at a restaurant, ask if your food can be made with less salt or no salt. If you drink alcohol: Limit how much you have to: 0-1 drink a day if you are male. 0-2 drinks a day if you are male. Know how much alcohol is in your drink. In the [place] one drink is one 12 oz bottle of beer (355 mL), one 5 oz glass of wine (148 mL), or one 1 oz glass of hard liquor (44 mL). General information Avoid eating more than 2,300 mg of salt a day. If you have hypertension, you may need to reduce your sodium intake to 1,500 mg a day. Work with your provider to stay at a healthy body weight or lose weight. Ask what the best weight range is for you. On most days of the week, get at least 30 minutes of exercise that causes your heart to beat faster. This may include walking, swimming, or biking. Work with your provider or dietitian to adjust your eating plan to meet your specific calorie needs. What foods should I eat? Fruits All fresh, dried, or frozen fruit. Canned fruits that are in their natural juice  and do not have sugar added to them. Vegetables Fresh or frozen vegetables that are raw, steamed, roasted, or grilled. Low-sodium or reduced-sodium tomato and vegetable juice. Low-sodium or reduced-sodium tomato sauce and tomato paste. Low-sodium or reduced-sodium canned vegetables. Grains Whole-grain or whole-wheat bread. Whole-grain or whole-wheat pasta. Brown rice. Orpah Cobb. Bulgur. Whole-grain and low-sodium cereals. Pita bread. Low-fat, low-sodium crackers. Whole-wheat flour tortillas. Meats and other proteins Skinless chicken or [place]. Ground chicken or [place]. Pork with fat trimmed off. Fish and seafood. Egg whites. Dried beans, peas, or lentils. Unsalted nuts, nut butters, and seeds. Unsalted canned beans. Lean cuts of beef with fat trimmed off. Low-sodium, lean precooked or cured meat, such as sausages or meat loaves. Dairy Low-fat (1%) or fat-free (skim) milk. Reduced-fat, low-fat, or fat-free cheeses. Nonfat, low-sodium ricotta or cottage cheese. Low-fat or nonfat yogurt. Low-fat, low-sodium cheese. Fats and oils Soft margarine  without trans fats. Vegetable oil. Reduced-fat, low-fat, or light mayonnaise and salad dressings (reduced-sodium). Canola, safflower, olive, avocado, soybean, and sunflower oils. Avocado. Seasonings and condiments Herbs. Spices. Seasoning mixes without salt. Other foods Unsalted popcorn and pretzels. Fat-free sweets. The items listed above may not be all the foods and drinks you can have. Talk to a dietitian to learn more. What foods should I avoid? Fruits Canned fruit in a light or heavy syrup. Fried fruit. Fruit in cream or butter sauce. Vegetables Creamed or fried vegetables. Vegetables in a cheese sauce. Regular canned vegetables that are not marked as low-sodium or reduced-sodium. Regular canned tomato sauce and paste that are not marked as low-sodium or reduced-sodium. Regular tomato and vegetable juices that are not marked as low-sodium or  reduced-sodium. Rosita Fire. Olives. Grains Baked goods made with fat, such as croissants, muffins, or some breads. Dry pasta or rice meal packs. Meats and other proteins Fatty cuts of meat. Ribs. Fried meat. Tomasa Blase. Bologna, salami, and other precooked or cured meats, such as sausages or meat loaves, that are not lean and low in sodium. Fat from the back of a pig (fatback). Bratwurst. Salted nuts and seeds. Canned beans with added salt. Canned or smoked fish. Whole eggs or egg yolks. Chicken or [place] with skin. Dairy Whole or 2% milk, cream, and half-and-half. Whole or full-fat cream cheese. Whole-fat or sweetened yogurt. Full-fat cheese. Nondairy creamers. Whipped toppings. Processed cheese and cheese spreads. Fats and oils Butter. Stick margarine. Lard. Shortening. Ghee. Bacon fat. Tropical oils, such as coconut, palm kernel, or palm oil. Seasonings and condiments Onion salt, garlic salt, seasoned salt, table salt, and sea salt. Worcestershire sauce. Tartar sauce. Barbecue sauce. Teriyaki sauce. Soy sauce, including reduced-sodium soy sauce. Steak sauce. Canned and packaged gravies. Fish sauce. Oyster sauce. Cocktail sauce. Store-bought horseradish. Ketchup. Mustard. Meat flavorings and tenderizers. Bouillon cubes. Hot sauces. Pre-made or packaged marinades. Pre-made or packaged taco seasonings. Relishes. Regular salad dressings. Other foods Salted popcorn and pretzels. The items listed above may not be all the foods and drinks you should avoid. Talk to a dietitian to learn more. Where to find more information National Heart, Lung, and Blood Institute (NHLBI): BuffaloDryCleaner.gl American Heart Association (AHA): heart.org Academy of Nutrition and Dietetics: eatright.org National Kidney Foundation (NKF): kidney.org This information is not intended to replace advice given to you by your health care provider. Make sure you discuss any questions you have with your health care provider. Document Revised:  03/30/2022 Document Reviewed: 03/30/2022 Elsevier Patient Education  2024 [employer].

## 2022-10-19 NOTE — Telephone Encounter (Signed)
Patient given detailed instructions per Myocardial Perfusion Study Information Sheet for the test on 10/23/2022 at 10:00. Patient notified to arrive 15 minutes early and that it is imperative to arrive on time for appointment to keep from having the test rescheduled.  If you need to cancel or reschedule your appointment, please call the office within 24 hours of your appointment. . Patient verbalized understanding.Gilbert Harris

## 2022-10-23 ENCOUNTER — Ambulatory Visit (HOSPITAL_COMMUNITY): Payer: 59 | Attending: Cardiovascular Disease

## 2022-10-23 DIAGNOSIS — R601 Generalized edema: Secondary | ICD-10-CM

## 2022-10-23 DIAGNOSIS — Z79899 Other long term (current) drug therapy: Secondary | ICD-10-CM

## 2022-10-23 DIAGNOSIS — I10 Essential (primary) hypertension: Secondary | ICD-10-CM | POA: Diagnosis present

## 2022-10-23 DIAGNOSIS — R079 Chest pain, unspecified: Secondary | ICD-10-CM

## 2022-10-23 LAB — MYOCARDIAL PERFUSION IMAGING
LV dias vol: 110 mL (ref 62–150)
LV sys vol: 50 mL
Nuc Stress EF: 55 %
Peak HR: 89 {beats}/min
Rest HR: 72 {beats}/min
Rest Nuclear Isotope Dose: 10.9 mCi
SDS: 4
SRS: 4
SSS: 8
ST Depression (mm): 0 mm
Stress Nuclear Isotope Dose: 32.1 mCi
TID: 0.93

## 2022-10-23 MED ORDER — TECHNETIUM TC 99M TETROFOSMIN IV KIT
10.9000 | PACK | Freq: Once | INTRAVENOUS | Status: AC | PRN
  Administered 2022-10-23: 10.9 via INTRAVENOUS

## 2022-10-23 MED ORDER — REGADENOSON 0.4 MG/5ML IV SOLN: 0.4000 mg | Freq: Once | INTRAVENOUS | Status: DC

## 2022-10-23 MED ORDER — TECHNETIUM TC 99M TETROFOSMIN IV KIT: 32.1000 | PACK | Freq: Once | INTRAVENOUS | Status: DC | PRN

## 2022-11-06 ENCOUNTER — Telehealth: Payer: Self-pay | Admitting: Cardiovascular Disease

## 2022-11-06 NOTE — Telephone Encounter (Signed)
Returned call to patient, no answer, unable to leave VM.

## 2022-11-06 NOTE — Telephone Encounter (Signed)
Patient is returning call in regards to [facility] results.

## 2022-11-07 NOTE — Telephone Encounter (Signed)
2nd call attempt to patient, no answer, unable to leave VM

## 2022-11-09 NOTE — Telephone Encounter (Signed)
Returned call to patient, reviewed results with patient who verbalizes understanding.

## 2022-11-20 ENCOUNTER — Ambulatory Visit (INDEPENDENT_AMBULATORY_CARE_PROVIDER_SITE_OTHER): Payer: 59

## 2022-11-20 DIAGNOSIS — R601 Generalized edema: Secondary | ICD-10-CM

## 2022-11-20 DIAGNOSIS — I10 Essential (primary) hypertension: Secondary | ICD-10-CM

## 2022-11-20 DIAGNOSIS — R600 Localized edema: Secondary | ICD-10-CM | POA: Diagnosis not present

## 2022-11-20 DIAGNOSIS — Z79899 Other long term (current) drug therapy: Secondary | ICD-10-CM

## 2022-11-20 DIAGNOSIS — R079 Chest pain, unspecified: Secondary | ICD-10-CM | POA: Diagnosis not present

## 2022-11-20 LAB — ECHOCARDIOGRAM COMPLETE
AR max vel: 2.28 cm2
AV Area VTI: 2.25 cm2
AV Area mean vel: 2.09 cm2
AV Mean grad: 3 mmHg
AV Peak grad: 6.5 mmHg
AV Vena cont: 0.34 cm
Ao pk vel: 1.27 m/s
Area-P 1/2: 4.06 cm2
P 1/2 time: 648 msec
S' Lateral: 3.95 cm

## 2022-11-21 ENCOUNTER — Other Ambulatory Visit: Payer: Self-pay | Admitting: Cardiovascular Disease

## 2022-11-22 NOTE — Telephone Encounter (Signed)
Rx request sent to pharmacy.  

## 2022-11-23 ENCOUNTER — Ambulatory Visit (INDEPENDENT_AMBULATORY_CARE_PROVIDER_SITE_OTHER): Payer: 59 | Admitting: Family

## 2022-11-23 ENCOUNTER — Encounter (HOSPITAL_BASED_OUTPATIENT_CLINIC_OR_DEPARTMENT_OTHER): Payer: Self-pay | Admitting: Family

## 2022-11-23 VITALS — BP 140/78 | HR 74 | Ht 72.0 in | Wt 215.9 lb

## 2022-11-23 DIAGNOSIS — I25118 Atherosclerotic heart disease of native coronary artery with other forms of angina pectoris: Secondary | ICD-10-CM

## 2022-11-23 DIAGNOSIS — I10 Essential (primary) hypertension: Secondary | ICD-10-CM | POA: Diagnosis not present

## 2022-11-23 DIAGNOSIS — I5022 Chronic systolic (congestive) heart failure: Secondary | ICD-10-CM

## 2022-11-23 DIAGNOSIS — E785 Hyperlipidemia, unspecified: Secondary | ICD-10-CM | POA: Diagnosis not present

## 2022-11-23 NOTE — Patient Instructions (Addendum)
Medication Instructions:  Continue current medications.   We may consider adding a medication called Entresto to help strengthen your heart muscle based on your lab work.    Labwork: Your physician recommends that you return for lab work today: CMP, lipid panel   Testing/Procedures: Stress test looked good!  Echo showed your heart muscle function was mildly reduced. Normal is 50-65% and yours was 45-50%. This is called 'heart failure with mildly reduced ejection fraction'. Your Lasix, Coreg help strengthen your heart muscle function. Be sure to continue low salt diet, restrict to less than 64 oz (2 liters) of fluid per day, and stay active to help strengthen your heart muscle function.    Follow-Up: As scheduled   Special Instructions:  Keep up the great work checking blood pressure twice per day! Ideally check at least an hour after your medications.

## 2022-11-23 NOTE — Progress Notes (Signed)
Advanced Hypertension Clinic Assessment:    Date:  11/23/2022   ID:  Gilbert Harris., DOB 06-Apr-1945, MRN [id number]  PCP:  Salli Real, MD  Cardiologist:  None  Nephrologist:  Referring MD: Salli Real, MD   CC: Hypertension  History of Present Illness:    Gilbert Harris. is a 77 y.o. male with a hx of HFmrEF, hypertension, hyperlipidemia, CAD s/p MI and CABG 2007, mild carotid stenosis, schizophrenia here to follow up in the Advanced Hypertension Clinic.   06/2020 BP was elevated and amlodipine increased and referred to cardiology PharmD.  HCTZ transition to chlorthalidone.  At visit 06/2021 EKG with new LBBB but asymptomatic and BP controlled.  CMP, TSH, lipid, CBC only notable for mildly abnormal kidney function for stable from previous.  Last seen 10/19/2022 noting intermittent episodes of random dry cough with initial BP 162/96 and repeat 160/90.  Also noted sporadic left chest pain and some shortness of breath.  Echocardiogram revealed LVEF 45 to 60%, RV mildly enlarged, LA moderately dilated, RA mildly dilated, mild MR, mild to moderate TR, mild AI. Lexiscan Myoview with prior infarct but no ischemia.  Chlorthalidone stopped and started on Lasix 40 mg daily.  Metoprolol was changed to carvedilol for BP control.  Presents today for follow-up.  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 He has been feeling overall well. Note shis left ankle swells worse than his right side. Does notes some discomfort when he dorsiflexes his left foot. Does feel the swelling is improved since transition from Chlorthalidone to Furosemide. BP at home has been 120/80 - 161/96. Most recent home reading 136/85. No recurrent chest pain, dyspnea. Walking for exercises and started new chair yoga class at [facility].    Previous antihypertensives: Losartan/hydrochlorothiazide   Past Medical History:  Diagnosis Date   Carotid stenosis 01/05/2016   Coronary  artery disease    CABG x4 LIMA to LAD, SV graft to RCA, sequential saphenous vein graft to diagnal and OM1   Fatigue 07/15/2021   HTN (hypertension)    Hyperlipidemia    MI (myocardial infarction) (HCC)    Schizophrenia (HCC)     Past Surgical History:  Procedure Laterality Date   CARDIAC CATHETERIZATION     EF 50%--severe two vessel obstructive atherosclerotic coronary artery disease--Low normal left ventricle funcrtion   CORONARY ARTERY BYPASS GRAFT  2007   HERNIA REPAIR     TONSILLECTOMY AND ADENOIDECTOMY Bilateral y-59    Current Medications: Current Meds  Medication Sig   amLODipine (NORVASC) 5 MG tablet TAKE 1 TABLET BY MOUTH IN THE  MORNING AND AT BEDTIME   Ascorbic Acid (VITAMIN C) 100 MG tablet Take 100 mg by mouth daily.   aspirin 81 MG EC tablet Take 81 mg by mouth daily. Swallow whole.   atorvastatin (LIPITOR) 20 MG tablet Take 1 tablet (20 mg total) by mouth daily.   busPIRone (BUSPAR) 5 MG tablet Take 1 tablet (5 mg total) by mouth 2 (two) times daily.   carvedilol (COREG) 25 MG tablet Take 1 tablet (25 mg total) by mouth 2 (two) times daily.   Cholecalciferol (VITAMIN D-3) 5000 units TABS Take 5,000 Units by mouth daily.   furosemide (LASIX) 40 MG tablet Take 1 tablet (40 mg total) by mouth daily.   hydroxypropyl methylcellulose / hypromellose (ISOPTO TEARS / GONIOVISC) 2.5 % ophthalmic solution Place 1 drop into both eyes 4 (four) times daily as needed for dry eyes.  latanoprost (XALATAN) 0.005 % ophthalmic solution Place 1 drop into both eyes at bedtime.   Multiple Vitamin (MULTIVITAMIN) tablet Take 1 tablet by mouth daily.     nitroGLYCERIN (NITROSTAT) 0.4 MG SL tablet DISSOLVE 1 TABLET UNDER THE  TONGUE EVERY 5 MINUTES AS NEEDED FOR CHEST PAIN. MAX OF 3 TABLETS IN 15 MINUTES. CALL 911 IF PAIN  PERSISTS.   thioridazine (MELLARIL) 25 MG tablet 1  qhs     Allergies:   Haloperidol decanoate and Haloperidol lactate   Social History   Socioeconomic History    Marital status: Single    Spouse name: Not on file   Number of children: Not on file   Years of education: Not on file   Highest education level: Not on file  Occupational History   Not on file  Tobacco Use   Smoking status: Former    Current packs/day: 0.00    Types: Cigarettes    Quit date: 02/03/2006    Years since quitting: 16.8   Smokeless tobacco: Never  Vaping Use   Vaping status: Never Used  Substance and Sexual Activity   Alcohol use: No    Alcohol/week: 0.0 standard drinks of alcohol   Drug use: No   Sexual activity: Not Currently  Other Topics Concern   Not on file  Social History Narrative   Not on file   Social Determinants of Health   Financial Resource Strain: Not on file  Food Insecurity: Not on file  Transportation Needs: Not on file  Physical Activity: Not on file  Stress: Not on file  Social Connections: Not on file     Family History: The patient's family history includes Heart attack in his father and mother; Heart disease in his father, maternal grandmother, and mother; Heart failure in his maternal aunt; Stroke in his maternal grandfather.  ROS:   Please see the history of present illness.     All other systems reviewed and are negative.  EKGs/Labs/Other Studies Reviewed:         Recent Labs: No results found for requested labs within last 365 days.   Recent Lipid Panel    Component Value Date/Time   CHOL 145 07/15/2021 0853   TRIG 76 07/15/2021 0853   HDL 60 07/15/2021 0853   CHOLHDL 2.4 07/15/2021 0853   CHOLHDL 2.3 04/21/2016 1032   VLDL 17 04/21/2016 1032   LDLCALC 70 07/15/2021 0853    Physical Exam:   VS:  BP (!) 140/78   Pulse 74   Ht 6' (1.829 m)   Wt 215 lb 14.4 oz (97.9 kg)   SpO2 98%   BMI 29.28 kg/m  , BMI Body mass index is 29.28 kg/m. GENERAL:  Well appearing HEENT: Pupils equal round and reactive, fundi not visualized, oral mucosa unremarkable NECK:  No jugular venous distention, waveform within normal  limits, carotid upstroke brisk and symmetric, no bruits, no thyromegaly LYMPHATICS:  No cervical adenopathy LUNGS:  Clear to auscultation bilaterally HEART:  RRR.  PMI not displaced or sustained,S1 and S2 within normal limits, no S3, no S4, no clicks, no rubs, no murmurs ABD:  Flat, positive bowel sounds normal in frequency in pitch, no bruits, no rebound, no guarding, no midline pulsatile mass, no hepatomegaly, no splenomegaly EXT:  2 plus pulses throughout, no edema, no cyanosis no clubbing SKIN:  No rashes no nodules NEURO:  Cranial nerves II through XII grossly intact, motor grossly intact throughout PSYCH:  Cognitively intact, oriented to person place and time  ASSESSMENT/PLAN:    HFmrEF -lower extremity edema improved since transition from chlorthalidone to the Lasix.  Echo 11/20/2022 LVEF 45-50%.  Myoview 09/2022 with prior infarct but no new ischemia.  Present GDMT includes carvedilol, Lasix.  BP in clinic initially elevated 150/88 with repeat 140/78.  BP is better controlled at home with SBP 110s-130s. Update CMP today, if renal function stable plan to add Entresto.  CAD s/p CABG 2007 / HLD, LDL goal <70 -Myoview 09/2018 low risk study and no ischemia.  Stable with no anginal symptoms. No indication for ischemic evaluation.  GDMT aspirin, amlodipine, atorvastatin, carvedilol, as needed nitroglycerin.  Update CMP lipid panel. Recommend aiming for 150 minutes of moderate intensity activity per week and following a heart healthy diet.    HTN - BP not at goal <130/80. Update CMP and consider addition of Entresto, as above.   Screening for Secondary Hypertension:     Relevant Labs/Studies:    Latest Ref Rng & Units 07/15/2021    8:53 AM 10/14/2020    6:36 AM 06/29/2020   10:47 AM  Basic Labs  Sodium 134 - 144 mmol/L 141  131  139   Potassium 3.5 - 5.2 mmol/L 4.0  3.4  4.5   Creatinine 0.76 - 1.27 mg/dL 1.32  4.40  1.02        Latest Ref Rng & Units 07/15/2021    8:53 AM 06/05/2013    11:09 AM  Thyroid   TSH 0.450 - 4.500 uIU/mL 2.400  1.703                     he consents to be monitored in our remote patient monitoring program through [employer].  he will track his blood pressure twice daily and understands that these trends will help [place] to adjust his medications as needed prior to his next appointment.   Disposition:    FU with MD/PharmD in 1 month    Medication Adjustments/Labs and Tests Ordered: Current medicines are reviewed at length with the patient today.  Concerns regarding medicines are outlined above.  No orders of the defined types were placed in this encounter.  No orders of the defined types were placed in this encounter.    Signed, Alver Sorrow, NP  11/23/2022 6:52 PM    [facility]

## 2022-11-28 ENCOUNTER — Telehealth (HOSPITAL_BASED_OUTPATIENT_CLINIC_OR_DEPARTMENT_OTHER): Payer: Self-pay

## 2022-11-28 DIAGNOSIS — E785 Hyperlipidemia, unspecified: Secondary | ICD-10-CM

## 2022-11-28 DIAGNOSIS — I10 Essential (primary) hypertension: Secondary | ICD-10-CM

## 2022-11-28 DIAGNOSIS — I5022 Chronic systolic (congestive) heart failure: Secondary | ICD-10-CM

## 2022-11-28 NOTE — Telephone Encounter (Addendum)
Call attempted to patient, no answer, per DPR may not leave VM on any number.    ----- Message from Alver Sorrow sent at 11/24/2022  7:45 AM EDT ----- Stable kidney function. Normal electrolytes, liver. Cholesterol panel with  LDL of 83 which is above goal of <70. Recommend increase Atorvastatin to 40mg  daily. Repeat FLP/LFT in 2 months.  Given mildly reduced heart muscle function on echocardiogram as discussed in clinic yesterday, recommend addition of Entresto 24-26mg  BID with BMP in 1 week. This medication will help to control BP and strengthen heart muscle.

## 2022-11-29 NOTE — Telephone Encounter (Signed)
2nd call attempt to patient, call connected, introduced caller, call disconnected.    ----- Message from Alver Sorrow sent at 11/24/2022  7:45 AM EDT ----- Stable kidney function. Normal electrolytes, liver. Cholesterol panel with  LDL of 83 which is above goal of <70. Recommend increase Atorvastatin to 40mg  daily. Repeat FLP/LFT in 2 months.   Given mildly reduced heart muscle function on echocardiogram as discussed in clinic yesterday, recommend addition of Entresto 24-26mg  BID with BMP in 1 week. This medication will help to control BP and strengthen heart muscle.

## 2022-11-30 ENCOUNTER — Encounter (HOSPITAL_BASED_OUTPATIENT_CLINIC_OR_DEPARTMENT_OTHER): Payer: Self-pay

## 2022-11-30 NOTE — Telephone Encounter (Signed)
3rd call attempt unable to leave VM, results mailed to patient with instructions to call the office to have medication & labs ordered.    ----- Message from Alver Sorrow sent at 11/24/2022  7:45 AM EDT ----- Stable kidney function. Normal electrolytes, liver. Cholesterol panel with  LDL of 83 which is above goal of <70. Recommend increase Atorvastatin to 40mg  daily. Repeat FLP/LFT in 2 months.   Given mildly reduced heart muscle function on echocardiogram as discussed in clinic yesterday, recommend addition of Entresto 24-26mg  BID with BMP in 1 week. This medication will help to control BP and strengthen heart muscle.

## 2022-12-06 MED ORDER — ENTRESTO 24-26 MG PO TABS: 1.0000 | ORAL_TABLET | Freq: Two times a day (BID) | ORAL | 3 refills | Status: DC

## 2022-12-06 MED ORDER — ATORVASTATIN CALCIUM 40 MG PO TABS: 40.0000 mg | ORAL_TABLET | Freq: Every day | ORAL | 3 refills | Status: DC

## 2022-12-06 NOTE — Telephone Encounter (Signed)
Returned call to patient,   Patient received his results in the mail, reviewed them and patient is agreeable to changes. He is aware Sherryll Burger will likely need prior auth and that we will keep him udpated. Verbalizes understanding about med changes and labs to have drawn.   Rx to [facility] at patient request, labs ordered.     ----- Message from Alver Sorrow sent at 11/24/2022  7:45 AM EDT ----- Stable kidney function. Normal electrolytes, liver. Cholesterol panel with  LDL of 83 which is above goal of <70. Recommend increase Atorvastatin to 40mg  daily. Repeat FLP/LFT in 2 months.   Given mildly reduced heart muscle function on echocardiogram as discussed in clinic yesterday, recommend addition of Entresto 24-26mg  BID with BMP in 1 week. This medication will help to control BP and strengthen heart muscle.

## 2022-12-06 NOTE — Telephone Encounter (Signed)
Patient is returning call. Requesting return call.  At # :  [phone]

## 2022-12-06 NOTE — Addendum Note (Signed)
Addended by: Marlene Lard on: 12/06/2022 11:38 AM   Modules accepted: Orders

## 2022-12-28 ENCOUNTER — Encounter (HOSPITAL_BASED_OUTPATIENT_CLINIC_OR_DEPARTMENT_OTHER): Payer: Self-pay | Admitting: Family

## 2022-12-28 ENCOUNTER — Ambulatory Visit (INDEPENDENT_AMBULATORY_CARE_PROVIDER_SITE_OTHER): Payer: 59 | Admitting: Family

## 2022-12-28 VITALS — BP 118/74 | Ht 72.0 in | Wt 211.0 lb

## 2022-12-28 DIAGNOSIS — E785 Hyperlipidemia, unspecified: Secondary | ICD-10-CM | POA: Diagnosis not present

## 2022-12-28 DIAGNOSIS — I5022 Chronic systolic (congestive) heart failure: Secondary | ICD-10-CM | POA: Diagnosis not present

## 2022-12-28 DIAGNOSIS — I10 Essential (primary) hypertension: Secondary | ICD-10-CM | POA: Diagnosis not present

## 2022-12-28 DIAGNOSIS — I25118 Atherosclerotic heart disease of native coronary artery with other forms of angina pectoris: Secondary | ICD-10-CM | POA: Diagnosis not present

## 2022-12-28 NOTE — Progress Notes (Signed)
Advanced Hypertension Clinic Assessment:    Date:  12/28/2022   ID:  Gilbert Leber., DOB [birth year], MRN [id number]  PCP:  Salli Real, MD  Cardiologist:  None  Nephrologist:  Referring MD: Salli Real, MD   CC: Hypertension  History of Present Illness:    Gilbert Harris. is a 77 y.o. male with a hx of HFmrEF, hypertension, hyperlipidemia, CAD s/p MI and CABG 2007, mild carotid stenosis, schizophrenia here to follow up in the Advanced Hypertension Clinic.   06/2020 BP was elevated and amlodipine increased and referred to cardiology PharmD.  HCTZ transition to chlorthalidone.  At visit 06/2021 EKG with new LBBB but asymptomatic and BP controlled.  CMP, TSH, lipid, CBC only notable for mildly abnormal kidney function for stable from previous.  Seen 10/19/2022 noting intermittent episodes of random dry cough with initial BP 162/96 and repeat 160/90.  Also noted sporadic left chest pain and some shortness of breath.  Echocardiogram revealed LVEF 45 to 60%, RV mildly enlarged, LA moderately dilated, RA mildly dilated, mild MR, mild to moderate TR, mild AI. Lexiscan Myoview with prior infarct but no ischemia.  Chlorthalidone stopped and started on Lasix 40 mg daily.  Metoprolol was changed to carvedilol for BP control.  He was seen 11/23/22 and started on Entresto for elevated BP and HFmrEF. Atorvastatin was increased as LDL 83 not at goal <70.   Presents today for follow-up.  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  Walking for exercises and started new chair yoga class at [facility]. Has been monitoring BP at home sometimes before he takes morning medications, wide range from 110s-140s. Feels good on Entresto. Wearing compression stockings routinely and notes improved LE edema. Reports no shortness of breath nor dyspnea on exertion. Reports no chest pain, pressure, or tightness. No orthopnea, PND. Reports no palpitations.   Previous  antihypertensives: Losartan/hydrochlorothiazide Metoprolol - dwitched to carvedilol Chlorthalidone - switched to furosemide   Past Medical History:  Diagnosis Date   Carotid stenosis 01/05/2016   Coronary artery disease    CABG x4 LIMA to LAD, SV graft to RCA, sequential saphenous vein graft to diagnal and OM1   Fatigue 07/15/2021   HTN (hypertension)    Hyperlipidemia    MI (myocardial infarction) (HCC)    Schizophrenia (HCC)     Past Surgical History:  Procedure Laterality Date   CARDIAC CATHETERIZATION     EF 50%--severe two vessel obstructive atherosclerotic coronary artery disease--Low normal left ventricle funcrtion   CORONARY ARTERY BYPASS GRAFT  2007   HERNIA REPAIR     TONSILLECTOMY AND ADENOIDECTOMY Bilateral y-59    Current Medications: Current Meds  Medication Sig   amLODipine (NORVASC) 5 MG tablet TAKE 1 TABLET BY MOUTH IN THE  MORNING AND AT BEDTIME   Ascorbic Acid (VITAMIN C) 100 MG tablet Take 100 mg by mouth daily.   aspirin 81 MG EC tablet Take 81 mg by mouth daily. Swallow whole.   atorvastatin (LIPITOR) 40 MG tablet Take 1 tablet (40 mg total) by mouth daily.   busPIRone (BUSPAR) 5 MG tablet Take 1 tablet (5 mg total) by mouth 2 (two) times daily.   carvedilol (COREG) 25 MG tablet Take 1 tablet (25 mg total) by mouth 2 (two) times daily.   Cholecalciferol (VITAMIN D-3) 5000 units TABS Take 5,000 Units by mouth daily.   furosemide (LASIX) 40 MG tablet Take 1 tablet (40 mg total) by mouth daily.  hydroxypropyl methylcellulose / hypromellose (ISOPTO TEARS / GONIOVISC) 2.5 % ophthalmic solution Place 1 drop into both eyes 4 (four) times daily as needed for dry eyes.   latanoprost (XALATAN) 0.005 % ophthalmic solution Place 1 drop into both eyes at bedtime.   Multiple Vitamin (MULTIVITAMIN) tablet Take 1 tablet by mouth daily.     nitroGLYCERIN (NITROSTAT) 0.4 MG SL tablet DISSOLVE 1 TABLET UNDER THE  TONGUE EVERY 5 MINUTES AS NEEDED FOR CHEST PAIN. MAX OF 3  TABLETS IN 15 MINUTES. CALL 911 IF PAIN  PERSISTS.   sacubitril-valsartan (ENTRESTO) 24-26 MG Take 1 tablet by mouth 2 (two) times daily.   thioridazine (MELLARIL) 25 MG tablet 1  qhs     Allergies:   Haloperidol decanoate and Haloperidol lactate   Social History   Socioeconomic History   Marital status: Single    Spouse name: Not on file   Number of children: Not on file   Years of education: Not on file   Highest education level: Not on file  Occupational History   Not on file  Tobacco Use   Smoking status: Former    Current packs/day: 0.00    Types: Cigarettes    Quit date: 02/03/2006    Years since quitting: 16.9   Smokeless tobacco: Never  Vaping Use   Vaping status: Never Used  Substance and Sexual Activity   Alcohol use: No    Alcohol/week: 0.0 standard drinks of alcohol   Drug use: No   Sexual activity: Not Currently  Other Topics Concern   Not on file  Social History Narrative   Not on file   Social Determinants of Health   Financial Resource Strain: Not on file  Food Insecurity: Not on file  Transportation Needs: Not on file  Physical Activity: Not on file  Stress: Not on file  Social Connections: Not on file     Family History: The patient's family history includes Heart attack in his father and mother; Heart disease in his father, maternal grandmother, and mother; Heart failure in his maternal aunt; Stroke in his maternal grandfather.  ROS:   Please see the history of present illness.     All other systems reviewed and are negative.  EKGs/Labs/Other Studies Reviewed:         Recent Labs: 11/23/2022: ALT 28; BUN 13; Creatinine, Ser 1.34; Potassium 4.5; Sodium 139   Recent Lipid Panel    Component Value Date/Time   CHOL 161 11/23/2022 1328   TRIG 55 11/23/2022 1328   HDL 67 11/23/2022 1328   CHOLHDL 2.4 11/23/2022 1328   CHOLHDL 2.3 04/21/2016 1032   VLDL 17 04/21/2016 1032   LDLCALC 83 11/23/2022 1328    Physical Exam:   VS:  BP  118/74 (BP Location: Right Arm, Patient Position: Sitting, Cuff Size: Normal)   Ht 6' (1.829 m)   Wt 211 lb (95.7 kg)   SpO2 98%   BMI 28.62 kg/m  , BMI Body mass index is 28.62 kg/m. GENERAL:  Well appearing HEENT: Pupils equal round and reactive, fundi not visualized, oral mucosa unremarkable NECK:  No jugular venous distention, waveform within normal limits, carotid upstroke brisk and symmetric, no bruits, no thyromegaly LYMPHATICS:  No cervical adenopathy LUNGS:  Clear to auscultation bilaterally HEART:  RRR.  PMI not displaced or sustained,S1 and S2 within normal limits, no S3, no S4, no clicks, no rubs, no murmurs ABD:  Flat, positive bowel sounds normal in frequency in pitch, no bruits, no rebound, no guarding,  no midline pulsatile mass, no hepatomegaly, no splenomegaly EXT:  2 plus pulses throughout, no edema, no cyanosis no clubbing SKIN:  No rashes no nodules NEURO:  Cranial nerves II through XII grossly intact, motor grossly intact throughout PSYCH:  Cognitively intact, oriented to person place and time   ASSESSMENT/PLAN:    HFmrEF - Echo 11/20/2022 LVEF 45-50%.  Myoview 09/2022 with prior infarct but no new ischemia.  Present GDMT includes carvedilol, Lasix, Entresto. NYHA I - Will continue on current regimen. If heart failure symptoms noted in future consider MRA/SGLT2i defer today as asymptomatic and relatively hypotensive. Low sodium diet, fluid restriction <2L, and daily weights encouraged. Educated to contact our office for weight gain of 2 lbs overnight or 5 lbs in one week.   CAD s/p CABG 2007 / HLD, LDL goal <70 - Myoview 09/2022 low risk study and no ischemia. GDMT aspirin, amlodipine, atorvastatin, carvedilol, as needed nitroglycerin. Notes no chest pain or shortness of breath. Continue current regimen. Recommend aiming for 150 minutes of moderate intensity activity per week and following a heart healthy diet.    HTN - Blood pressure well controlled during the visit.  Encouraged to monitor BP at home after medications and to bring log to next office visit. Continue current antihypertensive regimen. Will complete [employer] study at next office visit.   Screening for Secondary Hypertension:     Relevant Labs/Studies:    Latest Ref Rng & Units 11/23/2022    1:28 PM 07/15/2021    8:53 AM 10/14/2020    6:36 AM  Basic Labs  Sodium 134 - 144 mmol/L 139  141  131   Potassium 3.5 - 5.2 mmol/L 4.5  4.0  3.4   Creatinine 0.76 - 1.27 mg/dL 3.47  4.25  9.56        Latest Ref Rng & Units 07/15/2021    8:53 AM 06/05/2013   11:09 AM  Thyroid   TSH 0.450 - 4.500 uIU/mL 2.400  1.703                     he consents to be monitored in our remote patient monitoring program through [employer].  he will track his blood pressure twice daily and understands that these trends will help [place] to adjust his medications as needed prior to his next appointment.   Disposition:    FU with MD/PharmD in 1 month    Medication Adjustments/Labs and Tests Ordered: Current medicines are reviewed at length with the patient today.  Concerns regarding medicines are outlined above.  Orders Placed This Encounter  Procedures   Comprehensive metabolic panel   LDL cholesterol, direct   No orders of the defined types were placed in this encounter.    Signed, Alver Sorrow, NP  12/28/2022 4:49 PM    [facility]

## 2022-12-28 NOTE — Patient Instructions (Addendum)
Medication Instructions:  Continue your current medications.   Labwork: Your physician recommends that you return for lab work today: CMP, direct LDL    Follow-Up: Follow up as schedule   Special Instructions:  Continue checking BP once or twice per day and bring BP log to next office visit Bring BP cuff to next office visit

## 2022-12-29 ENCOUNTER — Telehealth (HOSPITAL_BASED_OUTPATIENT_CLINIC_OR_DEPARTMENT_OTHER): Payer: Self-pay

## 2022-12-29 DIAGNOSIS — I10 Essential (primary) hypertension: Secondary | ICD-10-CM

## 2022-12-29 LAB — COMPREHENSIVE METABOLIC PANEL
ALT: 26 [IU]/L (ref 0–44)
AST: 23 [IU]/L (ref 0–40)
Albumin: 4.5 g/dL (ref 3.8–4.8)
Alkaline Phosphatase: 63 [IU]/L (ref 44–121)
BUN/Creatinine Ratio: 13 (ref 10–24)
BUN: 21 mg/dL (ref 8–27)
Bilirubin Total: 0.4 mg/dL (ref 0.0–1.2)
CO2: 26 mmol/L (ref 20–29)
Calcium: 9.5 mg/dL (ref 8.6–10.2)
Chloride: 103 mmol/L (ref 96–106)
Creatinine, Ser: 1.63 mg/dL — ABNORMAL HIGH (ref 0.76–1.27)
Globulin, Total: 2.1 g/dL (ref 1.5–4.5)
Glucose: 105 mg/dL — ABNORMAL HIGH (ref 70–99)
Potassium: 4.3 mmol/L (ref 3.5–5.2)
Sodium: 142 mmol/L (ref 134–144)
Total Protein: 6.6 g/dL (ref 6.0–8.5)
eGFR: 43 mL/min/{1.73_m2} — ABNORMAL LOW (ref >59)

## 2022-12-29 LAB — LDL CHOLESTEROL, DIRECT: LDL Direct: 67 mg/dL (ref 0–99)

## 2022-12-29 MED ORDER — FUROSEMIDE 20 MG PO TABS: 20.0000 mg | ORAL_TABLET | Freq: Every day | ORAL | 3 refills | Status: AC

## 2022-12-29 NOTE — Telephone Encounter (Addendum)
Results called to patient who verbalizes understanding!  Labs ordered and rx updated with preferred pharmacy.   ----- Message from Alver Sorrow sent at 12/29/2022  7:50 AM EDT ----- Direct LDL at goal of less than 70.  Normal liver, electrolytes.  Kidney function slightly decreased from prior likely related to addition of Entresto with Furosemide.  Recommend continue Entresto and reduce Lasix to 20 mg daily.  Repeat BMP in 2 weeks.

## 2023-02-01 ENCOUNTER — Ambulatory Visit (HOSPITAL_BASED_OUTPATIENT_CLINIC_OR_DEPARTMENT_OTHER): Payer: 59 | Admitting: Family

## 2023-02-01 ENCOUNTER — Encounter (HOSPITAL_BASED_OUTPATIENT_CLINIC_OR_DEPARTMENT_OTHER): Payer: Self-pay | Admitting: Family

## 2023-02-01 VITALS — BP 118/82 | HR 64 | Ht 72.0 in | Wt 216.4 lb

## 2023-02-01 DIAGNOSIS — I5022 Chronic systolic (congestive) heart failure: Secondary | ICD-10-CM

## 2023-02-01 DIAGNOSIS — I25118 Atherosclerotic heart disease of native coronary artery with other forms of angina pectoris: Secondary | ICD-10-CM | POA: Diagnosis not present

## 2023-02-01 DIAGNOSIS — E785 Hyperlipidemia, unspecified: Secondary | ICD-10-CM | POA: Diagnosis not present

## 2023-02-01 DIAGNOSIS — I10 Essential (primary) hypertension: Secondary | ICD-10-CM | POA: Diagnosis not present

## 2023-02-01 NOTE — Progress Notes (Signed)
Advanced Hypertension Clinic Assessment:    Date:  02/01/2023   ID:  Gilbert Leber., DOB December 19, 1945, MRN [id number]  PCP:  Fleet Contras, MD  Cardiologist:  Chilton Si, MD  Nephrologist:  Referring MD: Salli Real, MD   CC: Hypertension  History of Present Illness:    Gilbert Brodhead. is a 77 y.o. male with a hx of HFmrEF, hypertension, hyperlipidemia, CAD s/p MI and CABG 2007, mild carotid stenosis, schizophrenia here to follow up in the Advanced Hypertension Clinic.   06/2020 BP was elevated and amlodipine increased and referred to cardiology PharmD.  HCTZ transition to chlorthalidone.  At visit 06/2021 EKG with new LBBB but asymptomatic and BP controlled.  CMP, TSH, lipid, CBC only notable for mildly abnormal kidney function for stable from previous.  Seen 10/19/2022 noting intermittent episodes of random dry cough with initial BP 162/96 and repeat 160/90.  Also noted sporadic left chest pain and some shortness of breath.  Echocardiogram revealed LVEF 45 to 60%, RV mildly enlarged, LA moderately dilated, RA mildly dilated, mild MR, mild to moderate TR, mild AI. Lexiscan Myoview with prior infarct but no ischemia.  Chlorthalidone stopped and started on Lasix 40 mg daily.  Metoprolol was changed to carvedilol for BP control.  He was seen 11/23/22 and started on Entresto for elevated BP and HFmrEF. Atorvastatin was increased as LDL 83 not at goal <70.   Last seen 12/28/22 with BP at goal. Creatinine slightly increased, Lasix reduced to 20mg  daily.   Presents today for follow-up.  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  Walking for exercises and started new chair yoga class at [facility]. Checking BP at home before taking medications, running mostly 120s but some up to 150. Notes weight gain of 5 pounds but attributes this to not exercising as much as he did. Continuing to wear compression stockings and notes no LE edema. Reports  no shortness of breath nor dyspnea on exertion. Reports no chest pain, pressure, or tightness. No edema, orthopnea, PND. Reports no palpitations.   Previous antihypertensives: Losartan/hydrochlorothiazide Metoprolol - dwitched to carvedilol Chlorthalidone - switched to furosemide   Past Medical History:  Diagnosis Date   Carotid stenosis 01/05/2016   Coronary artery disease    CABG x4 LIMA to LAD, SV graft to RCA, sequential saphenous vein graft to diagnal and OM1   Fatigue 07/15/2021   HTN (hypertension)    Hyperlipidemia    MI (myocardial infarction) (HCC)    Schizophrenia (HCC)     Past Surgical History:  Procedure Laterality Date   CARDIAC CATHETERIZATION     EF 50%--severe two vessel obstructive atherosclerotic coronary artery disease--Low normal left ventricle funcrtion   CORONARY ARTERY BYPASS GRAFT  2007   HERNIA REPAIR     TONSILLECTOMY AND ADENOIDECTOMY Bilateral y-59    Current Medications: Current Meds  Medication Sig   amLODipine (NORVASC) 5 MG tablet TAKE 1 TABLET BY MOUTH IN THE  MORNING AND AT BEDTIME   Ascorbic Acid (VITAMIN C) 100 MG tablet Take 100 mg by mouth daily.   aspirin 81 MG EC tablet Take 81 mg by mouth daily. Swallow whole.   atorvastatin (LIPITOR) 40 MG tablet Take 1 tablet (40 mg total) by mouth daily.   busPIRone (BUSPAR) 5 MG tablet Take 1 tablet (5 mg total) by mouth 2 (two) times daily.   Cholecalciferol (VITAMIN D-3) 5000 units TABS Take 5,000 Units by mouth daily.  furosemide (LASIX) 20 MG tablet Take 1 tablet (20 mg total) by mouth daily.   hydroxypropyl methylcellulose / hypromellose (ISOPTO TEARS / GONIOVISC) 2.5 % ophthalmic solution Place 1 drop into both eyes 4 (four) times daily as needed for dry eyes.   latanoprost (XALATAN) 0.005 % ophthalmic solution Place 1 drop into both eyes at bedtime.   Multiple Vitamin (MULTIVITAMIN) tablet Take 1 tablet by mouth daily.     nitroGLYCERIN (NITROSTAT) 0.4 MG SL tablet DISSOLVE 1 TABLET UNDER  THE  TONGUE EVERY 5 MINUTES AS NEEDED FOR CHEST PAIN. MAX OF 3 TABLETS IN 15 MINUTES. CALL 911 IF PAIN  PERSISTS.   sacubitril-valsartan (ENTRESTO) 24-26 MG Take 1 tablet by mouth 2 (two) times daily.   thioridazine (MELLARIL) 25 MG tablet 1  qhs     Allergies:   Haloperidol decanoate and Haloperidol lactate   Social History   Socioeconomic History   Marital status: Single    Spouse name: Not on file   Number of children: Not on file   Years of education: Not on file   Highest education level: Not on file  Occupational History   Not on file  Tobacco Use   Smoking status: Former    Current packs/day: 0.00    Types: Cigarettes    Quit date: 02/03/2006    Years since quitting: 17.0   Smokeless tobacco: Never  Vaping Use   Vaping status: Never Used  Substance and Sexual Activity   Alcohol use: No    Alcohol/week: 0.0 standard drinks of alcohol   Drug use: No   Sexual activity: Not Currently  Other Topics Concern   Not on file  Social History Narrative   Not on file   Social Determinants of Health   Financial Resource Strain: Not on file  Food Insecurity: Not on file  Transportation Needs: Not on file  Physical Activity: Not on file  Stress: Not on file  Social Connections: Not on file     Family History: The patient's family history includes Heart attack in his father and mother; Heart disease in his father, maternal grandmother, and mother; Heart failure in his maternal aunt; Stroke in his maternal grandfather.  ROS:   Please see the history of present illness.     All other systems reviewed and are negative.  EKGs/Labs/Other Studies Reviewed:         Recent Labs: 12/28/2022: ALT 26; BUN 21; Creatinine, Ser 1.63; Potassium 4.3; Sodium 142   Recent Lipid Panel    Component Value Date/Time   CHOL 161 11/23/2022 1328   TRIG 55 11/23/2022 1328   HDL 67 11/23/2022 1328   CHOLHDL 2.4 11/23/2022 1328   CHOLHDL 2.3 04/21/2016 1032   VLDL 17 04/21/2016 1032    LDLCALC 83 11/23/2022 1328   LDLDIRECT 67 12/28/2022 1507    Physical Exam:   VS:  BP 118/82   Pulse 64   Ht 6' (1.829 m)   Wt 216 lb 6.4 oz (98.2 kg)   SpO2 95%   BMI 29.35 kg/m  , BMI Body mass index is 29.35 kg/m. GENERAL:  Well appearing HEENT: Pupils equal round and reactive, fundi not visualized, oral mucosa unremarkable NECK:  No jugular venous distention, waveform within normal limits, carotid upstroke brisk and symmetric, no bruits, no thyromegaly LYMPHATICS:  No cervical adenopathy LUNGS:  Clear to auscultation bilaterally HEART:  RRR.  PMI not displaced or sustained,S1 and S2 within normal limits, no S3, no S4, no clicks, no rubs, no  murmurs ABD:  Flat, positive bowel sounds normal in frequency in pitch, no bruits, no rebound, no guarding, no midline pulsatile mass, no hepatomegaly, no splenomegaly EXT:  2 plus pulses throughout, no edema, no cyanosis no clubbing SKIN:  No rashes no nodules NEURO:  Cranial nerves II through XII grossly intact, motor grossly intact throughout PSYCH:  Cognitively intact, oriented to person place and time   ASSESSMENT/PLAN:    HFmrEF - Echo 11/20/2022 LVEF 45-50%.  Myoview 09/2022 with prior infarct but no new ischemia.  Present GDMT includes carvedilol, Lasix, Entresto. NYHA I - Will continue on current regimen. If heart failure symptoms noted in future consider MRA/SGLT2i defer today as asymptomatic and relatively hypotensive. Low sodium diet, fluid restriction <2L, and daily weights encouraged. Educated to contact our office for weight gain of 2 lbs overnight or 5 lbs in one week.   CAD s/p CABG 2007 / HLD, LDL goal <70 - Myoview 09/2022 low risk study and no ischemia. GDMT aspirin, amlodipine, atorvastatin, carvedilol, as needed nitroglycerin. Notes no chest pain or shortness of breath. Continue current regimen. Recommend aiming for 150 minutes of moderate intensity activity per week and following a heart healthy diet.  HTN - BP at goal <  130/80. Continue current regimen. Will recheck BMP. If creatinine still elevated, consider changing Lasix to as needed. Discussed to monitor BP at home at least 2 hours after medications and sitting for 5-10 minutes. Complete participation in research study - appreciate his participation.  Screening for Secondary Hypertension:     Relevant Labs/Studies:    Latest Ref Rng & Units 12/28/2022    3:07 PM 11/23/2022    1:28 PM 07/15/2021    8:53 AM  Basic Labs  Sodium 134 - 144 mmol/L 142  139  141   Potassium 3.5 - 5.2 mmol/L 4.3  4.5  4.0   Creatinine 0.76 - 1.27 mg/dL 1.61  0.96  0.45        Latest Ref Rng & Units 07/15/2021    8:53 AM 06/05/2013   11:09 AM  Thyroid   TSH 0.450 - 4.500 uIU/mL 2.400  1.703                       Disposition:    FU with MD/PharmD/APP in 6 months.   Medication Adjustments/Labs and Tests Ordered: Current medicines are reviewed at length with the patient today.  Concerns regarding medicines are outlined above.  No orders of the defined types were placed in this encounter.  No orders of the defined types were placed in this encounter.    Signed, Alver Sorrow, NP  02/01/2023 1:14 PM    [facility]

## 2023-02-01 NOTE — Patient Instructions (Signed)
Medication Instructions:  Your physician recommends that you continue on your current medications as directed. Please refer to the Current Medication list given to you today.  *If you need a refill on your cardiac medications before your next appointment, please call your pharmacy*  Follow-Up: At [facility], you and your health needs are our priority.  As part of our continuing mission to provide you with exceptional heart care, we have created designated Provider Care Teams.  These Care Teams include your primary Cardiologist (physician) and Advanced Practice Providers (APPs -  Physician Assistants and Nurse Practitioners) who all work together to provide you with the care you need, when you need it.  We recommend signing up for the patient portal called "MyChart".  Sign up information is provided on this After Visit Summary.  MyChart is used to connect with patients for Virtual Visits (Telemedicine).  Patients are able to view lab/test results, encounter notes, upcoming appointments, etc.  Non-urgent messages can be sent to your provider as well.   To learn more about what you can do with MyChart, go to [email or web address].    Your next appointment:   6 month(s) HTN CLINIC  Provider:   Gillian Shields, NP   Other Instructions Please return blood pressure machine to the office at your earliest convenience.

## 2023-05-06 ENCOUNTER — Other Ambulatory Visit (HOSPITAL_COMMUNITY): Payer: Self-pay | Admitting: Psychiatry

## 2023-05-23 ENCOUNTER — Encounter (HOSPITAL_COMMUNITY): Payer: Self-pay | Admitting: Psychiatry

## 2023-05-23 ENCOUNTER — Other Ambulatory Visit: Payer: Self-pay

## 2023-05-23 ENCOUNTER — Ambulatory Visit (HOSPITAL_BASED_OUTPATIENT_CLINIC_OR_DEPARTMENT_OTHER): Payer: 59 | Admitting: Psychiatry

## 2023-05-23 VITALS — BP 95/63 | HR 111 | Ht 72.0 in | Wt 213.0 lb

## 2023-05-23 DIAGNOSIS — F200 Paranoid schizophrenia: Secondary | ICD-10-CM | POA: Diagnosis not present

## 2023-05-23 MED ORDER — THIORIDAZINE HCL 25 MG PO TABS: ORAL_TABLET | ORAL | 3 refills | Status: DC

## 2023-05-23 MED ORDER — BUSPIRONE HCL 5 MG PO TABS: 5.0000 mg | ORAL_TABLET | Freq: Two times a day (BID) | ORAL | 3 refills | Status: DC

## 2023-05-23 NOTE — Progress Notes (Signed)
 Patient ID: Gilbert Leber., male   DOB: 06-15-1945, 78 y.o.   MRN: [id number] [facility] MD Progress Note  05/23/2023 1:35 PM Gilbert Leber.  MRN:  [id number] Subjective: Doing well Principal Problem: Schizophrenia residual Diagreat.  Schizophrenia in remission    Today the patient is seen in the office.  The patient is doing very well.  He is at his baseline.  He is well-dressed.  He is articulate and intelligent thoughts are clear and organized.  He denies any depression symptoms.  He has no evidence of psychosis.  He is sleeping and eating well has good energy.  He has a positive sense of self worth.  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  He lives as independent as possible.  He likes where he lives.  In the last few months he saw his cardiologist and got a good bill of health.   Past Surgical History:  Procedure Laterality Date   CARDIAC CATHETERIZATION     EF 50%--severe two vessel obstructive atherosclerotic coronary artery disease--Low normal left ventricle funcrtion   CORONARY ARTERY BYPASS GRAFT  2007   HERNIA REPAIR     TONSILLECTOMY AND ADENOIDECTOMY Bilateral y-59   Family History:  Family History  Problem Relation Age of Onset   Heart disease Mother    Heart attack Mother    Heart disease Father    Heart attack Father    Heart disease Maternal Grandmother    Stroke Maternal Grandfather    Heart failure Maternal Aunt    Family Psychiatric  History:  Social History:  Social History   Substance and Sexual Activity  Alcohol Use No   Alcohol/week: 0.0 standard drinks of alcohol     Social History   Substance and Sexual Activity  Drug Use No    Social History   Socioeconomic History   Marital status: Single    Spouse name: Not on file   Number of children: Not on file   Years of education: Not on file   Highest education level: Not on file  Occupational History   Not on file  Tobacco Use   Smoking status: Former    Current packs/day: 0.00    Types:  Cigarettes    Quit date: 02/03/2006    Years since quitting: 17.3   Smokeless tobacco: Never  Vaping Use   Vaping status: Never Used  Substance and Sexual Activity   Alcohol use: No    Alcohol/week: 0.0 standard drinks of alcohol   Drug use: No   Sexual activity: Not Currently  Other Topics Concern   Not on file  Social History Narrative   Not on file   Social Drivers of Health   Financial Resource Strain: Not on file  Food Insecurity: Not on file  Transportation Needs: Not on file  Physical Activity: Not on file  Stress: Not on file  Social Connections: Not on file   Additional Social History:                         Sleep: Good  Appetite:  Good  Current Medications: Current Outpatient Medications  Medication Sig Dispense Refill   amLODipine (NORVASC) 5 MG tablet TAKE 1 TABLET BY MOUTH IN THE  MORNING AND AT BEDTIME 200 tablet 3   Ascorbic Acid (VITAMIN C) 100 MG tablet Take 100 mg by mouth daily.     aspirin 81 MG EC tablet Take 81 mg by mouth daily. Swallow  whole.     atorvastatin (LIPITOR) 40 MG tablet Take 1 tablet (40 mg total) by mouth daily. 90 tablet 3   carvedilol (COREG) 25 MG tablet Take 1 tablet (25 mg total) by mouth 2 (two) times daily. 180 tablet 3   Cholecalciferol (VITAMIN D-3) 5000 units TABS Take 5,000 Units by mouth daily.     furosemide (LASIX) 20 MG tablet Take 1 tablet (20 mg total) by mouth daily. 90 tablet 3   hydroxypropyl methylcellulose / hypromellose (ISOPTO TEARS / GONIOVISC) 2.5 % ophthalmic solution Place 1 drop into both eyes 4 (four) times daily as needed for dry eyes.     latanoprost (XALATAN) 0.005 % ophthalmic solution Place 1 drop into both eyes at bedtime.     Multiple Vitamin (MULTIVITAMIN) tablet Take 1 tablet by mouth daily.       nitroGLYCERIN (NITROSTAT) 0.4 MG SL tablet DISSOLVE 1 TABLET UNDER THE  TONGUE EVERY 5 MINUTES AS NEEDED FOR CHEST PAIN. MAX OF 3 TABLETS IN 15 MINUTES. CALL 911 IF PAIN  PERSISTS. 125 tablet  2   sacubitril-valsartan (ENTRESTO) 24-26 MG Take 1 tablet by mouth 2 (two) times daily. 180 tablet 3   busPIRone (BUSPAR) 5 MG tablet Take 1 tablet (5 mg total) by mouth 2 (two) times daily. 180 tablet 3   thioridazine (MELLARIL) 25 MG tablet 1  qhs 90 tablet 3   No current facility-administered medications for this visit.   Facility-Administered Medications Ordered in Other Visits  Medication Dose Route Frequency Provider Last Rate Last Admin   regadenoson (LEXISCAN) injection SOLN 0.4 mg  0.4 mg Intravenous Once Croitoru, Mihai, MD       technetium tetrofosmin (TC-MYOVIEW) injection 32.1 millicurie  32.1 millicurie Intravenous Once PRN Croitoru, Mihai, MD        Lab Results: No results found for this or any previous visit (from the past 48 hours).  Physical Findings: AIMS:  , ,  ,  ,    CIWA:    COWS:     Musculoskeletal: Strength & Muscle Tone: within normal limits Gait & Station: normal Patient leans:  Psychiatric Specialty Exam: ROS  Blood pressure 95/63, pulse (!) 111, height 6' (1.829 m), weight 213 lb (96.6 kg).Body mass index is 28.89 kg/m.  General Appearance: Meticulous  Eye Contact::  Good  Speech:  Clear and Coherent  Volume:  Normal  Mood:  Euthymic  Affect:  Appropriate  Thought Process:  Coherent  Orientation:  Full (Time, Place, and Person)  Thought Content:  WDL  Suicidal Thoughts:  No  Homicidal Thoughts:  No  Memory:  NA  Judgement:  NA  Insight:  Good  Psychomotor Activity:  Normal  Concentration:  Good  Recall:  Good  Fund of Knowledge:Good  Language: Good  Akathisia:  No  Handed:  Right  AIMS (if indicated):     Assets:  Communication Skills  ADL's:  Intact  Cognition: WNL  Sleep:      Treatment Plan Summary: 05/23/2023, 1:35 PM    This patient has 2 diagnoses is versus that of paranoid schizophrenia in remission.  He will continue taking Mellaril 25 mg.  His second problem is an adjustment disorder with an anxious mood state.  He  takes BuSpar for this.  His anxiety is absent.  He has had no falls physically he is doing very well and very pleased with his care.

## 2023-08-02 ENCOUNTER — Ambulatory Visit (HOSPITAL_BASED_OUTPATIENT_CLINIC_OR_DEPARTMENT_OTHER): Payer: 59 | Admitting: Family

## 2023-08-02 ENCOUNTER — Encounter (HOSPITAL_BASED_OUTPATIENT_CLINIC_OR_DEPARTMENT_OTHER): Payer: Self-pay | Admitting: Family

## 2023-08-02 VITALS — BP 140/92 | HR 96 | Ht 72.0 in | Wt 226.1 lb

## 2023-08-02 DIAGNOSIS — I5022 Chronic systolic (congestive) heart failure: Secondary | ICD-10-CM | POA: Diagnosis not present

## 2023-08-02 DIAGNOSIS — I25118 Atherosclerotic heart disease of native coronary artery with other forms of angina pectoris: Secondary | ICD-10-CM | POA: Diagnosis not present

## 2023-08-02 DIAGNOSIS — E785 Hyperlipidemia, unspecified: Secondary | ICD-10-CM | POA: Diagnosis not present

## 2023-08-02 DIAGNOSIS — I10 Essential (primary) hypertension: Secondary | ICD-10-CM

## 2023-08-02 MED ORDER — LOSARTAN POTASSIUM 50 MG PO TABS: 50.0000 mg | ORAL_TABLET | Freq: Every day | ORAL | 3 refills | Status: DC

## 2023-08-02 NOTE — Patient Instructions (Signed)
 Medication Instructions:  Your physician has recommended you make the following change in your medication:   Ensure you are not taking ENTRESTO    Change Losartan  50mg  daily    Labwork: Your physician recommends that you return for lab work in 1-2 weeks for BMP   Follow-Up: Please follow up in 2-3 months in [facility]N CLINIC with Dr. Theodis Fiscal, Neomi Banks, NP or Donivan Furry PharmD

## 2023-08-05 ENCOUNTER — Encounter (HOSPITAL_BASED_OUTPATIENT_CLINIC_OR_DEPARTMENT_OTHER): Payer: Self-pay | Admitting: Family

## 2023-08-05 NOTE — Progress Notes (Signed)
 Advanced Hypertension Clinic Assessment:    Date:  08/05/2023   ID:  Gilbert Harris., DOB Jun 18, 1945, MRN [id number]  PCP:  Charle Congo, MD  Cardiologist:  Maudine Sos, MD  Nephrologist:  Referring MD: Charle Congo, MD   CC: Hypertension  History of Present Illness:    Gilbert Harris. is a 78 y.o. male with a hx of HFmrEF, hypertension, hyperlipidemia, CAD s/p MI and CABG 2007, mild carotid stenosis, schizophrenia here to follow up in the [facility]pert[facility].   06/2020 BP was elevated and amlodipine  increased and referred to cardiology PharmD.  HCTZ transition to chlorthalidone .  At visit 06/2021 EKG with new LBBB but asymptomatic and BP controlled.  CMP, TSH, lipid, CBC only notable for mildly abnormal kidney function for stable from previous.  Seen 10/19/2022 noting intermittent episodes of random dry cough with initial BP 162/96 and repeat 160/90.  Also noted sporadic left chest pain and some shortness of breath.  Echocardiogram revealed LVEF 45 to 60%, RV mildly enlarged, LA moderately dilated, RA mildly dilated, mild MR, mild to moderate TR, mild AI. Lexiscan  Myoview  with prior infarct but no ischemia.  Chlorthalidone  stopped and started on Lasix  40 mg daily.  Metoprolol  was changed to carvedilol  for BP control.  He was seen 11/23/22 and started on Entresto  for elevated BP and HFmrEF. Atorvastatin  was increased as LDL 83 not at goal <70. At visit 12/28/22 as creatinine slightly increased, Lasix  reduced to 20mg  daily.   Last seen 02/01/23 with BP well controlled and no changes made.   Presents today for follow-up.  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  Walking for exercise but not recently participating in exercise classes. He had gained 10 lbs over 6 months which he attributes to inactivity. Entresto  and Losartan  both on medication list, he has only been taking Losartan . No chest pain, dyspnea, lightheadedness,  dizziness. Has small bump in left armpit which is not painful nor red and present for about 1 month. He has had similar bumps before and they independently resolve. Consider etiology small cyst vs ingrown hair. Encouraged ot discuss with PCP if persistently bothersome.   Previous antihypertensives: Losartan /hydrochlorothiazide  Metoprolol  - dwitched to carvedilol  Chlorthalidone  - switched to furosemide    Past Medical History:  Diagnosis Date   Carotid stenosis 01/05/2016   Coronary artery disease    CABG x4 LIMA to LAD, SV graft to RCA, sequential saphenous vein graft to diagnal and OM1   Fatigue 07/15/2021   HTN (hypertension)    Hyperlipidemia    MI (myocardial infarction) (HCC)    Schizophrenia (HCC)     Past Surgical History:  Procedure Laterality Date   CARDIAC CATHETERIZATION     EF 50%--severe two vessel obstructive atherosclerotic coronary artery disease--Low normal left ventricle funcrtion   CORONARY ARTERY BYPASS GRAFT  2007   HERNIA REPAIR     TONSILLECTOMY AND ADENOIDECTOMY Bilateral y-59    Current Medications: Current Meds  Medication Sig   amLODipine  (NORVASC ) 5 MG tablet TAKE 1 TABLET BY MOUTH IN THE  MORNING AND AT BEDTIME   Ascorbic Acid (VITAMIN C) 100 MG tablet Take 100 mg by mouth daily.   aspirin 81 MG EC tablet Take 81 mg by mouth daily. Swallow whole.   atorvastatin  (LIPITOR) 40 MG tablet Take 1 tablet (40 mg total) by mouth daily.   busPIRone  (BUSPAR ) 5 MG tablet Take 1 tablet (5 mg total) by mouth 2 (two) times daily.  Cholecalciferol (VITAMIN D -3) 5000 units TABS Take 5,000 Units by mouth daily.   hydroxypropyl methylcellulose / hypromellose (ISOPTO TEARS / GONIOVISC) 2.5 % ophthalmic solution Place 1 drop into both eyes 4 (four) times daily as needed for dry eyes.   latanoprost (XALATAN) 0.005 % ophthalmic solution Place 1 drop into both eyes at bedtime.   Multiple Vitamin (MULTIVITAMIN) tablet Take 1 tablet by mouth daily.     nitroGLYCERIN   (NITROSTAT ) 0.4 MG SL tablet DISSOLVE 1 TABLET UNDER THE  TONGUE EVERY 5 MINUTES AS NEEDED FOR CHEST PAIN. MAX OF 3 TABLETS IN 15 MINUTES. CALL 911 IF PAIN  PERSISTS.   thioridazine  (MELLARIL ) 25 MG tablet 1  qhs   [DISCONTINUED] losartan  (COZAAR ) 25 MG tablet Take 25 mg by mouth daily.   [DISCONTINUED] sacubitril-valsartan (ENTRESTO ) 24-26 MG Take 1 tablet by mouth 2 (two) times daily.     Allergies:   Haloperidol decanoate and Haloperidol lactate   Social History   Socioeconomic History   Marital status: Single    Spouse name: Not on file   Number of children: Not on file   Years of education: Not on file   Highest education level: Not on file  Occupational History   Not on file  Tobacco Use   Smoking status: Former    Current packs/day: 0.00    Types: Cigarettes    Quit date: 02/03/2006    Years since quitting: 17.5   Smokeless tobacco: Never  Vaping Use   Vaping status: Never Used  Substance and Sexual Activity   Alcohol use: No    Alcohol/week: 0.0 standard drinks of alcohol   Drug use: No   Sexual activity: Not Currently  Other Topics Concern   Not on file  Social History Narrative   Not on file   Social Drivers of Health   Financial Resource Strain: Not on file  Food Insecurity: Not on file  Transportation Needs: Not on file  Physical Activity: Not on file  Stress: Not on file  Social Connections: Not on file     Family History: The patient's family history includes Heart attack in his father and mother; Heart disease in his father, maternal grandmother, and mother; Heart failure in his maternal aunt; Stroke in his maternal grandfather.  ROS:   Please see the history of present illness.     All other systems reviewed and are negative.  EKGs/Labs/Other Studies Reviewed:         Recent Labs: 12/28/2022: ALT 26; BUN 21; Creatinine, Ser 1.63; Potassium 4.3; Sodium 142   Recent Lipid Panel    Component Value Date/Time   CHOL 161 11/23/2022 1328   TRIG  55 11/23/2022 1328   HDL 67 11/23/2022 1328   CHOLHDL 2.4 11/23/2022 1328   CHOLHDL 2.3 04/21/2016 1032   VLDL 17 04/21/2016 1032   LDLCALC 83 11/23/2022 1328   LDLDIRECT 67 12/28/2022 1507    Physical Exam:   VS:  BP (!) 140/92   Pulse 96   Ht 6' (1.829 m)   Wt 226 lb 1.6 oz (102.6 kg)   SpO2 96%   BMI 30.66 kg/m  , BMI Body mass index is 30.66 kg/m. GENERAL:  Well appearing HEENT: Pupils equal round and reactive, fundi not visualized, oral mucosa unremarkable NECK:  No jugular venous distention, waveform within normal limits, carotid upstroke brisk and symmetric, no bruits, no thyromegaly LYMPHATICS:  No cervical adenopathy LUNGS:  Clear to auscultation bilaterally HEART:  RRR.  PMI not displaced or sustained,S1  and S2 within normal limits, no S3, no S4, no clicks, no rubs, no murmurs ABD:  Flat, positive bowel sounds normal in frequency in pitch, no bruits, no rebound, no guarding, no midline pulsatile mass, no hepatomegaly, no splenomegaly EXT:  2 plus pulses throughout, no edema, no cyanosis no clubbing SKIN:  No rashes no nodules NEURO:  Cranial nerves II through XII grossly intact, motor grossly intact throughout PSYCH:  Cognitively intact, oriented to person place and time   ASSESSMENT/PLAN:    HFmrEF / HTN - Euvolemic and well compensated on exam.  Echo 11/20/2022 LVEF 45-50%.  Myoview  09/2022 with prior infarct but no new ischemia.  Present GDMT includes Carvedilol  25mg  BID, Lasix  20mg  daily. NYHA I. Due to uncontrolled BP, increase Losartan  to 50mg  daily. BMET in 1-2 weeks.  Unclear why Entresto  changed to Losartan  (suspect cost) but as feeling well, will not change.  If heart failure symptoms noted in future consider MRA/SGLT2i. Low sodium diet, fluid restriction <2L, and daily weights encouraged. Educated to contact our office for weight gain of 2 lbs overnight or 5 lbs in one week.   CAD s/p CABG 2007 / HLD, LDL goal <70 - Myoview  09/2022 low risk study and no ischemia.  GDMT aspirin, amlodipine , atorvastatin , carvedilol , as needed nitroglycerin . Notes no chest pain or shortness of breath. Continue current regimen. Recommend aiming for 150 minutes of moderate intensity activity per week and following a heart healthy diet.   Screening for Secondary Hypertension:     Relevant Labs/Studies:    Latest Ref Rng & Units 12/28/2022    3:07 PM 11/23/2022    1:28 PM 07/15/2021    8:53 AM  Basic Labs  Sodium 134 - 144 mmol/L 142  139  141   Potassium 3.5 - 5.2 mmol/L 4.3  4.5  4.0   Creatinine 0.76 - 1.27 mg/dL 4.09  8.11  9.14        Latest Ref Rng & Units 07/15/2021    8:53 AM 06/05/2013   11:09 AM  Thyroid    TSH 0.450 - 4.500 uIU/mL 2.400  1.703                       Disposition:    FU with MD/PharmD/APP in 2-3 months.   Medication Adjustments/Labs and Tests Ordered: Current medicines are reviewed at length with the patient today.  Concerns regarding medicines are outlined above.  Orders Placed This Encounter  Procedures   Basic metabolic panel with GFR   Meds ordered this encounter  Medications   losartan  (COZAAR ) 50 MG tablet    Sig: Take 1 tablet (50 mg total) by mouth daily.    Dispense:  90 tablet    Refill:  3     Signed, Clearnce Curia, NP  08/05/2023 5:26 PM    [facility]

## 2023-08-14 ENCOUNTER — Other Ambulatory Visit (HOSPITAL_BASED_OUTPATIENT_CLINIC_OR_DEPARTMENT_OTHER): Payer: Self-pay | Admitting: Family

## 2023-08-14 ENCOUNTER — Other Ambulatory Visit: Payer: Self-pay | Admitting: Cardiovascular Disease

## 2023-08-15 ENCOUNTER — Other Ambulatory Visit (HOSPITAL_BASED_OUTPATIENT_CLINIC_OR_DEPARTMENT_OTHER): Payer: Self-pay | Admitting: *Deleted

## 2023-08-15 MED ORDER — ATORVASTATIN CALCIUM 40 MG PO TABS: 40.0000 mg | ORAL_TABLET | Freq: Every day | ORAL | 3 refills | Status: AC

## 2023-08-21 ENCOUNTER — Other Ambulatory Visit (HOSPITAL_BASED_OUTPATIENT_CLINIC_OR_DEPARTMENT_OTHER): Payer: Self-pay | Admitting: Cardiovascular Disease

## 2023-10-04 ENCOUNTER — Ambulatory Visit (HOSPITAL_BASED_OUTPATIENT_CLINIC_OR_DEPARTMENT_OTHER): Admitting: Family

## 2023-10-04 ENCOUNTER — Encounter (HOSPITAL_BASED_OUTPATIENT_CLINIC_OR_DEPARTMENT_OTHER): Payer: Self-pay | Admitting: Family

## 2023-10-04 VITALS — BP 129/83 | HR 96 | Ht 72.0 in | Wt 208.0 lb

## 2023-10-04 DIAGNOSIS — E785 Hyperlipidemia, unspecified: Secondary | ICD-10-CM

## 2023-10-04 DIAGNOSIS — I10 Essential (primary) hypertension: Secondary | ICD-10-CM | POA: Diagnosis not present

## 2023-10-04 DIAGNOSIS — I25118 Atherosclerotic heart disease of native coronary artery with other forms of angina pectoris: Secondary | ICD-10-CM

## 2023-10-04 MED ORDER — LOSARTAN POTASSIUM 25 MG PO TABS: 25.0000 mg | ORAL_TABLET | Freq: Every day | ORAL | 1 refills | Status: DC

## 2023-10-04 NOTE — Patient Instructions (Signed)
 Medication Instructions:   CONTINUE Losartan  25mg  daily We sent updated prescription to [facility] If you have the 50mg  tablets you can split them in half if you wish   Labwork: None ordered today. We will request labs from Dr. Dewanda office.   Testing/Procedures: Your EKG today looks good!   Follow-Up: Please follow up in 6  months in [facility]IN[facility] with Dr. Raford, Reche Finder, NP or Allean Mink PharmD    Special Instructions:    Heart Healthy Diet Recommendations: A low-salt diet is recommended. Meats should be grilled, baked, or boiled. Avoid fried foods. Focus on lean protein sources like fish or chicken with vegetables and fruits. The American Heart Association is a Chief Technology Officer!  American Heart Association Diet and Lifeystyle Recommendations   Exercise recommendations: The American Heart Association recommends 150 minutes of moderate intensity exercise weekly. Try 30 minutes of moderate intensity exercise 4-5 times per week. This could include walking, jogging, or swimming.

## 2023-10-04 NOTE — Progress Notes (Signed)
 Advanced Hypertension Clinic Assessment:    Date:  10/04/2023   ID:  Gilbert Harris Mal Mickey., DOB October 15, 1945, MRN [id number]  PCP:  Shelda Atlas, MD  Cardiologist:  Annabella Scarce, MD  Nephrologist:  Referring MD: Shelda Atlas, MD   CC: Hypertension  History of Present Illness:    Gilbert Harris. is a 78 y.o. male with a hx of HFmrEF, hypertension, hyperlipidemia, CAD s/p MI and CABG 2007, mild carotid stenosis, schizophrenia here to follow up in the Advanced Hypertension Clinic.   06/2020 BP was elevated and amlodipine  increased and referred to cardiology PharmD.  HCTZ transition to chlorthalidone .  At visit 06/2021 EKG with new LBBB but asymptomatic and BP controlled.  CMP, TSH, lipid, CBC only notable for mildly abnormal kidney function for stable from previous.  Seen 10/19/2022 noting intermittent episodes of random dry cough with initial BP 162/96 and repeat 160/90.  Also noted sporadic left chest pain and some shortness of breath.  Echocardiogram revealed LVEF 45 to 60%, RV mildly enlarged, LA moderately dilated, RA mildly dilated, mild MR, mild to moderate TR, mild AI. Lexiscan  Myoview  with prior infarct but no ischemia.  Chlorthalidone  stopped and started on Lasix  40 mg daily.  Metoprolol  was changed to carvedilol  for BP control.  He was seen 11/23/22 and started on Entresto  for elevated BP and HFmrEF. Atorvastatin  was increased as LDL 83 not at goal <70. At visit 12/28/22 as creatinine slightly increased, Lasix  reduced to 20mg  daily. At visit 08/02/23, Losartan  increased to 50mg  daily.   Presents today for follow-up.  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 He notes he has not been walking as much recently due to the weather. Not presently participating in exercise classes. Does note where he lives at [facility] sometimes a group will go to exercise classes at  [facility], encouraged to try to get the group back to going. Reports no shortness of breath nor dyspnea on exertion. Reports no chest pain, pressure, or tightness. No edema, orthopnea, PND. Reports no palpitations.  Of note, inadvertently continued Losartan  25mg  daily and did not increase to 50mg  daily.   Previous antihypertensives: Losartan /hydrochlorothiazide  Metoprolol  - dwitched to carvedilol  Chlorthalidone  - switched to furosemide    Past Medical History:  Diagnosis Date   Carotid stenosis 01/05/2016   Coronary artery disease    CABG x4 LIMA to LAD, SV graft to RCA, sequential saphenous vein graft to diagnal and OM1   Fatigue 07/15/2021   HTN (hypertension)    Hyperlipidemia    MI (myocardial infarction) (HCC)    Schizophrenia (HCC)     Past Surgical History:  Procedure Laterality Date   CARDIAC CATHETERIZATION     EF 50%--severe two vessel obstructive atherosclerotic coronary artery disease--Low normal left ventricle funcrtion   CORONARY ARTERY BYPASS GRAFT  2007   HERNIA REPAIR     TONSILLECTOMY AND ADENOIDECTOMY Bilateral y-59    Current Medications: Current Meds  Medication Sig   amLODipine  (NORVASC ) 5 MG tablet TAKE 1 TABLET BY MOUTH IN THE  MORNING AND AT BEDTIME   Ascorbic Acid (VITAMIN C) 100 MG tablet Take 100 mg by mouth daily.   aspirin 81 MG EC tablet Take 81 mg by mouth daily. Swallow whole.   atorvastatin  (LIPITOR) 40 MG tablet Take 1 tablet (40 mg total) by mouth daily.   busPIRone  (BUSPAR ) 5 MG tablet Take 1  tablet (5 mg total) by mouth 2 (two) times daily.   Cholecalciferol (VITAMIN D -3) 5000 units TABS Take 5,000 Units by mouth daily.   furosemide  (LASIX ) 20 MG tablet Take 1 tablet (20 mg total) by mouth daily.   latanoprost (XALATAN) 0.005 % ophthalmic solution Place 1 drop into both eyes at bedtime.   losartan  (COZAAR ) 50 MG tablet Take 1 tablet (50 mg total) by mouth daily. (Patient taking differently: Take 25 mg by mouth daily.)   metoprolol   succinate (TOPROL -XL) 50 MG 24 hr tablet Take 50 mg by mouth daily.   Multiple Vitamin (MULTIVITAMIN) tablet Take 1 tablet by mouth daily.     nitroGLYCERIN  (NITROSTAT ) 0.4 MG SL tablet DISSOLVE 1 TABLET UNDER THE  TONGUE EVERY 5 MINUTES AS NEEDED FOR CHEST PAIN. MAX OF 3 TABLETS IN 15 MINUTES. CALL 911 IF PAIN  PERSISTS.   thioridazine  (MELLARIL ) 25 MG tablet 1  qhs     Allergies:   Haloperidol decanoate and Haloperidol lactate   Social History   Socioeconomic History   Marital status: Single    Spouse name: Not on file   Number of children: Not on file   Years of education: Not on file   Highest education level: Not on file  Occupational History   Not on file  Tobacco Use   Smoking status: Former    Current packs/day: 0.00    Types: Cigarettes    Quit date: 02/03/2006    Years since quitting: 17.6   Smokeless tobacco: Never  Vaping Use   Vaping status: Never Used  Substance and Sexual Activity   Alcohol use: No    Alcohol/week: 0.0 standard drinks of alcohol   Drug use: No   Sexual activity: Not Currently  Other Topics Concern   Not on file  Social History Narrative   Not on file   Social Drivers of Health   Financial Resource Strain: Not on file  Food Insecurity: Not on file  Transportation Needs: Not on file  Physical Activity: Not on file  Stress: Not on file  Social Connections: Not on file     Family History: The patient's family history includes Heart attack in his father and mother; Heart disease in his father, maternal grandmother, and mother; Heart failure in his maternal aunt; Stroke in his maternal grandfather.  ROS:   Please see the history of present illness.     All other systems reviewed and are negative.  EKGs/Labs/Other Studies Reviewed:         Recent Labs: 12/28/2022: ALT 26; BUN 21; Creatinine, Ser 1.63; Potassium 4.3; Sodium 142   Recent Lipid Panel    Component Value Date/Time   CHOL 161 11/23/2022 1328   TRIG 55 11/23/2022 1328    HDL 67 11/23/2022 1328   CHOLHDL 2.4 11/23/2022 1328   CHOLHDL 2.3 04/21/2016 1032   VLDL 17 04/21/2016 1032   LDLCALC 83 11/23/2022 1328   LDLDIRECT 67 12/28/2022 1507    Physical Exam:   VS:  BP 129/83 (BP Location: Left Arm, Patient Position: Sitting, Cuff Size: Normal)   Pulse 96   Ht 6' (1.829 m)   Wt 208 lb (94.3 kg)   SpO2 97%   BMI 28.21 kg/m  , BMI Body mass index is 28.21 kg/m. GENERAL:  Well appearing HEENT: Pupils equal round and reactive, fundi not visualized, oral mucosa unremarkable NECK:  No jugular venous distention, waveform within normal limits, carotid upstroke brisk and symmetric, no bruits, no thyromegaly LYMPHATICS:  No  cervical adenopathy LUNGS:  Clear to auscultation bilaterally HEART:  RRR.  PMI not displaced or sustained,S1 and S2 within normal limits, no S3, no S4, no clicks, no rubs, no murmurs ABD:  Flat, positive bowel sounds normal in frequency in pitch, no bruits, no rebound, no guarding, no midline pulsatile mass, no hepatomegaly, no splenomegaly EXT:  2 plus pulses throughout, no edema, no cyanosis no clubbing SKIN:  No rashes no nodules NEURO:  Cranial nerves II through XII grossly intact, motor grossly intact throughout PSYCH:  Cognitively intact, oriented to person place and time   ASSESSMENT/PLAN:    HFmrEF / HTN - Euvolemic and well compensated on exam.  Echo 11/20/2022 LVEF 45-50%.  Myoview  09/2022 with prior infarct but no new ischemia.  Present GDMT includes Carvedilol  25mg  BID, Lasix  20mg  daily, Losartan  25mg  daily. At last visit was recommended to increase to 50mg  dose Losartan , he inadvertently did not do so. As BP controlled today in clinic, will remain on Losartan  25mg .. NYHA I. Has upcoming labs with PCP, will request BMET be collected for monitoring.  If heart failure symptoms noted in future consider MRA/SGLT2i. Low sodium diet, fluid restriction <2L, and daily weights encouraged. Educated to contact our office for weight gain of 2  lbs overnight or 5 lbs in one week.   CAD s/p CABG 2007 / HLD, LDL goal <70 - Myoview  09/2022 low risk study and no ischemia. GDMT aspirin, amlodipine , atorvastatin , carvedilol , as needed nitroglycerin . Notes no chest pain or shortness of breath. Continue current regimen. Recommend aiming for 150 minutes of moderate intensity activity per week and following a heart healthy diet.   Screening for Secondary Hypertension:     Relevant Labs/Studies:    Latest Ref Rng & Units 12/28/2022    3:07 PM 11/23/2022    1:28 PM 07/15/2021    8:53 AM  Basic Labs  Sodium 134 - 144 mmol/L 142  139  141   Potassium 3.5 - 5.2 mmol/L 4.3  4.5  4.0   Creatinine 0.76 - 1.27 mg/dL 8.36  8.65  8.57        Latest Ref Rng & Units 07/15/2021    8:53 AM 06/05/2013   11:09 AM  Thyroid    TSH 0.450 - 4.500 uIU/mL 2.400  1.703                  Disposition:    FU with MD/PharmD/APP in 6 months.   Medication Adjustments/Labs and Tests Ordered: Current medicines are reviewed at length with the patient today.  Concerns regarding medicines are outlined above.  Orders Placed This Encounter  Procedures   EKG 12-Lead   No orders of the defined types were placed in this encounter.    Signed, Reche GORMAN Finder, NP  10/04/2023 1:59 PM    [facility]

## 2023-11-21 ENCOUNTER — Other Ambulatory Visit: Payer: Self-pay

## 2023-11-21 ENCOUNTER — Encounter (HOSPITAL_COMMUNITY): Payer: Self-pay | Admitting: Psychiatry

## 2023-11-21 ENCOUNTER — Ambulatory Visit (HOSPITAL_BASED_OUTPATIENT_CLINIC_OR_DEPARTMENT_OTHER): Payer: 59 | Admitting: Psychiatry

## 2023-11-21 VITALS — BP 149/76 | HR 65 | Ht 72.0 in | Wt 210.0 lb

## 2023-11-21 DIAGNOSIS — F200 Paranoid schizophrenia: Secondary | ICD-10-CM | POA: Diagnosis not present

## 2023-11-21 MED ORDER — THIORIDAZINE HCL 25 MG PO TABS: ORAL_TABLET | ORAL | 3 refills | Status: AC

## 2023-11-21 MED ORDER — BUSPIRONE HCL 5 MG PO TABS: 5.0000 mg | ORAL_TABLET | Freq: Two times a day (BID) | ORAL | 3 refills | Status: AC

## 2023-11-21 NOTE — Progress Notes (Signed)
 Patient ID: Gilbert FORBES Mal Mickey., male   DOB: [birth year], 78 y.o.   MRN: [id number] [facility] MD Progress Note  11/21/2023 1:52 PM Gilbert FORBES Mal Mickey.  MRN:  [id number] Subjective: Doing well Principal Problem: Schizophrenia residual Diagreat.  Schizophrenia in remission  Today the patient is doing very well.  He is at his baseline.  He likes where he lives.  He lives at the [facility] which has about 20 residents.  This week they went to [employer] and had a great time.  The patient recently saw his cardiologist and got good news that he is stable.  Financially the patient is doing well.  He takes his medicines as prescribed.  He has 2 sons once in [place] once in [place] both remarried he has 4 grandchildren and is doing great.  The patient also has 3 siblings.  The patient has no evidence of psychosis.  He is functioning extremely well.  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  He is positive and optimistic.  He shows no signs of tardive dyskinesia.  His health is good.   Past Surgical History:  Procedure Laterality Date   CARDIAC CATHETERIZATION     EF 50%--severe two vessel obstructive atherosclerotic coronary artery disease--Low normal left ventricle funcrtion   CORONARY ARTERY BYPASS GRAFT  2007   HERNIA REPAIR     TONSILLECTOMY AND ADENOIDECTOMY Bilateral y-59   Family History:  Family History  Problem Relation Age of Onset   Heart disease Mother    Heart attack Mother    Heart disease Father    Heart attack Father    Heart disease Maternal Grandmother    Stroke Maternal Grandfather    Heart failure Maternal Aunt    Family Psychiatric  History:  Social History:  Social History   Substance and Sexual Activity  Alcohol Use No   Alcohol/week: 0.0 standard drinks of alcohol     Social History   Substance and Sexual Activity  Drug Use No    Social History   Socioeconomic History   Marital status: Single    Spouse name: Not on file   Number of children: Not on  file   Years of education: Not on file   Highest education level: Not on file  Occupational History   Not on file  Tobacco Use   Smoking status: Former    Current packs/day: 0.00    Types: Cigarettes    Quit date: 02/03/2006    Years since quitting: 17.8   Smokeless tobacco: Never  Vaping Use   Vaping status: Never Used  Substance and Sexual Activity   Alcohol use: No    Alcohol/week: 0.0 standard drinks of alcohol   Drug use: No   Sexual activity: Not Currently  Other Topics Concern   Not on file  Social History Narrative   Not on file   Social Drivers of Health   Financial Resource Strain: Not on file  Food Insecurity: Not on file  Transportation Needs: Not on file  Physical Activity: Not on file  Stress: Not on file  Social Connections: Not on file   Additional Social History:                         Sleep: Good  Appetite:  Good  Current Medications: Current Outpatient Medications  Medication Sig Dispense Refill   amLODipine  (NORVASC ) 5 MG tablet TAKE 1 TABLET BY MOUTH IN THE  MORNING AND AT BEDTIME  200 tablet 2   Ascorbic Acid (VITAMIN C) 100 MG tablet Take 100 mg by mouth daily.     aspirin 81 MG EC tablet Take 81 mg by mouth daily. Swallow whole.     atorvastatin  (LIPITOR) 40 MG tablet Take 1 tablet (40 mg total) by mouth daily. 90 tablet 3   chlorthalidone  (HYGROTON ) 25 MG tablet Take 25 mg by mouth daily.     Cholecalciferol (VITAMIN D -3) 5000 units TABS Take 5,000 Units by mouth daily.     furosemide  (LASIX ) 20 MG tablet Take 1 tablet (20 mg total) by mouth daily. 90 tablet 3   hydroxypropyl methylcellulose / hypromellose (ISOPTO TEARS / GONIOVISC) 2.5 % ophthalmic solution Place 1 drop into both eyes 4 (four) times daily as needed for dry eyes.     latanoprost (XALATAN) 0.005 % ophthalmic solution Place 1 drop into both eyes at bedtime.     losartan  (COZAAR ) 25 MG tablet Take 1 tablet (25 mg total) by mouth daily. 90 tablet 1   metoprolol   tartrate (LOPRESSOR ) 50 MG tablet Take 50 mg by mouth 2 (two) times daily.     Multiple Vitamin (MULTIVITAMIN) tablet Take 1 tablet by mouth daily.       nitroGLYCERIN  (NITROSTAT ) 0.4 MG SL tablet DISSOLVE 1 TABLET UNDER THE  TONGUE EVERY 5 MINUTES AS NEEDED FOR CHEST PAIN. MAX OF 3 TABLETS IN 15 MINUTES. CALL 911 IF PAIN  PERSISTS. 125 tablet 0   busPIRone  (BUSPAR ) 5 MG tablet Take 1 tablet (5 mg total) by mouth 2 (two) times daily. 180 tablet 3   thioridazine  (MELLARIL ) 25 MG tablet 1  qhs 90 tablet 3   No current facility-administered medications for this visit.    Lab Results: No results found for this or any previous visit (from the past 48 hours).  Physical Findings: AIMS:  , ,  ,  ,    CIWA:    COWS:     Musculoskeletal: Strength & Muscle Tone: within normal limits Gait & Station: normal Patient leans:  Psychiatric Specialty Exam: ROS  Blood pressure (!) 149/76, pulse 65, height 6' (1.829 m), weight 210 lb (95.3 kg).Body mass index is 28.48 kg/m.  General Appearance: Meticulous  Eye Contact::  Good  Speech:  Clear and Coherent  Volume:  Normal  Mood:  Euthymic  Affect:  Appropriate  Thought Process:  Coherent  Orientation:  Full (Time, Place, and Person)  Thought Content:  WDL  Suicidal Thoughts:  No  Homicidal Thoughts:  No  Memory:  NA  Judgement:  NA  Insight:  Good  Psychomotor Activity:  Normal  Concentration:  Good  Recall:  Good  Fund of Knowledge:Good  Language: Good  Akathisia:  No  Handed:  Right  AIMS (if indicated):     Assets:  Communication Skills  ADL's:  Intact  Cognition: WNL  Sleep:      Treatment Plan Summary: 11/21/2023, 1:52 PM  This patient's diagnosis is that of paranoid schizophrenia in remission.  He will continue taking low dose of Mellaril  25 mg a day.  His second problem is an adjustment disorder with an anxious mood state.  For this he takes BuSpar  5 mg twice daily.  Together with these 2 medications he is doing extremely well.   He will return to see me in 6 months.

## 2023-12-17 ENCOUNTER — Other Ambulatory Visit (HOSPITAL_BASED_OUTPATIENT_CLINIC_OR_DEPARTMENT_OTHER): Payer: Self-pay | Admitting: Family

## 2023-12-17 DIAGNOSIS — I10 Essential (primary) hypertension: Secondary | ICD-10-CM

## 2024-01-14 ENCOUNTER — Other Ambulatory Visit (HOSPITAL_BASED_OUTPATIENT_CLINIC_OR_DEPARTMENT_OTHER): Payer: Self-pay | Admitting: Cardiovascular Disease

## 2024-01-14 ENCOUNTER — Other Ambulatory Visit (HOSPITAL_BASED_OUTPATIENT_CLINIC_OR_DEPARTMENT_OTHER): Payer: Self-pay | Admitting: Family

## 2024-04-01 ENCOUNTER — Encounter (HOSPITAL_BASED_OUTPATIENT_CLINIC_OR_DEPARTMENT_OTHER): Payer: Self-pay | Admitting: Cardiovascular Disease

## 2024-04-01 ENCOUNTER — Ambulatory Visit (INDEPENDENT_AMBULATORY_CARE_PROVIDER_SITE_OTHER): Admitting: Cardiovascular Disease

## 2024-04-01 VITALS — BP 112/60 | HR 70 | Ht 72.0 in | Wt 212.0 lb

## 2024-04-01 DIAGNOSIS — I2510 Atherosclerotic heart disease of native coronary artery without angina pectoris: Secondary | ICD-10-CM

## 2024-04-01 DIAGNOSIS — I5020 Unspecified systolic (congestive) heart failure: Secondary | ICD-10-CM

## 2024-04-01 DIAGNOSIS — I10 Essential (primary) hypertension: Secondary | ICD-10-CM | POA: Diagnosis not present

## 2024-04-01 DIAGNOSIS — I5022 Chronic systolic (congestive) heart failure: Secondary | ICD-10-CM | POA: Insufficient documentation

## 2024-04-01 DIAGNOSIS — E7800 Pure hypercholesterolemia, unspecified: Secondary | ICD-10-CM | POA: Diagnosis not present

## 2024-04-01 DIAGNOSIS — I6523 Occlusion and stenosis of bilateral carotid arteries: Secondary | ICD-10-CM

## 2024-04-01 NOTE — Progress Notes (Signed)
 " Cardiology Office Note:  .    Date:  04/01/2024  ID:  Gilbert Harris., DOB Aug 22, 1945, MRN [id number] PCP: Shelda Atlas, MD  [facility] Providers Cardiologist:  Annabella Scarce, MD     History of Present Illness: .    [name]. is a 79 y.o. male with hypertension, hyperlipidemia, CAD s/p MI and CABG in 2007, mild carotid stenosis, syncope, schizophrenia, who presents for follow up. In 06/2020 his BP was more elevated.  Amlodipine  was increased and he was referred to our PharmD.  HCTZ was switched to chlorthalidone .  His home BP averaged 107/70, though his in office BP was in the 130s.  No changes were made at his 09/2020 appointment.    At his follow-up visit 06/2021, he was exercising routinely and generally feeling well. EKG at that visit showed new LBBB, but he was asymptomatic. Blood pressure was well controlled. He did complain of feeling more fatigued and taking more naps. CMP, TSH, lipids, and CBC were checked. Normal liver function and blood counts. Cholesterol was excellent. Kidney function was mildly abnormal but stable.   At his visit 09/2022 he was feeling well but struggled with a dry cough.  He also noted some atypical left-sided chest pain.  He noted increased lower extremity edema.  He was referred for an echo which revealed LVEF 45-50% with global hypokinesis and indeterminate diastolic function.  GLS was -14 point 0.8%.  Right ventricular function was normal and there was mild to moderate TR. chlorthalidone  was switched to Lasix .  Metoprolol  switched to carvedilol .  He had a nuclear stress test which revealed LVEF 55% and a fixed defect in the mid to basal lateral myocardium consistent with prior infarction.  He was seen 11/23/2022 and started on Entresto .  Atorvastatin  was increased.  07/2023 losartan  was increased but he accidentally did not make that change.  Blood pressure was controlled when he was seen 09/2023 and he was doing well.  Discussed the use of AI  scribe software for clinical note transcription with the patient, who gave verbal consent to proceed.  History of Present Illness [name] feels well overall and maintains physical activity through regular walking, which he finds beneficial. He has not been attending exercise classes at the gym.  He denies any issues with his breathing and reports wearing compression socks regularly. He wears compression socks regularly, typically at night, but occasionally removes them to relax his feet, which he finds beneficial.  His diet is generally good, with efforts to keep salt intake low, although he occasionally consumes potato chips. He visits the grocery store at least once a week to maintain his diet.  He is currently taking chlorthalidone  and furosemide  for fluid management, atorvastatin  for cholesterol, and a baby aspirin for cardiovascular health. His blood pressure medications include losartan , metoprolol , and amlodipine . He has not checked his blood pressure at home recently but has a monitor available.  He has not experienced any new or concerning symptoms and feels he has been doing well over the past few months.   Studies Reviewed: SABRA       EKG 10/19/22: Sinus rhythm.  Rate 63 bpm.  LBBB.  PACs.   Echo 10/2022:   1. Left ventricular ejection fraction, by estimation, is 45 to 50%. The  left ventricle has mildly decreased function. The left ventricle  demonstrates global hypokinesis. Left ventricular diastolic parameters are  indeterminate. The average left  ventricular global longitudinal strain is -14.8 %. The global  longitudinal  strain is abnormal.   2. Right ventricular systolic function is normal. The right ventricular  size is mildly enlarged. There is normal pulmonary artery systolic  pressure. The estimated right ventricular systolic pressure is 30.9 mmHg.   3. Left atrial size was moderately dilated.   4. Right atrial size was mildly dilated.   5. The mitral valve is normal in  structure. Mild mitral valve  regurgitation. No evidence of mitral stenosis.   6. Tricuspid valve regurgitation is mild to moderate.   7. The aortic valve is tricuspid. There is mild calcification of the  aortic valve. There is mild thickening of the aortic valve. Aortic valve  regurgitation is mild. Aortic valve sclerosis is present, with no evidence  of aortic valve stenosis. Aortic  regurgitation PHT measures 648 msec. Aortic valve mean gradient measures  3.0 mmHg. Aortic valve Vmax measures 1.27 m/s.   8. Pulmonic valve regurgitation is moderate.   9. The inferior vena cava is normal in size with greater than 50%  respiratory variability, suggesting right atrial pressure of 3 mmHg.   Lexiscan  Myoview  10/23/23:   Findings are consistent with infarction and no ischemia. The study is low risk.   No ST deviation was noted.   LV perfusion is abnormal. Defect 1: There is a medium defect with mild reduction in uptake present in the mid to basal lateral location(s) that is fixed. There is abnormal wall motion in the defect area. Consistent with infarction.   Left ventricular function is normal. Nuclear stress EF: 55%. The left ventricular ejection fraction is normal (55-65%). End diastolic cavity size is normal. There is LBBB related paradoxical septal motion.   Prior study not available for comparison.   Abnormal myocardial perfusion study with a medium-sized area of non-transmurral infarction in the basal-mid inferolateral wall. No reversible ischemia is seen and overall systolic function is normal. Overall low risk study.     Risk Assessment/Calculations:             Physical Exam:    VS:  BP 112/60 (BP Location: Left Arm, Patient Position: Sitting, Cuff Size: Normal)   Pulse 70   Ht 6' (1.829 m)   Wt 212 lb (96.2 kg)   SpO2 98%   BMI 28.75 kg/m  , BMI Body mass index is 28.75 kg/m. GENERAL:  Well appearing HEENT: Pupils equal round and reactive, fundi not visualized, oral  mucosa unremarkable NECK:  + JVP to 4 cm above clavicle at 45 degrees.  +HJR.  Waveform within normal limits, carotid upstroke brisk and symmetric, no bruits, no thyromegaly LUNGS:  Clear to auscultation bilaterally HEART:  RRR.  PMI not displaced or sustained,S1 and S2 within normal limits, no S3, no S4, no clicks, no rubs, no murmurs ABD:  Flat, positive bowel sounds normal in frequency in pitch, no bruits, no rebound, no guarding, no midline pulsatile mass, no hepatomegaly, no splenomegaly. No abdominal tenderness. EXT:  2 plus pulses throughout, 1+ LE edema bilaterally, no cyanosis no clubbing SKIN:  No rashes no nodules NEURO:  Cranial nerves II through XII grossly intact, motor grossly intact throughout PSYCH:  Cognitively intact, oriented to person place and time  Wt Readings from Last 3 Encounters:  04/01/24 212 lb (96.2 kg)  10/04/23 208 lb (94.3 kg)  08/02/23 226 lb 1.6 oz (102.6 kg)     ASSESSMENT AND PLAN: .    Assessment & Plan # Atherosclerotic heart disease of native coronary artery without angina No angina. Blood pressure  controlled. Normal heart and lung exam. - Continue atorvastatin . - Continue aspirin 81mg  daily.  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  He is euvolemic and doing well.  - Schedule echocardiogram.  # Essential hypertension Blood pressure controlled with current regimen. - Continue losartan , chlorthalidone , metoprolol , and amlodipine .  # Pure hypercholesterolemia Cholesterol management ongoing with atorvastatin . - Continue atorvastatin . - Check lipids and CMP    Disposition:  f/u 6 months APP, 1 year with me    Signed, Annabella Scarce, MD   "

## 2024-04-01 NOTE — Patient Instructions (Signed)
 Medication Instructions:  Your physician recommends that you continue on your current medications as directed. Please refer to the Current Medication list given to you today.  Labwork: NONE  Testing/Procedures: Your physician has requested that you have an echocardiogram. Echocardiography is a painless test that uses sound waves to create images of your heart. It provides your doctor with information about the size and shape of your heart and how well your hearts chambers and valves are working. This procedure takes approximately one hour. There are no restrictions for this procedure. Please do NOT wear cologne, perfume, aftershave, or lotions (deodorant is allowed). Please arrive 15 minutes prior to your appointment time.  Please note: We ask at that you not bring children with you during ultrasound (echo/ vascular) testing. Due to room size and safety concerns, children are not allowed in the ultrasound rooms during exams. Our front office staff cannot provide observation of children in our lobby area while testing is being conducted. An adult accompanying a patient to their appointment will only be allowed in the ultrasound room at the discretion of the ultrasound technician under special circumstances. We apologize for any inconvenience.   Follow-Up: 6 MONTHS WITH CAITLIN W NP AND 1 YEAR WITH DR [facility]  If you need a refill on your cardiac medications before your next appointment, please call your pharmacy.

## 2024-05-02 ENCOUNTER — Other Ambulatory Visit (HOSPITAL_BASED_OUTPATIENT_CLINIC_OR_DEPARTMENT_OTHER)

## 2024-05-02 DIAGNOSIS — I10 Essential (primary) hypertension: Secondary | ICD-10-CM

## 2024-05-02 DIAGNOSIS — I5020 Unspecified systolic (congestive) heart failure: Secondary | ICD-10-CM

## 2024-05-02 LAB — ECHOCARDIOGRAM COMPLETE
AR max vel: 3.15 cm2
AV Area VTI: 3.41 cm2
AV Area mean vel: 3.09 cm2
AV Mean grad: 3 mmHg
AV Peak grad: 6.1 mmHg
AV Vena cont: 0.36 cm
Ao pk vel: 1.23 m/s
Area-P 1/2: 3.27 cm2
S' Lateral: 3.18 cm

## 2024-05-21 ENCOUNTER — Ambulatory Visit (HOSPITAL_COMMUNITY): Admitting: Psychiatry
